# Patient Record
Sex: Male | Born: 1970 | Race: White | Hispanic: No | Marital: Married | Smoking: Never smoker
Health system: Southern US, Community
[De-identification: ages and names within clinical notes are randomized; demographics above are authoritative.]

## PROBLEM LIST (undated history)

## (undated) DIAGNOSIS — T7840XA Allergy, unspecified, initial encounter: Secondary | ICD-10-CM

## (undated) DIAGNOSIS — R600 Localized edema: Secondary | ICD-10-CM

## (undated) DIAGNOSIS — C4492 Squamous cell carcinoma of skin, unspecified: Secondary | ICD-10-CM

## (undated) DIAGNOSIS — N200 Calculus of kidney: Secondary | ICD-10-CM

## (undated) DIAGNOSIS — Z803 Family history of malignant neoplasm of breast: Secondary | ICD-10-CM

## (undated) DIAGNOSIS — Z8041 Family history of malignant neoplasm of ovary: Secondary | ICD-10-CM

## (undated) DIAGNOSIS — Z87442 Personal history of urinary calculi: Secondary | ICD-10-CM

## (undated) DIAGNOSIS — R5383 Other fatigue: Secondary | ICD-10-CM

## (undated) DIAGNOSIS — J309 Allergic rhinitis, unspecified: Secondary | ICD-10-CM

## (undated) DIAGNOSIS — L719 Rosacea, unspecified: Secondary | ICD-10-CM

## (undated) DIAGNOSIS — G589 Mononeuropathy, unspecified: Secondary | ICD-10-CM

## (undated) DIAGNOSIS — M25561 Pain in right knee: Secondary | ICD-10-CM

## (undated) DIAGNOSIS — S149XXA Injury of unspecified nerves of neck, initial encounter: Secondary | ICD-10-CM

## (undated) DIAGNOSIS — Z8489 Family history of other specified conditions: Secondary | ICD-10-CM

## (undated) DIAGNOSIS — G4730 Sleep apnea, unspecified: Secondary | ICD-10-CM

## (undated) HISTORY — DX: Rosacea, unspecified: L71.9

## (undated) HISTORY — DX: Mononeuropathy, unspecified: G58.9

## (undated) HISTORY — DX: Calculus of kidney: N20.0

## (undated) HISTORY — DX: Other fatigue: R53.83

## (undated) HISTORY — PX: LITHOTRIPSY: SUR834

## (undated) HISTORY — DX: Allergic rhinitis, unspecified: J30.9

## (undated) HISTORY — DX: Allergy, unspecified, initial encounter: T78.40XA

## (undated) HISTORY — PX: OTHER SURGICAL HISTORY: SHX169

## (undated) HISTORY — DX: Pain in right knee: M25.561

## (undated) HISTORY — DX: Squamous cell carcinoma of skin, unspecified: C44.92

## (undated) HISTORY — DX: Localized edema: R60.0

## (undated) HISTORY — DX: Family history of malignant neoplasm of breast: Z80.3

## (undated) HISTORY — DX: Sleep apnea, unspecified: G47.30

## (undated) HISTORY — DX: Family history of malignant neoplasm of ovary: Z80.41

## (undated) HISTORY — DX: Injury of unspecified nerves of neck, initial encounter: S14.9XXA

---

## 2012-11-20 ENCOUNTER — Ambulatory Visit (INDEPENDENT_AMBULATORY_CARE_PROVIDER_SITE_OTHER): Payer: Self-pay | Admitting: Internal Medicine

## 2012-11-20 ENCOUNTER — Encounter: Payer: Self-pay | Admitting: Internal Medicine

## 2012-11-20 VITALS — BP 122/72 | HR 62 | Temp 98.2°F | Ht 71.0 in | Wt 218.0 lb

## 2012-11-20 DIAGNOSIS — Z0000 Encounter for general adult medical examination without abnormal findings: Secondary | ICD-10-CM | POA: Insufficient documentation

## 2012-11-20 LAB — COMPREHENSIVE METABOLIC PANEL
ALT: 31 U/L (ref 0–53)
AST: 33 U/L (ref 0–37)
Albumin: 4.2 g/dL (ref 3.5–5.2)
Alkaline Phosphatase: 61 U/L (ref 39–117)
BUN: 15 mg/dL (ref 6–23)
CO2: 25 mEq/L (ref 19–32)
Calcium: 8.8 mg/dL (ref 8.4–10.5)
Chloride: 105 mEq/L (ref 96–112)
Creatinine, Ser: 1 mg/dL (ref 0.4–1.5)
GFR: 83.74 mL/min (ref >60.00)
Glucose, Bld: 90 mg/dL (ref 70–99)
Potassium: 3.8 mEq/L (ref 3.5–5.1)
Sodium: 139 mEq/L (ref 135–145)
Total Bilirubin: 0.5 mg/dL (ref 0.3–1.2)
Total Protein: 6.8 g/dL (ref 6.0–8.3)

## 2012-11-20 LAB — CBC WITH DIFFERENTIAL/PLATELET
Basophils Absolute: 0 10*3/uL (ref 0.0–0.1)
Basophils Relative: 0.4 % (ref 0.0–3.0)
Eosinophils Absolute: 0.1 10*3/uL (ref 0.0–0.7)
Eosinophils Relative: 1.9 % (ref 0.0–5.0)
HCT: 41.8 % (ref 39.0–52.0)
Hemoglobin: 14.6 g/dL (ref 13.0–17.0)
Lymphocytes Relative: 34.6 % (ref 12.0–46.0)
Lymphs Abs: 1.8 10*3/uL (ref 0.7–4.0)
MCHC: 34.8 g/dL (ref 30.0–36.0)
MCV: 89.5 fl (ref 78.0–100.0)
Monocytes Absolute: 0.3 10*3/uL (ref 0.1–1.0)
Monocytes Relative: 5.4 % (ref 3.0–12.0)
Neutro Abs: 3 10*3/uL (ref 1.4–7.7)
Neutrophils Relative %: 57.7 % (ref 43.0–77.0)
Platelets: 153 10*3/uL (ref 150.0–400.0)
RBC: 4.67 Mil/uL (ref 4.22–5.81)
RDW: 13.8 % (ref 11.5–14.6)
WBC: 5.1 10*3/uL (ref 4.5–10.5)

## 2012-11-20 LAB — LIPID PANEL
Cholesterol: 195 mg/dL (ref 0–200)
HDL: 27.2 mg/dL — ABNORMAL LOW (ref >39.00)
Total CHOL/HDL Ratio: 7
Triglycerides: 234 mg/dL — ABNORMAL HIGH (ref 0.0–149.0)
VLDL: 46.8 mg/dL — ABNORMAL HIGH (ref 0.0–40.0)

## 2012-11-20 LAB — TSH: TSH: 1.72 u[IU]/mL (ref 0.35–5.50)

## 2012-11-20 LAB — LDL CHOLESTEROL, DIRECT: Direct LDL: 122.5 mg/dL

## 2012-11-20 NOTE — Assessment & Plan Note (Addendum)
Td ~ 2009 per pt Never had a cscope Diet-exercise: discussed Labs occ drops of blood per rectum, will get a IFOB, if + or if he has anemia will need further eval, will call if sx increase  STE

## 2012-11-20 NOTE — Progress Notes (Signed)
  Subjective:    Patient ID: Cameron Valenzuela, male    DOB: April 07, 1971, 42 y.o.   MRN: [id number]  HPI CPX, concerned about FH of DM  Past Medical History  Diagnosis Date  . Rosacea   . Allergic rhinitis    Past Surgical History  Procedure Laterality Date  . Neck bipsy      beningn, in his 81s   History   Social History  . Marital Status: Married    Spouse Name: N/A    Number of Children: 1  . Years of Education: N/A   Occupational History  . hotel manager     Social History Main Topics  . Smoking status: Never Smoker   . Smokeless tobacco: Never Used  . Alcohol Use: Yes     Comment: rarely  . Drug Use: No  . Sexually Active: Not on file   Other Topics Concern  . Not on file   Social History Narrative   Diet: needs improvement   Exercise: no much lately   Family History  Problem Relation Age of Onset  . Diabetes Other     many fam members   . Hyperlipidemia Mother     M and others  . CAD Other     GF, GM (late onset)  . Colon cancer Neg Hx   . Prostate cancer Neg Hx      Review of Systems  Respiratory: Negative for cough and shortness of breath.   Cardiovascular: Negative for chest pain and leg swelling.  Gastrointestinal: Negative for abdominal pain and diarrhea.       Occ anal pain-itching, rarely sees drops of blood in the toilette paper  Genitourinary: Negative for dysuria, hematuria and difficulty urinating.  Psychiatric/Behavioral:       No depression-anxiety       Objective:   Physical Exam BP 122/72  Pulse 62  Temp(Src) 98.2 F (36.8 C) (Oral)  Ht 5\' 11"  (1.803 m)  Wt 218 lb (98.884 kg)  BMI 30.42 kg/m2  SpO2 98%  General -- alert, well-developed  Neck --no thyromegaly Lungs -- normal respiratory effort, no intercostal retractions, no accessory muscle use, and normal breath sounds.   Heart-- normal rate, regular rhythm, no murmur, and no gallop.   Abdomen--soft, non-tender, no distention, no masses, no HSM, no guarding, and no  rigidity.   Extremities-- no pretibial edema bilaterally  Neurologic-- alert & oriented X3 and strength normal in all extremities. Psych-- Cognition and judgment appear intact. Alert and cooperative with normal attention span and concentration.  not anxious appearing and not depressed appearing.       Assessment & Plan:

## 2012-11-25 ENCOUNTER — Encounter: Payer: Self-pay | Admitting: *Deleted

## 2015-01-25 ENCOUNTER — Telehealth: Payer: Self-pay | Admitting: Internal Medicine

## 2015-01-25 NOTE — Telephone Encounter (Signed)
pre visit letter mailed 01/24/15

## 2015-02-10 ENCOUNTER — Encounter: Payer: Self-pay | Admitting: *Deleted

## 2015-02-10 ENCOUNTER — Telehealth: Payer: Self-pay | Admitting: *Deleted

## 2015-02-10 NOTE — Telephone Encounter (Signed)
Pre-Visit Call completed with patient and chart updated.   Pre-Visit Info documented in Specialty Comments under [employer].    

## 2015-02-14 ENCOUNTER — Ambulatory Visit (INDEPENDENT_AMBULATORY_CARE_PROVIDER_SITE_OTHER): Payer: Managed Care, Other (non HMO) | Admitting: Internal Medicine

## 2015-02-14 ENCOUNTER — Encounter: Payer: Self-pay | Admitting: Internal Medicine

## 2015-02-14 VITALS — BP 122/74 | HR 72 | Temp 98.2°F | Ht 71.0 in | Wt 235.1 lb

## 2015-02-14 DIAGNOSIS — M545 Low back pain: Secondary | ICD-10-CM

## 2015-02-14 DIAGNOSIS — E785 Hyperlipidemia, unspecified: Secondary | ICD-10-CM

## 2015-02-14 DIAGNOSIS — Z23 Encounter for immunization: Secondary | ICD-10-CM

## 2015-02-14 DIAGNOSIS — Z0000 Encounter for general adult medical examination without abnormal findings: Secondary | ICD-10-CM

## 2015-02-14 NOTE — Progress Notes (Signed)
Subjective:    Patient ID: Cameron Valenzuela, male    DOB: Dec 31, 1970, 44 y.o.   MRN: [id number]  DOS:  02/14/2015 Type of visit - description : CPX Interval history:  In general doing well.  Review of Systems  Constitutional: No fever. No chills. No unexplained wt changes. No unusual sweats  HEENT: No dental problems, no ear discharge, no facial swelling, no voice changes. No eye discharge, no eye  redness , no  intolerance to light   Respiratory: No wheezing , no  difficulty breathing. No cough , no mucus production  Cardiovascular: No CP, no leg swelling , no  Palpitations  GI: no nausea, no vomiting, no diarrhea , no  abdominal pain.  No blood in the stools. No dysphagia, no odynophagia    Endocrine: No polyphagia, no polyuria , no polydipsia  GU:  For 2 weeks in July he had left low back pain with some radiation to the testicular area, symptoms resolved after he started to drink more water. Denies any rash, groin swelling @ the groin. no dysuria, gross hematuria. Pain did not change with movements.  No urinary urgency, no frequency.  Musculoskeletal: No joint swellings or unusual aches or pains  Skin: No change in the color of the skin, palor , no  Rash  Allergic, immunologic: No environmental allergies , no  food allergies  Neurological: No dizziness no  syncope. No headaches. No diplopia, no slurred, no slurred speech, no motor deficits, no facial  Numbness  Hematological: No enlarged lymph nodes, no easy bruising , no unusual bleedings  Psychiatry: No suicidal ideas, no hallucinations, no beavior problems, no confusion.  No unusual/severe anxiety, no depression  Past Medical History  Diagnosis Date  . Rosacea   . Allergic rhinitis   . Allergy     seasonal    Past Surgical History  Procedure Laterality Date  . Neck bipsy      beningn, in his 70s    History   Social History  . Marital Status: Married    Spouse Name: N/A  . Number of Children: 1  .  Years of Education: N/A   Occupational History  . hotel manager     Social History Main Topics  . Smoking status: Never Smoker   . Smokeless tobacco: Never Used  . Alcohol Use: Yes     Comment: rarely  . Drug Use: No  . Sexual Activity: Not on file   Other Topics Concern  . Not on file   Social History Narrative   Household-- pt, wife, child and mother in law     Family History  Problem Relation Age of Onset  . Diabetes Other     many fam members   . Hyperlipidemia Mother     M and others  . Colon cancer Neg Hx   . Prostate cancer Neg Hx   . Diabetes Father   . COPD Father   . Heart disease Father     ?  [name] Heart disease Maternal Grandfather   . Heart disease Paternal Grandmother        Medication List    Notice  As of 02/14/2015  4:48 PM   You have not been prescribed any medications.         Objective:   Physical Exam BP 122/74 mmHg  Pulse 72  Temp(Src) 98.2 F (36.8 C) (Oral)  Ht 5\' 11"  (1.803 m)  Wt 235 lb 2 oz (106.652 kg)  BMI 32.81 kg/m2  SpO2 96% General:   Well developed, well nourished . NAD.  HEENT:  Normocephalic . Face symmetric, atraumatic Neck: No thyromegaly Lungs:  CTA B Normal respiratory effort, no intercostal retractions, no accessory muscle use. Heart: RRR,  no murmur.  no pretibial edema bilaterally  MSK: No TTP at the lumbar spine Abdomen:  Not distended, soft, non-tender. No rebound or rigidity. No mass,organomegaly Skin: Not pale. Not jaundice Neurologic:  alert & oriented X3.  Speech normal, gait appropriate for age and unassisted Psych--  Cognition and judgment appear intact.  Cooperative with normal attention span and concentration.  Behavior appropriate. No anxious or depressed appearing.    Assessment & Plan:

## 2015-02-14 NOTE — Patient Instructions (Signed)
Get your blood work before you leave . We also need a urine sample  Please consider counting calories using [employer]

## 2015-02-14 NOTE — Progress Notes (Signed)
Pre visit review using our clinic review tool, if applicable. No additional management support is needed unless otherwise documented below in the visit note. 

## 2015-02-14 NOTE — Assessment & Plan Note (Signed)
Td today Colon cancer screening: Not indicated, no history of previous colonoscopy, no family history Prostate cancer screening: Not indicated   Diet-exercise: Extensive discussion, recommend at least 3 hours of exercise weekly, calorie counting? Labs  Other issues: Back pain, resolved, check a UA

## 2015-02-15 LAB — URINALYSIS, ROUTINE W REFLEX MICROSCOPIC
Bilirubin Urine: NEGATIVE
Ketones, ur: NEGATIVE
Leukocytes, UA: NEGATIVE
Nitrite: NEGATIVE
Specific Gravity, Urine: 1.01 (ref 1.000–1.030)
Total Protein, Urine: NEGATIVE
Urine Glucose: NEGATIVE
Urobilinogen, UA: 0.2 (ref 0.0–1.0)
pH: 5.5 (ref 5.0–8.0)

## 2015-02-15 LAB — LDL CHOLESTEROL, DIRECT: Direct LDL: 87 mg/dL

## 2015-02-15 LAB — COMPREHENSIVE METABOLIC PANEL
ALT: 33 U/L (ref 0–53)
AST: 24 U/L (ref 0–37)
Albumin: 4.5 g/dL (ref 3.5–5.2)
Alkaline Phosphatase: 66 U/L (ref 39–117)
BUN: 12 mg/dL (ref 6–23)
CO2: 29 mEq/L (ref 19–32)
Calcium: 9.2 mg/dL (ref 8.4–10.5)
Chloride: 103 mEq/L (ref 96–112)
Creatinine, Ser: 0.99 mg/dL (ref 0.40–1.50)
GFR: 86.76 mL/min (ref >60.00)
Glucose, Bld: 78 mg/dL (ref 70–99)
Potassium: 3.8 mEq/L (ref 3.5–5.1)
Sodium: 138 mEq/L (ref 135–145)
Total Bilirubin: 0.6 mg/dL (ref 0.2–1.2)
Total Protein: 7.1 g/dL (ref 6.0–8.3)

## 2015-02-15 LAB — CBC WITH DIFFERENTIAL/PLATELET
Basophils Absolute: 0 10*3/uL (ref 0.0–0.1)
Basophils Relative: 0.5 % (ref 0.0–3.0)
Eosinophils Absolute: 0.1 10*3/uL (ref 0.0–0.7)
Eosinophils Relative: 1.7 % (ref 0.0–5.0)
HCT: 43.3 % (ref 39.0–52.0)
Hemoglobin: 14.8 g/dL (ref 13.0–17.0)
Lymphocytes Relative: 37.3 % (ref 12.0–46.0)
Lymphs Abs: 2.3 10*3/uL (ref 0.7–4.0)
MCHC: 34.3 g/dL (ref 30.0–36.0)
MCV: 91.2 fl (ref 78.0–100.0)
Monocytes Absolute: 0.3 10*3/uL (ref 0.1–1.0)
Monocytes Relative: 5.2 % (ref 3.0–12.0)
Neutro Abs: 3.4 10*3/uL (ref 1.4–7.7)
Neutrophils Relative %: 55.3 % (ref 43.0–77.0)
Platelets: 166 10*3/uL (ref 150.0–400.0)
RBC: 4.74 Mil/uL (ref 4.22–5.81)
RDW: 14.4 % (ref 11.5–15.5)
WBC: 6.1 10*3/uL (ref 4.0–10.5)

## 2015-02-15 LAB — LIPID PANEL
Cholesterol: 204 mg/dL — ABNORMAL HIGH (ref 0–200)
HDL: 28.8 mg/dL — ABNORMAL LOW (ref >39.00)
Total CHOL/HDL Ratio: 7
Triglycerides: 463 mg/dL — ABNORMAL HIGH (ref 0.0–149.0)

## 2015-02-15 LAB — HIV ANTIBODY (ROUTINE TESTING W REFLEX): HIV 1&2 Ab, 4th Generation: NONREACTIVE

## 2015-02-15 LAB — TSH: TSH: 3.7 u[IU]/mL (ref 0.35–4.50)

## 2015-02-21 NOTE — Addendum Note (Signed)
Addended by: Wilfrid Lund on: 02/21/2015 03:16 PM   Modules accepted: Orders

## 2015-07-17 DIAGNOSIS — C4492 Squamous cell carcinoma of skin, unspecified: Secondary | ICD-10-CM

## 2015-07-17 HISTORY — DX: Squamous cell carcinoma of skin, unspecified: C44.92

## 2015-09-26 ENCOUNTER — Telehealth: Payer: Self-pay | Admitting: Internal Medicine

## 2015-09-26 NOTE — Telephone Encounter (Signed)
LVM inquiring if patient received flu shot  °

## 2016-02-10 ENCOUNTER — Telehealth: Payer: Self-pay

## 2016-02-10 NOTE — Telephone Encounter (Signed)
Okay to use 2-15 minute slots for CPE at [employer]. Thank you.

## 2016-02-10 NOTE — Telephone Encounter (Signed)
-----   Message from Oneta Rack sent at 02/10/2016  8:11 AM EDT ----- Regarding: CPE  Contact: [phone] Patient would like to have her physical before October but after August 10th please advise if same day can be utilize?

## 2016-02-14 NOTE — Telephone Encounter (Signed)
Patient scheduled physical for 03/12/2016 at 1pm.

## 2016-02-17 ENCOUNTER — Encounter: Payer: Managed Care, Other (non HMO) | Admitting: Internal Medicine

## 2016-03-12 ENCOUNTER — Encounter: Payer: Self-pay | Admitting: Internal Medicine

## 2016-03-12 ENCOUNTER — Ambulatory Visit (INDEPENDENT_AMBULATORY_CARE_PROVIDER_SITE_OTHER): Payer: Managed Care, Other (non HMO) | Admitting: Internal Medicine

## 2016-03-12 VITALS — BP 102/68 | HR 65 | Temp 98.1°F | Resp 12 | Ht 71.0 in | Wt 220.5 lb

## 2016-03-12 DIAGNOSIS — Z0000 Encounter for general adult medical examination without abnormal findings: Secondary | ICD-10-CM

## 2016-03-12 DIAGNOSIS — Z09 Encounter for follow-up examination after completed treatment for conditions other than malignant neoplasm: Secondary | ICD-10-CM | POA: Insufficient documentation

## 2016-03-12 DIAGNOSIS — R109 Unspecified abdominal pain: Secondary | ICD-10-CM | POA: Diagnosis not present

## 2016-03-12 LAB — URINALYSIS, ROUTINE W REFLEX MICROSCOPIC
Bilirubin Urine: NEGATIVE
Ketones, ur: NEGATIVE
Leukocytes, UA: NEGATIVE
Nitrite: NEGATIVE
Specific Gravity, Urine: 1.02 (ref 1.000–1.030)
Urine Glucose: NEGATIVE
Urobilinogen, UA: 1 (ref 0.0–1.0)
pH: 6 (ref 5.0–8.0)

## 2016-03-12 LAB — LIPID PANEL
Cholesterol: 212 mg/dL — ABNORMAL HIGH (ref 0–200)
HDL: 27.4 mg/dL — ABNORMAL LOW (ref >39.00)
LDL Cholesterol: 147 mg/dL — ABNORMAL HIGH (ref 0–99)
NonHDL: 184.45
Total CHOL/HDL Ratio: 8
Triglycerides: 187 mg/dL — ABNORMAL HIGH (ref 0.0–149.0)
VLDL: 37.4 mg/dL (ref 0.0–40.0)

## 2016-03-12 LAB — TSH: TSH: 2.25 u[IU]/mL (ref 0.35–4.50)

## 2016-03-12 NOTE — Assessment & Plan Note (Signed)
Left back- abdominal pain: Had  similar c/o last year, at that time the discomfort was getting closer to the GU area; now pain is mostly by the left side of the back and left lower quadrant. ROS is actually negative otherwise. MSK issue? Others?. Since this is an ongoing sx will start with an abdominal ultrasound particularly to look into the kidney. Also get a UA, urine culture. If test negative and symptoms continue will consider CT. RTC 1 year

## 2016-03-12 NOTE — Progress Notes (Signed)
Pre visit review using our clinic review tool, if applicable. No additional management support is needed unless otherwise documented below in the visit note. 

## 2016-03-12 NOTE — Assessment & Plan Note (Addendum)
Td 2016 Colon cancer screening: Not indicated, no history of previous colonoscopy, no family history Prostate cancer screening: Not indicated   Diet-exercise: Doing better. Labs reviewed, will check FLP, TSH, UA, urine culture

## 2016-03-12 NOTE — Progress Notes (Signed)
Subjective:    Patient ID: Cameron Valenzuela, male    DOB: 1971-04-13, 45 y.o.   MRN: [id number]  DOS:  03/12/2016 Type of visit - description : Complete physical exam Interval history: In general feeling well, trying to be more active and eat healthier. Has lost some weight.  Review of Systems Constitutional: No fever. No chills. No unexplained wt changes. No unusual sweats  HEENT: No dental problems, no ear discharge, no facial swelling, no voice changes. No eye discharge, no eye  redness , no  intolerance to light   Respiratory: No wheezing , no  difficulty breathing. No cough , no mucus production  Cardiovascular: No CP, no leg swelling , no  Palpitations  GI: no nausea, no vomiting, no diarrhea . Continue with the ill-defined pain at the left side: From the spine radiated anteriorly to the left lower quadrant. Otherwise review systems is negative. Pain is somewhat better when he pushed his abdomen up No blood in the stools. No dysphagia, no odynophagia    Endocrine: No polyphagia, no polyuria , no polydipsia  GU: No dysuria, gross hematuria, difficulty urinating. No urinary urgency, no frequency.  Musculoskeletal: No joint swellings or unusual aches or pains  Skin: No change in the color of the skin, palor , no  Rash  Allergic, immunologic: No environmental allergies , no  food allergies  Neurological: No dizziness no  syncope. No headaches. No diplopia, no slurred, no slurred speech, no motor deficits, no facial  Numbness  Hematological: No enlarged lymph nodes, no easy bruising , no unusual bleedings  Psychiatry: No suicidal ideas, no hallucinations, no beavior problems, no confusion.  No unusual/severe anxiety, no depression   Past Medical History:  Diagnosis Date  . Allergic rhinitis   . Allergy    seasonal  . Rosacea     Past Surgical History:  Procedure Laterality Date  . neck bipsy     beningn, in his 55s    Social History   Social History  .  Marital status: Married    Spouse name: Vaughan Basta  . Number of children: 1  . Years of education: N/A   Occupational History  . hotel manager     Social History Main Topics  . Smoking status: Never Smoker  . Smokeless tobacco: Never Used  . Alcohol use Yes     Comment: rarely  . Drug use: No  . Sexual activity: Not on file   Other Topics Concern  . Not on file   Social History Narrative   Household-- pt, wife, child and mother in law     Family History  Problem Relation Age of Onset  . Diabetes Father   . COPD Father   . Heart disease Father     ?  [name] Heart disease Maternal Grandfather   . Heart disease Paternal Grandmother   . Diabetes Other     many fam members   . Hyperlipidemia Mother     M and others  . Colon cancer Neg Hx   . Prostate cancer Neg Hx        Medication List    as of 03/12/2016  5:56 PM   You have not been prescribed any medications.        Objective:   Physical Exam  Abdominal:    Musculoskeletal:       Arms:  BP 102/68 (BP Location: Left Arm, Patient Position: Sitting, Cuff Size: Normal)   Pulse 65   Temp 98.1 F (  36.7 C) (Oral)   Resp 12   Ht 5\' 11"  (1.803 m)   Wt 220 lb 8 oz (100 kg)   SpO2 96%   BMI 30.75 kg/m   General:   Well developed, well nourished . NAD.  Neck: No  thyromegaly  HEENT:  Normocephalic . Face symmetric, atraumatic Lungs:  CTA B Normal respiratory effort, no intercostal retractions, no accessory muscle use. Heart: RRR,  no murmur.  No pretibial edema bilaterally  Abdomen:  Not distended, soft, slightly TTP without mass or rebound at the left side. See graphic.   Skin: Exposed areas without rash. Not pale. Not jaundice GU: Penis and scrotal contents normal. No inguinal hernias. Neurologic:  alert & oriented X3.  Speech normal, gait appropriate for age and unassisted Strength symmetric and appropriate for age.  Psych: Cognition and judgment appear intact.  Cooperative with normal attention span  and concentration.  Behavior appropriate. No anxious or depressed appearing.    Assessment & Plan:   Assessment Seasonal allergies Rosacea  PLAN: Left back- abdominal pain: Had  similar c/o last year, at that time the discomfort was getting closer to the GU area; now pain is mostly by the left side of the back and left lower quadrant. ROS is actually negative otherwise. MSK issue? Others?. Since this is an ongoing sx will start with an abdominal ultrasound particularly to look into the kidney. Also get a UA, urine culture. If test negative and symptoms continue will consider CT. RTC 1 year

## 2016-03-12 NOTE — Patient Instructions (Signed)
GO TO THE LAB : Get the blood work and a urine sample    GO TO THE FRONT DESK Schedule your next appointment for a physical exam in 1 year   If the pain is not gradually improving, please let me know

## 2016-03-13 LAB — URINE CULTURE: Organism ID, Bacteria: NO GROWTH

## 2016-03-17 ENCOUNTER — Ambulatory Visit (HOSPITAL_BASED_OUTPATIENT_CLINIC_OR_DEPARTMENT_OTHER)
Admission: RE | Admit: 2016-03-17 | Discharge: 2016-03-17 | Disposition: A | Discharge status: Home / Self Care | Payer: Managed Care, Other (non HMO) | Source: Ambulatory Visit | Attending: Internal Medicine | Admitting: Internal Medicine

## 2016-03-17 DIAGNOSIS — N281 Cyst of kidney, acquired: Secondary | ICD-10-CM | POA: Insufficient documentation

## 2016-03-17 DIAGNOSIS — R109 Unspecified abdominal pain: Secondary | ICD-10-CM | POA: Insufficient documentation

## 2016-03-17 DIAGNOSIS — K760 Fatty (change of) liver, not elsewhere classified: Secondary | ICD-10-CM | POA: Diagnosis not present

## 2016-04-24 ENCOUNTER — Encounter: Payer: Managed Care, Other (non HMO) | Admitting: Internal Medicine

## 2016-07-11 ENCOUNTER — Emergency Department (HOSPITAL_BASED_OUTPATIENT_CLINIC_OR_DEPARTMENT_OTHER): Payer: Managed Care, Other (non HMO)

## 2016-07-11 ENCOUNTER — Emergency Department (HOSPITAL_BASED_OUTPATIENT_CLINIC_OR_DEPARTMENT_OTHER)
Admission: EM | Admit: 2016-07-11 | Discharge: 2016-07-11 | Disposition: A | Discharge status: Home / Self Care | Payer: Managed Care, Other (non HMO) | Attending: Emergency Medicine | Admitting: Emergency Medicine

## 2016-07-11 ENCOUNTER — Encounter (HOSPITAL_BASED_OUTPATIENT_CLINIC_OR_DEPARTMENT_OTHER): Payer: Self-pay | Admitting: *Deleted

## 2016-07-11 DIAGNOSIS — R1032 Left lower quadrant pain: Secondary | ICD-10-CM | POA: Diagnosis present

## 2016-07-11 DIAGNOSIS — N23 Unspecified renal colic: Secondary | ICD-10-CM

## 2016-07-11 DIAGNOSIS — N200 Calculus of kidney: Secondary | ICD-10-CM

## 2016-07-11 LAB — CBC WITH DIFFERENTIAL/PLATELET
Basophils Absolute: 0 10*3/uL (ref 0.0–0.1)
Basophils Relative: 0 %
Eosinophils Absolute: 0 10*3/uL (ref 0.0–0.7)
Eosinophils Relative: 0 %
HCT: 42.5 % (ref 39.0–52.0)
Hemoglobin: 14.3 g/dL (ref 13.0–17.0)
Lymphocytes Relative: 9 %
Lymphs Abs: 1.1 10*3/uL (ref 0.7–4.0)
MCH: 31.1 pg (ref 26.0–34.0)
MCHC: 33.6 g/dL (ref 30.0–36.0)
MCV: 92.4 fL (ref 78.0–100.0)
Monocytes Absolute: 0.3 10*3/uL (ref 0.1–1.0)
Monocytes Relative: 2 %
Neutro Abs: 11.1 10*3/uL — ABNORMAL HIGH (ref 1.7–7.7)
Neutrophils Relative %: 89 %
Platelets: 207 10*3/uL (ref 150–400)
RBC: 4.6 MIL/uL (ref 4.22–5.81)
RDW: 14.5 % (ref 11.5–15.5)
WBC: 12.5 10*3/uL — ABNORMAL HIGH (ref 4.0–10.5)

## 2016-07-11 LAB — URINALYSIS, MICROSCOPIC (REFLEX): WBC, UA: NONE SEEN WBC/hpf (ref 0–5)

## 2016-07-11 LAB — URINALYSIS, ROUTINE W REFLEX MICROSCOPIC
Bilirubin Urine: NEGATIVE
Glucose, UA: NEGATIVE mg/dL
Ketones, ur: NEGATIVE mg/dL
Leukocytes, UA: NEGATIVE
Nitrite: NEGATIVE
Protein, ur: 30 mg/dL — AB
Specific Gravity, Urine: 1.025 (ref 1.005–1.030)
pH: 5 (ref 5.0–8.0)

## 2016-07-11 LAB — COMPREHENSIVE METABOLIC PANEL
ALT: 33 U/L (ref 17–63)
AST: 30 U/L (ref 15–41)
Albumin: 4.6 g/dL (ref 3.5–5.0)
Alkaline Phosphatase: 59 U/L (ref 38–126)
Anion gap: 8 (ref 5–15)
BUN: 20 mg/dL (ref 6–20)
CO2: 24 mmol/L (ref 22–32)
Calcium: 9 mg/dL (ref 8.9–10.3)
Chloride: 106 mmol/L (ref 101–111)
Creatinine, Ser: 1.04 mg/dL (ref 0.61–1.24)
GFR calc Af Amer: >60 mL/min (ref >60)
GFR calc non Af Amer: >60 mL/min (ref >60)
Glucose, Bld: 158 mg/dL — ABNORMAL HIGH (ref 65–99)
Potassium: 4 mmol/L (ref 3.5–5.1)
Sodium: 138 mmol/L (ref 135–145)
Total Bilirubin: 0.6 mg/dL (ref 0.3–1.2)
Total Protein: 7.3 g/dL (ref 6.5–8.1)

## 2016-07-11 MED ORDER — ONDANSETRON HCL 4 MG/2ML IJ SOLN
4.0000 mg | INTRAMUSCULAR | Status: DC | PRN
  Administered 2016-07-11: 4 mg via INTRAVENOUS
  Filled 2016-07-11: qty 2

## 2016-07-11 MED ORDER — OXYCODONE-ACETAMINOPHEN 5-325 MG PO TABS: 1.0000 | ORAL_TABLET | Freq: Four times a day (QID) | ORAL | 0 refills | Status: DC | PRN

## 2016-07-11 MED ORDER — ONDANSETRON 4 MG PO TBDP: 4.0000 mg | ORAL_TABLET | Freq: Three times a day (TID) | ORAL | 0 refills | Status: DC | PRN

## 2016-07-11 MED ORDER — KETOROLAC TROMETHAMINE 30 MG/ML IJ SOLN
30.0000 mg | Freq: Once | INTRAMUSCULAR | Status: AC
  Administered 2016-07-11: 30 mg via INTRAVENOUS
  Filled 2016-07-11: qty 1

## 2016-07-11 MED ORDER — IBUPROFEN 800 MG PO TABS: 800.0000 mg | ORAL_TABLET | Freq: Four times a day (QID) | ORAL | 0 refills | Status: AC

## 2016-07-11 MED ORDER — FENTANYL CITRATE (PF) 100 MCG/2ML IJ SOLN
50.0000 ug | INTRAMUSCULAR | Status: DC | PRN
  Administered 2016-07-11: 50 ug via INTRAVENOUS
  Filled 2016-07-11: qty 2

## 2016-07-11 NOTE — ED Notes (Signed)
Pt given d/c instructions as per chart. Verbalizes understanding. No questions. Rx  X 3

## 2016-07-11 NOTE — ED Notes (Signed)
ED Provider at bedside. 

## 2016-07-11 NOTE — ED Notes (Signed)
Pt sitting up on stretcher texting. No pain at present.

## 2016-07-11 NOTE — ED Notes (Signed)
Pt states he was at work this a.m. and developed left flank pain and nausea. Abd feels bloated. +hematuria

## 2016-07-11 NOTE — ED Triage Notes (Signed)
Pt c/o adb pain x 6 hrs and vomiting x 1 hr

## 2016-07-11 NOTE — ED Notes (Signed)
Patient transported to CT 

## 2016-07-11 NOTE — ED Provider Notes (Signed)
[place] [facility] Provider Note   CSN: [id number] Arrival date & time: 07/11/16  1445     History   Chief Complaint Chief Complaint  Patient presents with  . Abdominal Pain    HPI Cameron Valenzuela is a 45 y.o. male.  The history is provided by the patient.  Flank Pain  This is a recurrent problem. The current episode started 6 to 12 hours ago. The problem occurs constantly. The problem has been rapidly worsening. Associated symptoms include abdominal pain (left lower). Nothing aggravates the symptoms. Nothing relieves the symptoms. He has tried nothing for the symptoms.    Past Medical History:  Diagnosis Date  . Allergic rhinitis   . Allergy    seasonal  . Rosacea     Patient Active Problem List   Diagnosis Date Noted  . PCP NOTES >>>>>>>>>>>>>>>> 03/12/2016  . Annual physical exam 11/20/2012    Past Surgical History:  Procedure Laterality Date  . neck bipsy     beningn, in his 31s       Home Medications    Prior to Admission medications   Medication Sig Start Date End Date Taking? Authorizing Provider  ibuprofen (ADVIL,MOTRIN) 800 MG tablet Take 1 tablet (800 mg total) by mouth every 6 (six) hours. 07/11/16 07/16/16  Leo Grosser, MD  ondansetron (ZOFRAN ODT) 4 MG disintegrating tablet Take 1 tablet (4 mg total) by mouth every 8 (eight) hours as needed for nausea or vomiting. 07/11/16   Leo Grosser, MD  oxyCODONE-acetaminophen (PERCOCET/ROXICET) 5-325 MG tablet Take 1 tablet by mouth every 6 (six) hours as needed for severe pain. 07/11/16   Leo Grosser, MD    Family History Family History  Problem Relation Age of Onset  . Diabetes Father   . COPD Father   . Heart disease Father     ?  [name] Heart disease Maternal Grandfather   . Heart disease Paternal Grandmother   . Diabetes Other     many fam members   . Hyperlipidemia Mother     M and others  . Colon cancer Neg Hx   . Prostate cancer Neg Hx     Social History Social History    Substance Use Topics  . Smoking status: Never Smoker  . Smokeless tobacco: Never Used  . Alcohol use Yes     Comment: rarely     Allergies   Patient has no known allergies.   Review of Systems Review of Systems  Gastrointestinal: Positive for abdominal pain (left lower).  Genitourinary: Positive for flank pain.  All other systems reviewed and are negative.    Physical Exam Updated Vital Signs BP 142/86 (BP Location: Left Arm)   Pulse 62   Temp 98.3 F (36.8 C) (Oral)   Resp 20   Ht 5\' 10"  (1.778 m)   Wt 210 lb (95.3 kg)   SpO2 100%   BMI 30.13 kg/m   Physical Exam  Constitutional: He is oriented to person, place, and time. He appears well-developed and well-nourished. No distress.  HENT:  Head: Normocephalic and atraumatic.  Nose: Nose normal.  Eyes: Conjunctivae are normal.  Neck: Neck supple. No tracheal deviation present.  Cardiovascular: Normal rate and regular rhythm.   Pulmonary/Chest: Effort normal. No respiratory distress.  Abdominal: Soft. He exhibits no distension. There is tenderness (LLQ and left flank).  CVA tenderness on left  Neurological: He is alert and oriented to person, place, and time.  Skin: Skin is warm and dry.  Psychiatric: He  has a normal mood and affect.     ED Treatments / Results  Labs (all labs ordered are listed, but only abnormal results are displayed) Labs Reviewed  URINALYSIS, ROUTINE W REFLEX MICROSCOPIC - Abnormal; Notable for the following:       Result Value   APPearance CLOUDY (*)    Hgb urine dipstick LARGE (*)    Protein, ur 30 (*)    All other components within normal limits  URINALYSIS, MICROSCOPIC (REFLEX) - Abnormal; Notable for the following:    Bacteria, UA RARE (*)    Squamous Epithelial / LPF 0-5 (*)    All other components within normal limits  CBC WITH DIFFERENTIAL/PLATELET - Abnormal; Notable for the following:    WBC 12.5 (*)    Neutro Abs 11.1 (*)    All other components within normal limits   COMPREHENSIVE METABOLIC PANEL - Abnormal; Notable for the following:    Glucose, Bld 158 (*)    All other components within normal limits  URINE CULTURE    EKG  EKG Interpretation None       Radiology Ct Renal Stone Study  Result Date: 07/11/2016 CLINICAL DATA:  Left flank pain and hematuria EXAM: CT ABDOMEN AND PELVIS WITHOUT CONTRAST TECHNIQUE: Multidetector CT imaging of the abdomen and pelvis was performed following the standard protocol without IV contrast. COMPARISON:  None. FINDINGS: Lower chest: Lung bases clear.  Heart size within normal limits. Hepatobiliary: Fatty infiltration of liver without focal lesion. Gallbladder and bile ducts normal. Pancreas: Negative Spleen: Negative Adrenals/Urinary Tract: 12 x 8 mm stone left renal pelvis. There is mild dilatation of the renal pelvis with mild surrounding edema suggesting intermittent obstruction. Left ureter nondilated. Nonobstructing 2 x 3 mm stone left lower pole. 15 mm and 20 mm left renal cyst. Negative for hydronephrosis on the right. There is however a 2 x 4 mm stone in the distal right ureter. No other right renal calculi. Stomach/Bowel: Stomach and duodenum normal. Negative for bowel obstruction. Normal appendix. Negative for bowel edema or mass. Vascular/Lymphatic: Negative Reproductive: 23 mm low-density cystic structure just above the prostate likely associated with seminal vesicle. This is near the midline. Prostate mildly enlarged. Other: No free fluid.  Negative for hernia. Musculoskeletal: Negative IMPRESSION: 12 x 8 mm stone in the left renal pelvis. There is mild dilatation of pelvis with surrounding edema suggesting intermittent obstruction. Nonobstructing 2 x 3 mm stone left lower pole. 2 x 4 mm stone distal right ureter without significant obstruction. 23 mm low-density cystic structure just above the prostate likely associated with seminal vesicle. Electronically Signed   By: Franchot Gallo M.D.   On: 07/11/2016 16:56     Procedures Procedures (including critical care time)  Medications Ordered in ED Medications  ondansetron (ZOFRAN) injection 4 mg (4 mg Intravenous Given 07/11/16 1629)  fentaNYL (SUBLIMAZE) injection 50 mcg (50 mcg Intravenous Given 07/11/16 1717)  ketorolac (TORADOL) 30 MG/ML injection 30 mg (30 mg Intravenous Given 07/11/16 1632)     Initial Impression / Assessment and Plan / ED Course  I have reviewed the triage vital signs and the nursing notes.  Pertinent labs & imaging results that were available during my care of the patient were reviewed by me and considered in my medical decision making (see chart for details).  Clinical Course     45 y.o. male presents with left flank pain that became acutely worse today. He started vomiting d/t pain. Was undergoing outpatient workup with unremarkable renal US. CT was  planned if symptoms persisted and was performed revealing large proximal intermittently obstructing stone likely causing symptoms. Lower small right sided stone appears to be asymptomatic and not obstructive. Renal function is preserved. Pain and nausea well controlled here. No evidence of infection. Provided referral to urology as this size stone will likely require some type of intervention. Pain meds and nausea meds given for home control of symptoms until able to establish follow up.   Final Clinical Impressions(s) / ED Diagnoses   Final diagnoses:  Kidney stone on left side  Renal colic    New Prescriptions New Prescriptions   IBUPROFEN (ADVIL,MOTRIN) 800 MG TABLET    Take 1 tablet (800 mg total) by mouth every 6 (six) hours.   ONDANSETRON (ZOFRAN ODT) 4 MG DISINTEGRATING TABLET    Take 1 tablet (4 mg total) by mouth every 8 (eight) hours as needed for nausea or vomiting.   OXYCODONE-ACETAMINOPHEN (PERCOCET/ROXICET) 5-325 MG TABLET    Take 1 tablet by mouth every 6 (six) hours as needed for severe pain.     Leo Grosser, MD 07/12/16 [id number]

## 2016-07-12 ENCOUNTER — Other Ambulatory Visit: Payer: Self-pay | Admitting: Urology

## 2016-07-12 LAB — URINE CULTURE: Culture: NO GROWTH

## 2016-07-17 ENCOUNTER — Other Ambulatory Visit: Payer: Self-pay | Admitting: Urology

## 2016-07-24 NOTE — Patient Instructions (Signed)
Cameron Valenzuela  07/24/2016   Your procedure is scheduled on: 07/30/2016    Report to [facility] Main  Entrance take [place]  elevators to 3rd floor to  [place] at   [place] AM.  Call this number if you have problems the morning of surgery [phone]   Remember: ONLY 1 PERSON MAY GO WITH YOU TO SHORT STAY TO GET  READY MORNING OF [place].  Do not eat food or drink liquids :After Midnight.     Take these medicines the morning of surgery with A SIP OF WATER: none                                 You may not have any metal on your body including hair pins and              piercings  Do not wear jewelry,  lotions, powders or perfumes, deodorant                           Men may shave face and neck.   Do not bring valuables to the hospital. [place].  Contacts, dentures or bridgework may not be worn into surgery.       Patients discharged the day of surgery will not be allowed to drive home.  Name and phone number of your driver:  Special Instructions: N/A              Please read over the following fact sheets you were given: _____________________________________________________________________             [facility] - Preparing for Surgery Before surgery, you can play an important role.  Because skin is not sterile, your skin needs to be as free of germs as possible.  You can reduce the number of germs on your skin by washing with CHG (chlorahexidine gluconate) soap before surgery.  CHG is an antiseptic cleaner which kills germs and bonds with the skin to continue killing germs even after washing. Please DO NOT use if you have an allergy to CHG or antibacterial soaps.  If your skin becomes reddened/irritated stop using the CHG and inform your nurse when you arrive at Short Stay. Do not shave (including legs and underarms) for at least 48 hours prior to the first CHG shower.  You may shave your  face/neck. Please follow these instructions carefully:  1.  Shower with CHG Soap the night before surgery and the  morning of Surgery.  2.  If you choose to wash your hair, wash your hair first as usual with your  normal  shampoo.  3.  After you shampoo, rinse your hair and body thoroughly to remove the  shampoo.                           4.  Use CHG as you would any other liquid soap.  You can apply chg directly  to the skin and wash                       Gently with a scrungie or clean washcloth.  5.  Apply  the CHG Soap to your body ONLY FROM THE NECK DOWN.   Do not use on face/ open                           Wound or open sores. Avoid contact with eyes, ears mouth and genitals (private parts).                       Wash face,  Genitals (private parts) with your normal soap.             6.  Wash thoroughly, paying special attention to the area where your surgery  will be performed.  7.  Thoroughly rinse your body with warm water from the neck down.  8.  DO NOT shower/wash with your normal soap after using and rinsing off  the CHG Soap.                9.  Pat yourself dry with a clean towel.            10.  Wear clean pajamas.            11.  Place clean sheets on your bed the night of your first shower and do not  sleep with pets. Day of Surgery : Do not apply any lotions/deodorants the morning of surgery.  Please wear clean clothes to the hospital/surgery center.  FAILURE TO FOLLOW THESE INSTRUCTIONS MAY RESULT IN THE CANCELLATION OF YOUR SURGERY PATIENT SIGNATURE_________________________________  NURSE SIGNATURE__________________________________  ________________________________________________________________________

## 2016-07-25 ENCOUNTER — Encounter (HOSPITAL_COMMUNITY): Payer: Self-pay | Admitting: *Deleted

## 2016-07-25 ENCOUNTER — Encounter (HOSPITAL_COMMUNITY): Payer: Self-pay

## 2016-07-25 ENCOUNTER — Encounter (HOSPITAL_COMMUNITY)
Admission: RE | Admit: 2016-07-25 | Discharge: 2016-07-25 | Disposition: A | Discharge status: Home / Self Care | Payer: Managed Care, Other (non HMO) | Source: Ambulatory Visit | Attending: Urology | Admitting: Urology

## 2016-07-25 DIAGNOSIS — Z01812 Encounter for preprocedural laboratory examination: Secondary | ICD-10-CM | POA: Insufficient documentation

## 2016-07-25 HISTORY — DX: Family history of other specified conditions: Z84.89

## 2016-07-25 HISTORY — DX: Personal history of urinary calculi: Z87.442

## 2016-07-25 LAB — BASIC METABOLIC PANEL
Anion gap: 5 (ref 5–15)
BUN: 20 mg/dL (ref 6–20)
CO2: 31 mmol/L (ref 22–32)
Calcium: 9.3 mg/dL (ref 8.9–10.3)
Chloride: 105 mmol/L (ref 101–111)
Creatinine, Ser: 0.98 mg/dL (ref 0.61–1.24)
GFR calc Af Amer: >60 mL/min (ref >60)
GFR calc non Af Amer: >60 mL/min (ref >60)
Glucose, Bld: 89 mg/dL (ref 65–99)
Potassium: 4.3 mmol/L (ref 3.5–5.1)
Sodium: 141 mmol/L (ref 135–145)

## 2016-07-25 LAB — CBC
HCT: 42.5 % (ref 39.0–52.0)
Hemoglobin: 14.3 g/dL (ref 13.0–17.0)
MCH: 30.6 pg (ref 26.0–34.0)
MCHC: 33.6 g/dL (ref 30.0–36.0)
MCV: 91 fL (ref 78.0–100.0)
Platelets: 184 10*3/uL (ref 150–400)
RBC: 4.67 MIL/uL (ref 4.22–5.81)
RDW: 13.9 % (ref 11.5–15.5)
WBC: 5.6 10*3/uL (ref 4.0–10.5)

## 2016-07-30 ENCOUNTER — Ambulatory Visit (HOSPITAL_COMMUNITY): Admission: RE | Admit: 2016-07-30 | Payer: Managed Care, Other (non HMO) | Source: Ambulatory Visit | Admitting: Urology

## 2016-07-30 SURGERY — LITHOTRIPSY, ESWL
Anesthesia: LOCAL | Laterality: Left

## 2016-07-30 SURGERY — CYSTOSCOPY, FLEXIBLE, WITH STENT REPLACEMENT
Anesthesia: General | Laterality: Left

## 2016-07-30 SURGERY — LITHOTRIPSY, ESWL
Anesthesia: General | Laterality: Left

## 2016-07-30 NOTE — H&P (Signed)
HPI: Cameron Valenzuela is a 46 year-old male with a left renal pelvis stone.  The problem is on the left side. His symptoms include flank pain. Patient denies having back pain, groin pain, nausea, vomiting, fever, chills, and blood in urine. He first began experiencing symptoms 07/11/2016. This is his first kidney stone.   He has never had surgical treatment for calculi in the past.   He was seen in the emergency room on 07/11/16 with intermittent pain in the left lower quadrant. The pain was severe when it occurred and constant. A CT scan was obtained which revealed a 12 x 8 mm stone in the renal pelvis with Hounsfield units of 1000 as well as a smaller stone in the lower pole of the left kidney that was nonobstructing. In addition he had a 2.5 mm stone seen in the distal right ureter on the right hand side with no right renal calculi.   He said that he had intermittently had some pain in the back for about 6 months although it had been mild. Recently though he developed increased pressure-type sensation with nausea that has also been intermittent. It did become severe and he went to the ER where he was diagnosed with his left renal pelvic stone as well as his right ureteral stone. He has no prior history of stones. He has seen some gross hematuria intermittently. He has no change in his voiding pattern. There is no family history of kidney stones. He is not having any discomfort at this time. He did undergo a renal ultrasound several months ago but apparently no stone was identified area he was found to have a renal cyst.  He passed his right distal ureteral stone last night and he caught this in his strainer.     ALLERGIES: None   MEDICATIONS: Gas-X  Ibuprofen 800 mg tablet     GU PSH: None   NON-GU PSH: None   GU PMH: None   NON-GU PMH: None   FAMILY HISTORY: 1 Daughter - Other cardiac disorder - Runs in Family Diabetes - Runs in Family   SOCIAL HISTORY: Marital Status:  Married Current Smoking Status: Patient has never smoked.   Tobacco Use Assessment Completed: Used Tobacco in last 30 days? Social Drinker.  Drinks 3 caffeinated drinks per day. Patient's occupation Scientist, research (life sciences).    REVIEW OF SYSTEMS:    GU Review Male:   Patient denies frequent urination, hard to postpone urination, burning/ pain with urination, get up at night to urinate, leakage of urine, stream starts and stops, trouble starting your stream, have to strain to urinate , erection problems, and penile pain.  Gastrointestinal (Upper):   Patient reports nausea and vomiting. Patient denies indigestion/ heartburn.  Gastrointestinal (Lower):   Patient denies diarrhea and constipation.  Constitutional:   Patient denies fever, night sweats, weight loss, and fatigue.  Skin:   Patient denies skin rash/ lesion and itching.  Eyes:   Patient denies double vision and blurred vision.  Ears/ Nose/ Throat:   Patient denies sore throat and sinus problems.  Hematologic/Lymphatic:   Patient denies swollen glands and easy bruising.  Cardiovascular:   Patient denies leg swelling and chest pains.  Respiratory:   Patient denies cough and shortness of breath.  Endocrine:   Patient denies excessive thirst.  Musculoskeletal:   Patient reports back pain. Patient denies joint pain.  Neurological:   Patient denies headaches and dizziness.  Psychologic:   Patient denies depression and anxiety.   VITAL SIGNS:  Weight 210 lb / 95.25 kg  Height 70 in / 177.8 cm  BP 123/77 mmHg  Pulse 70 /min  Temperature 97.8 F / 37 C  BMI 30.1 kg/m   MULTI-SYSTEM PHYSICAL EXAMINATION:    Constitutional: Well-nourished. No physical deformities. Normally developed. Good grooming.  Neck: Neck symmetrical, not swollen. Normal tracheal position.  Respiratory: No labored breathing, no use of accessory muscles.   Cardiovascular: Normal temperature, normal extremity pulses, no swelling, no varicosities.  Lymphatic: No  enlargement of neck, axillae, groin.  Skin: No paleness, no jaundice, no cyanosis. No lesion, no ulcer, no rash.  Neurologic / Psychiatric: Oriented to time, oriented to place, oriented to person. No depression, no anxiety, no agitation.  Gastrointestinal: No mass, no tenderness, no rigidity, non obese abdomen.  Eyes: Normal conjunctivae. Normal eyelids.  Ears, Nose, Mouth, and Throat: Left ear no scars, no lesions, no masses. Right ear no scars, no lesions, no masses. Nose no scars, no lesions, no masses. Normal hearing. Normal lips.  Musculoskeletal: Normal gait and station of head and neck.     PAST DATA REVIEWED:  Source Of History:  Patient  Lab Test Review:   BUN/Creatinine, Calcium  Records Review:   Previous Hospital Records, POC Tool  X-Ray Review: C.T. Stone Protocol: Reviewed Films. Reviewed Report. Discussed With Patient. In addition to his left renal pelvic stone he was noted to have a 3 mm left renal calculus in the lower pole.   Notes:                     On 07/11/16 he was found to have a normal creatinine of 1.04 with a normal serum calcium of 9.0   PROCEDURES:          Urinalysis w/Scope Dipstick Dipstick Cont'd Micro  Color: Yellow Bilirubin: Neg WBC/hpf: 0 - 5/hpf  Appearance: Clear Ketones: Neg RBC/hpf: 3 - 10/hpf  Specific Gravity: 1.020 Blood: 2+ Bacteria: NS (Not Seen)  pH: 5.5 Protein: Neg Cystals: NS (Not Seen)  Glucose: Neg Urobilinogen: 0.2 Casts: NS (Not Seen)    Nitrites: Neg Trichomonas: Not Present    Leukocyte Esterase: Neg Mucous: Not Present      Epithelial Cells: 0 - 5/hpf      Yeast: NS (Not Seen)      Sperm: Not Present    ASSESSMENT:      ICD-10 Details  1 GU:   Kidney Stone - N20.0 Left, He has a 12 x 8 mm stone in the left renal pelvis without evidence of obstruction on his CT scan. After discussing each of the treatment options he has elected to proceed with lithotripsy and also has elected to undergo a stent placement due to the fairly  large size of his stone. He therefore will undergo a left double-J stent placement and then lithotripsy.  2   Renal Cysts, Simple - N28.1 Left, He was noted to have left renal cysts on his CT scan done on 07/11/16. These were confirmed to be simple cyst by his previous renal ultrasound.  3   Calculus Ureter - N20.1 Right, Acute - A 2.5 mm stone was identified in his distal right ureter on his CT scan as well. It was not causing obstruction. He has passed that stone.          Notes:   We discussed the management of urinary stones. These options include observation, ureteroscopy, shockwave lithotripsy, and PCNL. We discussed which options are relevant to these particular stones. We  discussed the natural history of stones as well as the complications of untreated stones and the impact on quality of life without treatment as well as with each of the above listed treatments. We also discussed the efficacy of each treatment in its ability to clear the stone burden. With any of these management options I discussed the signs and symptoms of infection and the need for emergent treatment should these be experienced. For each option we discussed the ability of each procedure to clear the patient of their stone burden.   For observation I described the risks which include but are not limited to silent renal damage, life-threatening infection, need for emergent surgery, failure to pass stone, and pain.   For ureteroscopy I described the risks which include heart attack, stroke, pulmonary embolus, death, bleeding, infection, damage to contiguous structures, positioning injury, ureteral stricture, ureteral avulsion, ureteral injury, need for ureteral stent, inability to perform ureteroscopy, need for an interval procedure, inability to clear stone burden, stent discomfort and pain.   For shockwave lithotripsy I described the risks which include arrhythmia, kidney contusion, kidney hemorrhage, need for transfusion,  long-term risk of diabetes or hypertension, back discomfort, flank ecchymosis, flank abrasion, inability to break up stone, inability to pass stone fragments, Steinstrasse, infection associated with obstructing stones, need for different surgical procedure and possible need for repeat shockwave lithotripsy.      PLAN: 1.  Cystoscopy with left double-J stent placement. 2.  Left ESWL

## 2017-02-01 ENCOUNTER — Other Ambulatory Visit: Payer: Self-pay | Admitting: Urology

## 2017-02-05 NOTE — Patient Instructions (Addendum)
Cameron Valenzuela  02/05/2017   Your procedure is scheduled on: 02/18/17  Report to [facility] Main  Entrance Take [place]  elevators to 3rd floor to  [place] at      [place]    AM.    Call this number if you have problems the morning of surgery [phone]    Remember: ONLY 1 PERSON MAY GO WITH YOU TO SHORT STAY TO GET  READY MORNING OF [place].  Do not eat food or drink liquids :After Midnight.     Take these medicines the morning of surgery with A SIP OF WATER: NONE                                You may not have any metal on your body including hair pins and              piercings  Do not wear jewelry, make-up, lotions, powders or perfumes, deodorant                       Men may shave face and neck.   Do not bring valuables to the hospital. [place].  Contacts, dentures or bridgework may not be worn into surgery.      Patients discharged the day of surgery will not be allowed to drive home.  Name and phone number of your driver:  Special Instructions: N/A              Please read over the following fact sheets you were given: _____________________________________________________________________             [facility] - Preparing for Surgery Before surgery, you can play an important role.  Because skin is not sterile, your skin needs to be as free of germs as possible.  You can reduce the number of germs on your skin by washing with CHG (chlorahexidine gluconate) soap before surgery.  CHG is an antiseptic cleaner which kills germs and bonds with the skin to continue killing germs even after washing. Please DO NOT use if you have an allergy to CHG or antibacterial soaps.  If your skin becomes reddened/irritated stop using the CHG and inform your nurse when you arrive at Short Stay. Do not shave (including legs and underarms) for at least 48 hours prior to the first CHG shower.  You may shave  your face/neck. Please follow these instructions carefully:  1.  Shower with CHG Soap the night before surgery and the  morning of Surgery.  2.  If you choose to wash your hair, wash your hair first as usual with your  normal  shampoo.  3.  After you shampoo, rinse your hair and body thoroughly to remove the  shampoo.                           4.  Use CHG as you would any other liquid soap.  You can apply chg directly  to the skin and wash                       Gently with a scrungie or clean washcloth.  5.  Apply the CHG Soap to your body ONLY FROM THE NECK DOWN.   Do not use on face/ open                           Wound or open sores. Avoid contact with eyes, ears mouth and genitals (private parts).                       Wash face,  Genitals (private parts) with your normal soap.             6.  Wash thoroughly, paying special attention to the area where your surgery  will be performed.  7.  Thoroughly rinse your body with warm water from the neck down.  8.  DO NOT shower/wash with your normal soap after using and rinsing off  the CHG Soap.                9.  Pat yourself dry with a clean towel.            10.  Wear clean pajamas.            11.  Place clean sheets on your bed the night of your first shower and do not  sleep with pets. Day of Surgery : Do not apply any lotions/deodorants the morning of surgery.  Please wear clean clothes to the hospital/surgery center.  FAILURE TO FOLLOW THESE INSTRUCTIONS MAY RESULT IN THE CANCELLATION OF YOUR SURGERY PATIENT SIGNATURE_________________________________  NURSE SIGNATURE__________________________________  ________________________________________________________________________

## 2017-02-06 ENCOUNTER — Encounter (HOSPITAL_COMMUNITY)
Admission: RE | Admit: 2017-02-06 | Discharge: 2017-02-06 | Disposition: A | Discharge status: Home / Self Care | Payer: BLUE CROSS/BLUE SHIELD | Source: Ambulatory Visit | Attending: Urology | Admitting: Urology

## 2017-02-06 ENCOUNTER — Encounter (HOSPITAL_COMMUNITY): Payer: Self-pay

## 2017-02-06 DIAGNOSIS — Z01812 Encounter for preprocedural laboratory examination: Secondary | ICD-10-CM | POA: Insufficient documentation

## 2017-02-06 LAB — BASIC METABOLIC PANEL
Anion gap: 6 (ref 5–15)
BUN: 20 mg/dL (ref 6–20)
CO2: 26 mmol/L (ref 22–32)
Calcium: 9 mg/dL (ref 8.9–10.3)
Chloride: 107 mmol/L (ref 101–111)
Creatinine, Ser: 1.01 mg/dL (ref 0.61–1.24)
GFR calc Af Amer: >60 mL/min (ref >60)
GFR calc non Af Amer: >60 mL/min (ref >60)
Glucose, Bld: 118 mg/dL — ABNORMAL HIGH (ref 65–99)
Potassium: 4.5 mmol/L (ref 3.5–5.1)
Sodium: 139 mmol/L (ref 135–145)

## 2017-02-06 LAB — CBC
HCT: 43.4 % (ref 39.0–52.0)
Hemoglobin: 14.6 g/dL (ref 13.0–17.0)
MCH: 31 pg (ref 26.0–34.0)
MCHC: 33.6 g/dL (ref 30.0–36.0)
MCV: 92.1 fL (ref 78.0–100.0)
Platelets: 162 10*3/uL (ref 150–400)
RBC: 4.71 MIL/uL (ref 4.22–5.81)
RDW: 14.2 % (ref 11.5–15.5)
WBC: 4.7 10*3/uL (ref 4.0–10.5)

## 2017-02-17 ENCOUNTER — Encounter (HOSPITAL_COMMUNITY): Payer: Self-pay | Admitting: Anesthesiology

## 2017-02-17 NOTE — Anesthesia Preprocedure Evaluation (Addendum)
Anesthesia Evaluation  Patient identified by MRN, date of birth, ID band Patient awake    Reviewed: Allergy & Precautions, NPO status , Patient's Chart, lab work & pertinent test results  History of Anesthesia Complications (+) Family history of anesthesia reaction  Airway Mallampati: III  TM Distance: >3 FB Neck ROM: Full    Dental no notable dental hx. (+) Teeth Intact   Pulmonary neg pulmonary ROS,    Pulmonary exam normal breath sounds clear to auscultation       Cardiovascular negative cardio ROS Normal cardiovascular exam Rhythm:Regular Rate:Normal     Neuro/Psych negative neurological ROS  negative psych ROS   GI/Hepatic negative GI ROS, Neg liver ROS,   Endo/Other  negative endocrine ROS  Renal/GU Left ureteral calculus  negative genitourinary   Musculoskeletal Hx/o Rosacea   Abdominal (+) + obese,   Peds  Hematology negative hematology ROS (+)   Anesthesia Other Findings   Reproductive/Obstetrics                                                             Anesthesia Evaluation    History of Anesthesia Complications (+) Family history of anesthesia reaction  Airway        Dental   Pulmonary neg pulmonary ROS,           Cardiovascular negative cardio ROS       Neuro/Psych negative neurological ROS  negative psych ROS   GI/Hepatic negative GI ROS, Neg liver ROS,   Endo/Other  negative endocrine ROS  Renal/GU Left ureteral calculus  negative genitourinary   Musculoskeletal Hx/o Rosacea   Abdominal   Peds  Hematology negative hematology ROS (+)   Anesthesia Other Findings   Reproductive/Obstetrics                             Anesthesia Physical Anesthesia Plan  ASA: II  Anesthesia Plan: General   Post-op Pain Management:    Induction: Intravenous  PONV Risk Score and Plan: 4 or greater and Ondansetron,  Dexamethasone, Midazolam, Scopolamine patch - Pre-op and Propofol infusion  Airway Management Planned: LMA  Additional Equipment:   Intra-op Plan:   Post-operative Plan: Extubation in OR  Informed Consent:   Plan Discussed with: CRNA, Anesthesiologist and Surgeon  Anesthesia Plan Comments:         Anesthesia Quick Evaluation  Anesthesia Physical Anesthesia Plan  ASA: II  Anesthesia Plan: General   Post-op Pain Management:    Induction: Intravenous  PONV Risk Score and Plan: 4 or greater and Ondansetron, Dexamethasone, Midazolam, Scopolamine patch - Pre-op and Propofol infusion  Airway Management Planned: LMA  Additional Equipment:   Intra-op Plan:   Post-operative Plan: Extubation in OR  Informed Consent: I have reviewed the patients History and Physical, chart, labs and discussed the procedure including the risks, benefits and alternatives for the proposed anesthesia with the patient or authorized representative who has indicated his/her understanding and acceptance.   Dental advisory given  Plan Discussed with: CRNA, Anesthesiologist and Surgeon  Anesthesia Plan Comments:        Anesthesia Quick Evaluation

## 2017-02-17 NOTE — H&P (Signed)
HPI: Cameron Valenzuela is a 46 year-old male with a left renal calculus who presents today for stent placement followed by lithotripsy.  The problem is on the left side. His symptoms include flank pain. Patient denies having back pain, groin pain, nausea, vomiting, fever, chills, and blood in urine. He first began experiencing symptoms 07/11/2016. This is not his first kidney stone.   He has never had surgical treatment for calculi in the past.   He was seen in the emergency room on 07/11/16 with intermittent pain in the left lower quadrant. The pain was severe when it occurred and constant. A CT scan was obtained which revealed a 12 x 8 mm stone in the renal pelvis with Hounsfield units of 1000 as well as a smaller stone in the lower pole of the left kidney that was nonobstructing. In addition he had a 2.5 mm stone seen in the distal right ureter on the right hand side with no right renal calculi.   He said that he had intermittently had some pain in the back for about 6 months although it had been mild. Recently though he developed increased pressure-type sensation with nausea that has also been intermittent. It did become severe and he went to the ER where he was diagnosed with his left renal pelvic stone as well as his right ureteral stone. He has no prior history of stones. He has seen some gross hematuria intermittently. He has no change in his voiding pattern. There is no family history of kidney stones. He is not having any discomfort at this time. He did undergo a renal ultrasound several months ago but apparently no stone was identified area he was found to have a renal cyst.  He passed his right distal ureteral stone last night and he caught this in his strainer.   Interval history 01/31/17: When I saw him in 12/17 he elected to undergo lithotripsy of left renal pelvic stone but failed to undergo the procedure. There were some issues with his work in [employer] under[employer] and he now has returned and would  like to proceed. He said since I seen him last he has passed 2 small stones and a broad one and that was about 1 mm or less in size. He is asymptomatic currently.     ALLERGIES: None   MEDICATIONS: Gas-X  Ibuprofen 800 mg tablet     GU PSH: None   NON-GU PSH: None   GU PMH: Renal calculus, Left, He has a 12 x 8 mm stone in the left renal pelvis without evidence of obstruction on his CT scan. After discussing each of the treatment options he has elected to proceed with lithotripsy and also has elected to undergo a stent placement due to the fairly large size of his stone. He therefore will undergo a left double-J stent placement and then lithotripsy. - 08-11-16 Renal cyst, Left, He was noted to have left renal cysts on his CT scan done on 07/11/16. These were confirmed to be simple cyst by his previous renal ultrasound. - Aug 11, 2016 Ureteral calculus (Acute), Right, A 2.5 mm stone was identified in his distal right ureter on his CT scan as well. It was not causing obstruction. He has passed that stone. - August 11, 2016    NON-GU PMH: None   FAMILY HISTORY: 1 Daughter - Other cardiac disorder - Runs in Family Diabetes - Runs in Family   SOCIAL HISTORY: Marital Status: Married Current Smoking Status: Patient has never smoked.   Tobacco Use Assessment Completed:  Used Tobacco in last 30 days? Social Drinker.  Drinks 3 caffeinated drinks per day. Patient's occupation Scientist, research (life sciences).    REVIEW OF SYSTEMS:    GU Review Male:   Patient denies frequent urination, hard to postpone urination, burning/ pain with urination, get up at night to urinate, leakage of urine, stream starts and stops, trouble starting your stream, have to strain to urinate , erection problems, and penile pain.  Gastrointestinal (Upper):   Patient denies nausea, vomiting, and indigestion/ heartburn.  Gastrointestinal (Lower):   Patient denies diarrhea and constipation.  Constitutional:   Patient denies fever,  night sweats, weight loss, and fatigue.  Skin:   Patient denies skin rash/ lesion and itching.  Eyes:   Patient denies blurred vision and double vision.  Ears/ Nose/ Throat:   Patient denies sore throat and sinus problems.  Hematologic/Lymphatic:   Patient denies swollen glands and easy bruising.  Cardiovascular:   Patient denies leg swelling and chest pains.  Respiratory:   Patient denies cough and shortness of breath.  Endocrine:   Patient denies excessive thirst.  Musculoskeletal:   Patient denies back pain and joint pain.  Neurological:   Patient denies headaches and dizziness.  Psychologic:   Patient denies depression and anxiety.   VITAL SIGNS:    Weight 230 lb / 104.33 kg  Height 70 in / 177.8 cm  BP 128/78 mmHg  Pulse 85 /min  BMI 33.0 kg/m   MULTI-SYSTEM PHYSICAL EXAMINATION:    Constitutional: Well-nourished. No physical deformities. Normally developed. Good grooming.  Neck: Neck symmetrical, not swollen. Normal tracheal position.  Respiratory: No labored breathing, no use of accessory muscles.   Cardiovascular: Normal temperature, normal extremity pulses, no swelling, no varicosities.  Lymphatic: No enlargement of neck, axillae, groin.  Skin: No paleness, no jaundice, no cyanosis. No lesion, no ulcer, no rash.  Neurologic / Psychiatric: Oriented to time, oriented to place, oriented to person. No depression, no anxiety, no agitation.  Gastrointestinal: No mass, no tenderness, no rigidity, non obese abdomen.  Eyes: Normal conjunctivae. Normal eyelids.  Ears, Nose, Mouth, and Throat: Left ear no scars, no lesions, no masses. Right ear no scars, no lesions, no masses. Nose no scars, no lesions, no masses. Normal hearing. Normal lips.  Musculoskeletal: Normal gait and station of head and neck.    PAST DATA REVIEWED:  Source Of History:  Patient  Records Review:   Previous Patient Records, POC Tool  X-Ray Review: [facility] Protocol: Reviewed Films. Previous CT scan images  were reviewed and compared with today's study.    PROCEDURES:         KUB - [id number]  A single view of the abdomen is obtained.      He continues to have a single stone located in the lower pole of his left kidney. It measures 11 x 7 which is essentially unchanged from 12/17.         Urinalysis Dipstick Dipstick Cont'd  Color: Yellow Bilirubin: Neg  Appearance: Clear Ketones: Neg  Specific Gravity: 1.025 Blood: Neg  pH: <=5.0 Protein: Neg  Glucose: Neg Urobilinogen: 0.2    Nitrites: Neg    Leukocyte Esterase: Neg    ASSESSMENT/PLAN:        ICD-10 Details  1 GU:   Renal calculus - N20.0 Left, Stable - He has a left renal calculus which has remained stable but he would like to proceed with treatment. He recalled the discussion we had previously (We discussed the management of urinary stones.  These options include observation, ureteroscopy, shockwave lithotripsy, and PCNL. We discussed which options are relevant to these particular stones. We discussed the natural history of stones as well as the complications of untreated stones and the impact on quality of life without treatment as well as with each of the above listed treatments. We also discussed the efficacy of each treatment in its ability to clear the stone burden. With any of these management options I discussed the signs and symptoms of infection and the need for emergent treatment should these be experienced. For each option we discussed the ability of each procedure to clear the patient of their stone burden.   For observation I described the risks which include but are not limited to silent renal damage, life-threatening infection, need for emergent surgery, failure to pass stone, and pain.   For ureteroscopy I described the risks which include heart attack, stroke, pulmonary embolus, death, bleeding, infection, damage to contiguous structures, positioning injury, ureteral stricture, ureteral avulsion, ureteral injury, need for  ureteral stent, inability to perform ureteroscopy, need for an interval procedure, inability to clear stone burden, stent discomfort and pain.   For shockwave lithotripsy I described the risks which include arrhythmia, kidney contusion, kidney hemorrhage, need for transfusion, long-term risk of diabetes or hypertension, back discomfort, flank ecchymosis, flank abrasion, inability to break up stone, inability to pass stone fragments, Steinstrasse, infection associated with obstructing stones, need for different surgical procedure and possible need for repeat shockwave lithotripsy.) but I again reiterated the options for treatment. He would like to proceed with lithotripsy and understands the need for stent and possible second course of shockwave therapy.

## 2017-02-18 ENCOUNTER — Ambulatory Visit (HOSPITAL_COMMUNITY): Payer: BLUE CROSS/BLUE SHIELD | Admitting: Certified Registered Nurse Anesthetist

## 2017-02-18 ENCOUNTER — Ambulatory Visit (HOSPITAL_COMMUNITY): Payer: BLUE CROSS/BLUE SHIELD

## 2017-02-18 ENCOUNTER — Encounter (HOSPITAL_COMMUNITY)
Admission: RE | Disposition: A | Discharge status: Home / Self Care | Payer: Self-pay | Source: Ambulatory Visit | Attending: Urology

## 2017-02-18 ENCOUNTER — Ambulatory Visit (HOSPITAL_COMMUNITY)
Admission: RE | Admit: 2017-02-18 | Discharge: 2017-02-18 | Disposition: A | Discharge status: Home / Self Care | Payer: BLUE CROSS/BLUE SHIELD | Source: Ambulatory Visit | Attending: Urology | Admitting: Urology

## 2017-02-18 ENCOUNTER — Encounter (HOSPITAL_COMMUNITY): Payer: Self-pay | Admitting: *Deleted

## 2017-02-18 DIAGNOSIS — N200 Calculus of kidney: Secondary | ICD-10-CM | POA: Diagnosis not present

## 2017-02-18 HISTORY — PX: EXTRACORPOREAL SHOCK WAVE LITHOTRIPSY: SHX1557

## 2017-02-18 HISTORY — PX: CYSTOSCOPY WITH STENT PLACEMENT: SHX5790

## 2017-02-18 SURGERY — LITHOTRIPSY, ESWL
Anesthesia: LOCAL | Laterality: Left

## 2017-02-18 SURGERY — CYSTOSCOPY, WITH STENT INSERTION
Anesthesia: General | Laterality: Left

## 2017-02-18 MED ORDER — PROPOFOL 10 MG/ML IV BOLUS
INTRAVENOUS | Status: DC | PRN
  Administered 2017-02-18: 200 mg via INTRAVENOUS

## 2017-02-18 MED ORDER — MEPERIDINE HCL 50 MG/ML IJ SOLN: 6.2500 mg | INTRAMUSCULAR | Status: DC | PRN

## 2017-02-18 MED ORDER — LIDOCAINE HCL (CARDIAC) 20 MG/ML IV SOLN
INTRAVENOUS | Status: DC | PRN
  Administered 2017-02-18: 50 mg via INTRAVENOUS

## 2017-02-18 MED ORDER — FENTANYL CITRATE (PF) 100 MCG/2ML IJ SOLN
INTRAMUSCULAR | Status: DC | PRN
  Administered 2017-02-18: 25 ug via INTRAVENOUS
  Administered 2017-02-18: 50 ug via INTRAVENOUS
  Administered 2017-02-18: 25 ug via INTRAVENOUS

## 2017-02-18 MED ORDER — DEXAMETHASONE SODIUM PHOSPHATE 4 MG/ML IJ SOLN
INTRAMUSCULAR | Status: DC | PRN
  Administered 2017-02-18: 10 mg via INTRAVENOUS

## 2017-02-18 MED ORDER — SCOPOLAMINE 1 MG/3DAYS TD PT72
MEDICATED_PATCH | TRANSDERMAL | Status: AC
  Filled 2017-02-18: qty 1

## 2017-02-18 MED ORDER — PROPOFOL 10 MG/ML IV BOLUS
INTRAVENOUS | Status: AC
  Filled 2017-02-18: qty 20

## 2017-02-18 MED ORDER — CIPROFLOXACIN IN D5W 400 MG/200ML IV SOLN
INTRAVENOUS | Status: AC
  Filled 2017-02-18: qty 200

## 2017-02-18 MED ORDER — FENTANYL CITRATE (PF) 100 MCG/2ML IJ SOLN
INTRAMUSCULAR | Status: AC
  Filled 2017-02-18: qty 2

## 2017-02-18 MED ORDER — FLUMAZENIL 0.5 MG/5ML IV SOLN
INTRAVENOUS | Status: AC
  Filled 2017-02-18: qty 5

## 2017-02-18 MED ORDER — PROMETHAZINE HCL 25 MG/ML IJ SOLN: 6.2500 mg | INTRAMUSCULAR | Status: DC | PRN

## 2017-02-18 MED ORDER — SODIUM CHLORIDE 0.9 % IR SOLN
Status: DC | PRN
  Administered 2017-02-18: 3000 mL via INTRAVESICAL

## 2017-02-18 MED ORDER — SODIUM CHLORIDE 0.9 % IV SOLN
INTRAVENOUS | Status: DC
  Administered 2017-02-18: 09:00:00 via INTRAVENOUS

## 2017-02-18 MED ORDER — MIDAZOLAM HCL 2 MG/2ML IJ SOLN
INTRAMUSCULAR | Status: AC
  Filled 2017-02-18: qty 2

## 2017-02-18 MED ORDER — LIDOCAINE 2% (20 MG/ML) 5 ML SYRINGE
INTRAMUSCULAR | Status: AC
  Filled 2017-02-18: qty 5

## 2017-02-18 MED ORDER — SUCCINYLCHOLINE CHLORIDE 200 MG/10ML IV SOSY
PREFILLED_SYRINGE | INTRAVENOUS | Status: AC
  Filled 2017-02-18: qty 10

## 2017-02-18 MED ORDER — LACTATED RINGERS IV SOLN
INTRAVENOUS | Status: DC | PRN
  Administered 2017-02-18: 07:00:00 via INTRAVENOUS

## 2017-02-18 MED ORDER — PROPOFOL 500 MG/50ML IV EMUL
INTRAVENOUS | Status: DC | PRN
  Administered 2017-02-18: 25 ug/kg/min via INTRAVENOUS

## 2017-02-18 MED ORDER — ONDANSETRON HCL 4 MG/2ML IJ SOLN
INTRAMUSCULAR | Status: AC
  Filled 2017-02-18: qty 2

## 2017-02-18 MED ORDER — MIDAZOLAM HCL 2 MG/2ML IJ SOLN
INTRAMUSCULAR | Status: DC | PRN
  Administered 2017-02-18: 2 mg via INTRAVENOUS

## 2017-02-18 MED ORDER — CIPROFLOXACIN IN D5W 400 MG/200ML IV SOLN
INTRAVENOUS | Status: DC | PRN
  Administered 2017-02-18: 200 mg via INTRAVENOUS

## 2017-02-18 MED ORDER — SCOPOLAMINE 1 MG/3DAYS TD PT72
MEDICATED_PATCH | TRANSDERMAL | Status: DC | PRN
  Administered 2017-02-18: 1 via TRANSDERMAL

## 2017-02-18 MED ORDER — IOHEXOL 300 MG/ML  SOLN
INTRAMUSCULAR | Status: DC | PRN
  Administered 2017-02-18: 10 mL via URETHRAL

## 2017-02-18 MED ORDER — PROPOFOL 10 MG/ML IV BOLUS
INTRAVENOUS | Status: AC
  Filled 2017-02-18: qty 40

## 2017-02-18 MED ORDER — DEXAMETHASONE SODIUM PHOSPHATE 10 MG/ML IJ SOLN
INTRAMUSCULAR | Status: AC
  Filled 2017-02-18: qty 1

## 2017-02-18 MED ORDER — ONDANSETRON HCL 4 MG/2ML IJ SOLN
INTRAMUSCULAR | Status: DC | PRN
  Administered 2017-02-18: 4 mg via INTRAVENOUS

## 2017-02-18 SURGICAL SUPPLY — 12 items
BAG URO CATCHER STRL LF (MISCELLANEOUS) ×2 IMPLANT
CATH INTERMIT  6FR 70CM (CATHETERS) ×2 IMPLANT
CLOTH BEACON ORANGE TIMEOUT ST (SAFETY) ×2 IMPLANT
COVER SURGICAL LIGHT HANDLE (MISCELLANEOUS) IMPLANT
GLOVE BIOGEL M 8.0 STRL (GLOVE) ×2 IMPLANT
GOWN STRL REUS W/ TWL XL LVL3 (GOWN DISPOSABLE) ×1 IMPLANT
GOWN STRL REUS W/TWL XL LVL3 (GOWN DISPOSABLE) ×1
GUIDEWIRE STR DUAL SENSOR (WIRE) ×2 IMPLANT
MANIFOLD NEPTUNE II (INSTRUMENTS) ×2 IMPLANT
PACK CYSTO (CUSTOM PROCEDURE TRAY) ×2 IMPLANT
STENT POLARIS 5FRX26 (STENTS) ×2 IMPLANT
TUBING CONNECTING 10 (TUBING) ×2 IMPLANT

## 2017-02-18 SURGICAL SUPPLY — 2 items
COVER SURGICAL LIGHT HANDLE (MISCELLANEOUS) ×2 IMPLANT
TOWEL OR 17X26 10 PK STRL BLUE (TOWEL DISPOSABLE) ×2 IMPLANT

## 2017-02-18 NOTE — Anesthesia Procedure Notes (Signed)
Procedure Name: LMA Insertion Date/Time: 02/18/2017 7:36 AM Performed by: Claudia Desanctis Pre-anesthesia Checklist: Emergency Drugs available, Patient identified, Suction available and Patient being monitored Patient Re-evaluated:Patient Re-evaluated prior to induction Oxygen Delivery Method: Circle system utilized Preoxygenation: Pre-oxygenation with 100% oxygen Induction Type: IV induction Ventilation: Mask ventilation without difficulty LMA: LMA inserted LMA Size: 5.0 Number of attempts: 1 Placement Confirmation: positive ETCO2 and breath sounds checked- equal and bilateral Tube secured with: Tape Dental Injury: Teeth and Oropharynx as per pre-operative assessment

## 2017-02-18 NOTE — Transfer of Care (Signed)
Immediate Anesthesia Transfer of Care Note  Patient: Cameron Valenzuela  Procedure(s) Performed: Procedure(s): CYSTOSCOPY/ LEFT RETROGRADE/ WITH LEFT STENT PLACEMENT (Left)  Patient Location: PACU  Anesthesia Type:General  Level of Consciousness:  sedated, patient cooperative and responds to stimulation  Airway & Oxygen Therapy:Patient Spontanous Breathing and Patient connected to face mask oxgen  Post-op Assessment:  Report given to PACU RN and Post -op Vital signs reviewed and stable  Post vital signs:  Reviewed and stable  Last Vitals:  Vitals:   02/18/17 0539  BP: 129/74  Pulse: 72  Resp: 18  Temp: 97.9 C    Complications: No apparent anesthesia complications

## 2017-02-18 NOTE — Op Note (Signed)
See [facility] OP note scanned into chart. 

## 2017-02-18 NOTE — Anesthesia Postprocedure Evaluation (Signed)
Anesthesia Post Note  Patient: Teon Hudnall Weinmann  Procedure(s) Performed: Procedure(s) (LRB): CYSTOSCOPY/ LEFT RETROGRADE/ WITH LEFT STENT PLACEMENT (Left)     Patient location during evaluation: PACU Anesthesia Type: General Level of consciousness: awake and alert and oriented Pain management: pain level controlled Vital Signs Assessment: post-procedure vital signs reviewed and stable Respiratory status: spontaneous breathing, nonlabored ventilation and respiratory function stable Cardiovascular status: blood pressure returned to baseline and stable Postop Assessment: no signs of nausea or vomiting Anesthetic complications: no    Last Vitals:  Vitals:   02/18/17 0830 02/18/17 0840  BP: 115/74 122/69  Pulse: 66 66  Resp: 13 14  Temp: 36.7 C 36.5 C    Last Pain:  Vitals:   02/18/17 0830  TempSrc:   PainSc: 0-No pain                 Fradel Baldonado A.

## 2017-02-18 NOTE — Op Note (Signed)
PATIENT:  Cameron Valenzuela  Preoperative diagnosis:  1. Left renal pelvic stone   Postoperative diagnosis:  1. Same  Procedure:  1. Cystoscopy       2. left retrograde pyelogram with interpretation       3.  Left ureteral stent placement  Surgeon: Elta Guadeloupe C. Keyondre Hepburn  Drains: 5 French, 26 cm Polaris stent in the left ureter (no string)  Specimens: None  Indication: Cameron Valenzuela is a 47 year old male who has a left renal pelvic stone. He has elected to proceed with lithotripsy but due to the size of the stone I have recommended placement of a stent in order to prevent obstruction from a Steinstrasse.  We have discussed the potential benefits and risks of the procedure, side effects of the proposed treatment, the likelihood of the patient achieving the goals of the procedure, and any potential problems that might occur during the procedure or recuperation. Informed consent has been obtained.  Description of procedure:  The patient was taken to the operating room and general anesthesia was induced.  The patient was placed in the dorsal lithotomy position, prepped and draped in the usual sterile fashion, and preoperative antibiotics were administered. A preoperative time-out was performed.   Cystourethroscopy was performed.  The patient's urethra was examined and the prostate was noted to be mildly enlarged but not obstructing.  The bladder was then systematically examined in its entirety. There was no evidence for any bladder tumors, stones, or other mucosal pathology.    Attention then turned to the left ureteral orifice and a ureteral catheter was used to intubate the ureteral orifice.  Omnipaque contrast was injected through the ureteral catheter and a retrograde pyelogram was performed which revealed a normal ureter throughout its length. There was some J hooking at the level of the bladder.   the stone was located in the renal pelvis without obstruction.   A 0.038 sensor guidewire was  then advanced up the left ureter into the upper pole.  The wire was then backloaded through the cystoscope and a ureteral stent was advance over the wire using Seldinger technique.  The stent was positioned appropriately under fluoroscopic and cystoscopic guidance.  The wire was then removed with an adequate stent curl noted upper pole.  The bladder was then emptied and the procedure ended.  The patient appeared to tolerate the procedure well and without complications.  The patient was able to be awakened and transferred to the recovery unit in satisfactory condition.

## 2017-02-18 NOTE — Discharge Instructions (Signed)
General Anesthesia, Adult, Care After These instructions provide you with information about caring for yourself after your procedure. Your health care provider may also give you more specific instructions. Your treatment has been planned according to current medical practices, but problems sometimes occur. Call your health care provider if you have any problems or questions after your procedure. What can I expect after the procedure? After the procedure, it is common to have:  Vomiting.  A sore throat.  Mental slowness.  It is common to feel:  Nauseous.  Cold or shivery.  Sleepy.  Tired.  Sore or achy, even in parts of your body where you did not have surgery.  Follow these instructions at home: For at least 24 hours after the procedure:  Do not: ? Participate in activities where you could fall or become injured. ? Drive. ? Use heavy machinery. ? Drink alcohol. ? Take sleeping pills or medicines that cause drowsiness. ? Make important decisions or sign legal documents. ? Take care of children on your own.  Rest. Eating and drinking  If you vomit, drink water, juice, or soup when you can drink without vomiting.  Drink enough fluid to keep your urine clear or pale yellow.  Make sure you have little or no nausea before eating solid foods.  Follow the diet recommended by your health care provider. General instructions  Have a responsible adult stay with you until you are awake and alert.  Return to your normal activities as told by your health care provider. Ask your health care provider what activities are safe for you.  Take over-the-counter and prescription medicines only as told by your health care provider.  If you smoke, do not smoke without supervision.  Keep all follow-up visits as told by your health care provider. This is important. Contact a health care provider if:  You continue to have nausea or vomiting at home, and medicines are not helpful.  You  cannot drink fluids or start eating again.  You cannot urinate after 8-12 hours.  You develop a skin rash.  You have fever.  You have increasing redness at the site of your procedure. Get help right away if:  You have difficulty breathing.  You have chest pain.  You have unexpected bleeding.  You feel that you are having a life-threatening or urgent problem. This information is not intended to replace advice given to you by your health care provider. Make sure you discuss any questions you have with your health care provider. Document Released: 10/08/2000 Document Revised: 12/05/2015 Document Reviewed: 06/16/2015 Elsevier Interactive Patient Education  2018 [place] stent placement instructions   Definitions:  Ureter: The duct that transports urine from the kidney to the bladder. Stent: A plastic hollow tube that is placed into the ureter, from the kidney to the bladder to prevent the ureter from swelling shut.  General instructions:  Despite the fact that no skin incisions were used, the area around the ureter and bladder is raw and irritated. The stent is a foreign body which can further irritate the bladder wall. This irritation is manifested by increased frequency of urination, both day and night, and by an increase in the urge to urinate. In some, the urge to urinate is present almost always. Sometimes the urge is strong enough that you may not be able to stop your self from urinating. This can often be controlled with medication but does not occur in everyone. A stent can safely be left in place for 3  months or greater.  You may see some blood in your urine while the stent is in place and a few days afterward. Do not be alarmed, even if the urine is clear for a while. Get off your feet and drink lots of fluids until clearing occurs. If you start to pass clots or don't improve, call us.  Diet:  You may return to your normal diet immediately. Because of the raw  surface of your bladder, alcohol, spicy foods, foods high in acid and drinks with caffeine may cause irritation or frequency and should be used in moderation. To keep your urine flowing freely and avoid constipation, drink plenty of fluids during the day (8-10 glasses). Tip: Avoid cranberry juice because it is very acidic.  Activity:  Your physical activity doesn't need to be restricted. However, if you are very active, you may see some blood in the urine. We suggest that you reduce your activity under the circumstances until the bleeding has stopped.  Bowels:  It is important to keep your bowels regular during the postoperative period. Straining with bowel movements can cause bleeding. A bowel movement every other day is reasonable. Use a mild laxative if needed, such as milk of magnesia 2-3 tablespoons, or 2 Dulcolax tablets. Call if you continue to have problems. If you had been taking narcotics for pain, before, during or after your surgery, you may be constipated. Take a laxative if necessary.  Medication:  You should resume your pre-surgery medications unless told not to. In addition you may be given an antibiotic to prevent or treat infection. Antibiotics are not always necessary. All medication should be taken as prescribed until the bottles are finished unless you are having an unusual reaction to one of the drugs.  Problems you should report to [place]:  a. Fever greater than 101F. b. Heavy bleeding, or clots (see notes above about blood in urine). c. Inability to urinate. d. Drug reactions (hives, rash, nausea, vomiting, diarrhea). e. Severe burning or pain with urination that is not improving.   Lithotripsy, Care After This sheet gives you information about how to care for yourself after your procedure. Your health care provider may also give you more specific instructions. If you have problems or questions, contact your health care provider. What can I expect after the  procedure? After the procedure, it is common to have:  Some blood in your urine. This should only last for a few days.  Soreness in your back, sides, or upper abdomen for a few days.  Blotches or bruises on your back where the pressure wave entered the skin.  Pain, discomfort, or nausea when pieces (fragments) of the kidney stone move through the tube that carries urine from the kidney to the bladder (ureter). Stone fragments may pass soon after the procedure, but they may continue to pass for up to 4-8 weeks. ? If you have severe pain or nausea, contact your health care provider. This may be caused by a large stone that was not broken up, and this may mean that you need more treatment.  Some pain or discomfort during urination.  Some pain or discomfort in the lower abdomen or (in men) at the base of the penis.  Follow these instructions at home: Medicines  Take over-the-counter and prescription medicines only as told by your health care provider.  If you were prescribed an antibiotic medicine, take it as told by your health care provider. Do not stop taking the antibiotic even if you start to  feel better.  Do not drive for 24 hours if you were given a medicine to help you relax (sedative).  Do not drive or use heavy machinery while taking prescription pain medicine. Eating and drinking  Drink enough water and fluids to keep your urine clear or pale yellow. This helps any remaining pieces of the stone to pass. It can also help prevent new stones from forming.  Eat plenty of fresh fruits and vegetables.  Follow instructions from your health care provider about eating and drinking restrictions. You may be instructed: ? To reduce how much salt (sodium) you eat or drink. Check ingredients and nutrition facts on packaged foods and beverages. ? To reduce how much meat you eat.  Eat the recommended amount of calcium for your age and gender. Ask your health care provider how much calcium  you should have. General instructions  Get plenty of rest.  Most people can resume normal activities 1-2 days after the procedure. Ask your health care provider what activities are safe for you.  If directed, strain all urine through the strainer that was provided by your health care provider. ? Keep all fragments for your health care provider to see. Any stones that are found may be sent to a medical lab for examination. The stone may be as small as a grain of salt.  Keep all follow-up visits as told by your health care provider. This is important. Contact a health care provider if:  You have pain that is severe or does not get better with medicine.  You have nausea that is severe or does not go away.  You have blood in your urine longer than your health care provider told you to expect.  You have more blood in your urine.  You have pain during urination that does not go away.  You urinate more frequently than usual and this does not go away.  You develop a rash or any other possible signs of an allergic reaction. Get help right away if:  You have severe pain in your back, sides, or upper abdomen.  You have severe pain while urinating.  Your urine is very dark red.  You have blood in your stool (feces).  You cannot pass any urine at all.  You feel a strong urge to urinate after emptying your bladder.  You have a fever or chills.  You develop shortness of breath, difficulty breathing, or chest pain.  You have severe nausea that leads to persistent vomiting.  You faint. Summary  After this procedure, it is common to have some pain, discomfort, or nausea when pieces (fragments) of the kidney stone move through the tube that carries urine from the kidney to the bladder (ureter). If this pain or nausea is severe, however, you should contact your health care provider.  Most people can resume normal activities 1-2 days after the procedure. Ask your health care provider  what activities are safe for you.  Drink enough water and fluids to keep your urine clear or pale yellow. This helps any remaining pieces of the stone to pass, and it can help prevent new stones from forming.  If directed, strain your urine and keep all fragments for your health care provider to see. Fragments or stones may be as small as a grain of salt.  Get help right away if you have severe pain in your back, sides, or upper abdomen or have severe pain while urinating.

## 2017-02-26 ENCOUNTER — Telehealth: Payer: Self-pay | Admitting: Internal Medicine

## 2017-03-06 NOTE — Telephone Encounter (Signed)
Completed.

## 2017-03-12 ENCOUNTER — Other Ambulatory Visit: Payer: Self-pay | Admitting: Urology

## 2017-03-13 ENCOUNTER — Encounter: Payer: Managed Care, Other (non HMO) | Admitting: Internal Medicine

## 2017-03-26 ENCOUNTER — Other Ambulatory Visit: Payer: Self-pay | Admitting: Urology

## 2017-04-01 NOTE — H&P (Signed)
Surgery cancelled

## 2017-04-03 ENCOUNTER — Other Ambulatory Visit: Payer: Self-pay | Admitting: Urology

## 2017-04-04 ENCOUNTER — Ambulatory Visit (HOSPITAL_COMMUNITY): Admission: RE | Admit: 2017-04-04 | Payer: BLUE CROSS/BLUE SHIELD | Source: Ambulatory Visit | Admitting: Urology

## 2017-04-04 ENCOUNTER — Encounter (HOSPITAL_COMMUNITY): Admission: RE | Payer: Self-pay | Source: Ambulatory Visit

## 2017-04-04 SURGERY — LITHOTRIPSY, ESWL
Anesthesia: LOCAL | Laterality: Left

## 2017-04-05 ENCOUNTER — Encounter (HOSPITAL_COMMUNITY): Payer: Self-pay | Admitting: General Practice

## 2017-04-08 ENCOUNTER — Other Ambulatory Visit: Payer: Self-pay | Admitting: Urology

## 2017-04-10 NOTE — H&P (Signed)
CC: I have kidney stones.(Surgery)  HPI: Cameron Valenzuela is a 46 year-old male established patient who is here for renal calculi after a surgical intervention.  Underwent ESWL for a large left renal stone. Because of the stone size, it was recommended he undergo pre-procedure stent to prevent obstruction from [place]. He presents today for imaging and consideration for stent removal.  03/08/17: He states passing some small sand like fragments but has not seen any significant stone fragments pass. Left-sided pain has been intermittent and well controlled with ibuprofen use. Not having significant frequency or urgency. He does endorse some pain at the end of his urine stream. Denies significant gross hematuria. Patient is afebrile and tolerating oral intake without issue.   The problem is on the left side. He had stent and eswl for treatment of his renal calculi. Patient denies ureteroscopy and percutaneous lithotomy. This procedure was done 02/18/2017. He did pass stone fragments. This is not his first kidney stone. He does have a stent in place.   He is not currently having flank pain, back pain, groin pain, nausea, vomiting, fever or chills.   He does have dysuria. He does not have urgency. He does not have frequency.     ALLERGIES: None   MEDICATIONS: Oxybutynin Chloride 5 mg tablet 1 tablet PO Q 8 H prn urinary urgency/ frequency bladder spasms  Gas-X  Ibu 800 mg tablet     GU PSH: Cystoscopy Insert Stent, Left - 02/18/2017 ESWL - 02/18/2017    NON-GU PSH: None   GU PMH: Renal calculus (Stable), Left, He has a left renal calculus which has remained stable but he would like to proceed with treatment. He recalled the discussion we had previously but I again reiterated the options for treatment. He would like to proceed with lithotripsy and understands the need for stent and possible second course of shockwave therapy. - 01/31/2017, Left, He has a 12 x 8 mm stone in the left renal pelvis  without evidence of obstruction on his CT scan. After discussing each of the treatment options he has elected to proceed with lithotripsy and also has elected to undergo a stent placement due to the fairly large size of his stone. He therefore will undergo a left double-J stent placement and then lithotripsy., - 2016-07-16 Renal cyst, Left, He was noted to have left renal cysts on his CT scan done on 07/11/16. These were confirmed to be simple cyst by his previous renal ultrasound. - 07-16-16 Ureteral calculus (Acute), Right, A 2.5 mm stone was identified in his distal right ureter on his CT scan as well. It was not causing obstruction. He has passed that stone. - Jul 16, 2016    NON-GU PMH: None   FAMILY HISTORY: 1 Daughter - Other cardiac disorder - Runs in Family Diabetes - Runs in Family   SOCIAL HISTORY: Marital Status: Married Preferred Language: English; Ethnicity: Not Hispanic Or Latino; Race: White Current Smoking Status: Patient has never smoked.   Tobacco Use Assessment Completed: Used Tobacco in last 30 days? Social Drinker.  Drinks 3 caffeinated drinks per day. Patient's occupation Scientist, research (life sciences).    REVIEW OF SYSTEMS:    GU Review Male:   Patient reports burning/ pain with urination. Patient denies frequent urination, hard to postpone urination, get up at night to urinate, leakage of urine, stream starts and stops, trouble starting your stream, have to strain to urinate , erection problems, and penile pain.  Gastrointestinal (Upper):   Patient denies nausea, vomiting, and indigestion/  heartburn.  Gastrointestinal (Lower):   Patient denies diarrhea and constipation.  Constitutional:   Patient denies fatigue, weight loss, fever, and night sweats.  Skin:   Patient denies skin rash/ lesion and itching.  Eyes:   Patient denies blurred vision and double vision.  Ears/ Nose/ Throat:   Patient denies sore throat and sinus problems.  Hematologic/Lymphatic:   Patient  denies swollen glands and easy bruising.  Cardiovascular:   Patient denies leg swelling and chest pains.  Respiratory:   Patient denies cough and shortness of breath.  Endocrine:   Patient denies excessive thirst.  Musculoskeletal:   Patient reports back pain. Patient denies joint pain.  Neurological:   Patient denies headaches and dizziness.  Psychologic:   Patient denies depression and anxiety.   Notes: Updated from previous visit 07/12/2016 with review from patient as noted above.   VITAL SIGNS:      03/08/2017 01:45 PM  BP 145/82 mmHg  Heart Rate 102 /min  Temperature 99.4 F / 37.4 C   MULTI-SYSTEM PHYSICAL EXAMINATION:    Constitutional: Well-nourished. No physical deformities. Normally developed. Good grooming.  Respiratory: No labored breathing, no use of accessory muscles. CTA.  Cardiovascular: Normal temperature, normal extremity pulses, no swelling, no varicosities. RRR.  Skin: No paleness, no jaundice, no cyanosis. No lesion, no ulcer, no rash.  Neurologic / Psychiatric: Oriented to time, oriented to place, oriented to person. No depression, no anxiety, no agitation.  Gastrointestinal: Obese abdomen. No mass, no tenderness, no rigidity. No flank or suprapubic tenderness. No CVAT.     PAST DATA REVIEWED:  Source Of History:  Patient  Records Review:   Previous Hospital Records, Previous Patient Records  Urine Test Review:   Urinalysis  X-Ray Review: [facility]: Reviewed Films.     03/08/17  Urinalysis  Urine Appearance Cloudy   Urine Color Yellow   Urine Glucose Neg   Urine Bilirubin Neg   Urine Ketones Neg   Urine Specific Gravity 1.020   Urine Blood 3+   Urine pH <=5.0   Urine Protein Trace   Urine Urobilinogen 0.2   Urine Nitrites Neg   Urine Leukocyte Esterase Neg   Urine WBC/hpf 0 - 5/hpf   Urine RBC/hpf 40 - 60/hpf   Urine Epithelial Cells 0 - 5/hpf   Urine Bacteria NS (Not Seen)   Urine Mucous Not Present   Urine Yeast NS (Not Seen)   Urine Trichomonas Not  Present   Urine Cystals NS (Not Seen)   Urine Casts NS (Not Seen)   Urine Sperm Not Present    PROCEDURES:         [facility] - 16109  A single view of the abdomen is obtained.  Calculi:  The renal pelvis stone previously noted on [facility] imaging has fragmented into several moderate sized pieces settling into the left lower pole. The largest fragment measuring 6-33mm across. No ureteral or bladder calculi visualized.  Ureteral Stent:  In position ureteral stent. Left ureteral stent.               Urinalysis w/Scope Dipstick Dipstick Cont'd Micro  Color: Yellow Bilirubin: Neg WBC/hpf: 0 - 5/hpf  Appearance: Cloudy Ketones: Neg RBC/hpf: 40 - 60/hpf  Specific Gravity: 1.020 Blood: 3+ Bacteria: NS (Not Seen)  pH: <=5.0 Protein: Trace Cystals: NS (Not Seen)  Glucose: Neg Urobilinogen: 0.2 Casts: NS (Not Seen)    Nitrites: Neg Trichomonas: Not Present    Leukocyte Esterase: Neg Mucous: Not Present      Epithelial Cells:  0 - 5/hpf      Yeast: NS (Not Seen)      Sperm: Not Present    ASSESSMENT:      ICD-10 Details  1 GU:   Renal calculus - N20.0 Left   PLAN:           Orders Labs Urine Culture          Schedule Return Visit/Planned Activity: Next Available Appointment - Schedule Surgery          Document Letter(s):  Created for Patient: Clinical Summary         Notes:   The stone has broken up into several large fragments settling down into the lower pole of the left kidney. Stent remains in place and patient tolerating well. He has oxybutynin on hand for irritative symptoms as well as ibuprofen and oxycodone for pain. Discussed plan of care moving forward with him. Recommendation to proceed with a second shockwave lithotripsy. I consult with his urologist who also recommended the same. Patient expresses understanding. Instructed to telephone with new or worsening symptoms such as painful inability to void, uncontrollable pain, fevers.

## 2017-04-11 ENCOUNTER — Encounter (HOSPITAL_COMMUNITY): Payer: Self-pay | Admitting: *Deleted

## 2017-04-11 ENCOUNTER — Ambulatory Visit (HOSPITAL_COMMUNITY)
Admission: RE | Admit: 2017-04-11 | Discharge: 2017-04-11 | Disposition: A | Discharge status: Home / Self Care | Payer: BLUE CROSS/BLUE SHIELD | Source: Ambulatory Visit | Attending: Urology | Admitting: Urology

## 2017-04-11 ENCOUNTER — Encounter (HOSPITAL_COMMUNITY)
Admission: RE | Disposition: A | Discharge status: Home / Self Care | Payer: Self-pay | Source: Ambulatory Visit | Attending: Urology

## 2017-04-11 ENCOUNTER — Ambulatory Visit (HOSPITAL_COMMUNITY): Payer: BLUE CROSS/BLUE SHIELD

## 2017-04-11 DIAGNOSIS — N281 Cyst of kidney, acquired: Secondary | ICD-10-CM | POA: Diagnosis not present

## 2017-04-11 DIAGNOSIS — Z8249 Family history of ischemic heart disease and other diseases of the circulatory system: Secondary | ICD-10-CM | POA: Insufficient documentation

## 2017-04-11 DIAGNOSIS — E669 Obesity, unspecified: Secondary | ICD-10-CM | POA: Insufficient documentation

## 2017-04-11 DIAGNOSIS — Z6833 Body mass index (BMI) 33.0-33.9, adult: Secondary | ICD-10-CM | POA: Diagnosis not present

## 2017-04-11 DIAGNOSIS — N200 Calculus of kidney: Secondary | ICD-10-CM | POA: Diagnosis not present

## 2017-04-11 HISTORY — PX: EXTRACORPOREAL SHOCK WAVE LITHOTRIPSY: SHX1557

## 2017-04-11 SURGERY — LITHOTRIPSY, ESWL
Anesthesia: LOCAL | Laterality: Left

## 2017-04-11 MED ORDER — OXYCODONE-ACETAMINOPHEN 5-325 MG PO TABS: 1.0000 | ORAL_TABLET | ORAL | 0 refills | Status: DC | PRN

## 2017-04-11 MED ORDER — DIPHENHYDRAMINE HCL 25 MG PO CAPS
25.0000 mg | ORAL_CAPSULE | Freq: Once | ORAL | Status: AC
  Administered 2017-04-11: 25 mg via ORAL
  Filled 2017-04-11 (×2): qty 1

## 2017-04-11 MED ORDER — CIPROFLOXACIN HCL 500 MG PO TABS
500.0000 mg | ORAL_TABLET | ORAL | Status: AC
  Administered 2017-04-11: 500 mg via ORAL
  Filled 2017-04-11: qty 1

## 2017-04-11 MED ORDER — DIAZEPAM 5 MG PO TABS
10.0000 mg | ORAL_TABLET | Freq: Once | ORAL | Status: AC
  Administered 2017-04-11: 10 mg via ORAL
  Filled 2017-04-11: qty 2
  Filled 2017-04-11: qty 5

## 2017-04-11 MED ORDER — SODIUM CHLORIDE 0.9 % IV SOLN
INTRAVENOUS | Status: DC
  Administered 2017-04-11: 07:00:00 via INTRAVENOUS

## 2017-04-11 NOTE — Discharge Instructions (Addendum)
Moderate Conscious Sedation, Adult, Care After These instructions provide you with information about caring for yourself after your procedure. Your health care provider may also give you more specific instructions. Your treatment has been planned according to current medical practices, but problems sometimes occur. Call your health care provider if you have any problems or questions after your procedure. What can I expect after the procedure? After your procedure, it is common:  To feel sleepy for several hours.  To feel clumsy and have poor balance for several hours.  To have poor judgment for several hours.  To vomit if you eat too soon.  Follow these instructions at home: For at least 24 hours after the procedure:   Do not: ? Participate in activities where you could fall or become injured. ? Drive. ? Use heavy machinery. ? Drink alcohol. ? Take sleeping pills or medicines that cause drowsiness. ? Make important decisions or sign legal documents. ? Take care of children on your own.  Rest. Eating and drinking  Follow the diet recommended by your health care provider.  If you vomit: ? Drink water, juice, or soup when you can drink without vomiting. ? Make sure you have little or no nausea before eating solid foods. General instructions  Have a responsible adult stay with you until you are awake and alert.  Take over-the-counter and prescription medicines only as told by your health care provider.  If you smoke, do not smoke without supervision.  Keep all follow-up visits as told by your health care provider. This is important. Contact a health care provider if:  You keep feeling nauseous or you keep vomiting.  You feel light-headed.  You develop a rash.  You have a fever. Get help right away if:  You have trouble breathing. This information is not intended to replace advice given to you by your health care provider. Make sure you discuss any questions you have  with your health care provider. Document Released: 04/22/2013 Document Revised: 12/05/2015 Document Reviewed: 10/22/2015 Elsevier Interactive Patient Education  2018 [place] After This sheet gives you information about how to care for yourself after your procedure. Your health care provider may also give you more specific instructions. If you have problems or questions, contact your health care provider. What can I expect after the procedure? After the procedure, it is common to have:  Some blood in your urine. This should only last for a few days.  Soreness in your back, sides, or upper abdomen for a few days.  Blotches or bruises on your back where the pressure wave entered the skin.  Pain, discomfort, or nausea when pieces (fragments) of the kidney stone move through the tube that carries urine from the kidney to the bladder (ureter). Stone fragments may pass soon after the procedure, but they may continue to pass for up to 4-8 weeks. ? If you have severe pain or nausea, contact your health care provider. This may be caused by a large stone that was not broken up, and this may mean that you need more treatment.  Some pain or discomfort during urination.  Some pain or discomfort in the lower abdomen or (in men) at the base of the penis.  Follow these instructions at home: Medicines  Take over-the-counter and prescription medicines only as told by your health care provider.  If you were prescribed an antibiotic medicine, take it as told by your health care provider. Do not stop taking the antibiotic even  if you start to feel better.  Do not drive for 24 hours if you were given a medicine to help you relax (sedative).  Do not drive or use heavy machinery while taking prescription pain medicine. Eating and drinking  Drink enough water and fluids to keep your urine clear or pale yellow. This helps any remaining pieces of the stone to pass. It can also help  prevent new stones from forming.  Eat plenty of fresh fruits and vegetables.  Follow instructions from your health care provider about eating and drinking restrictions. You may be instructed: ? To reduce how much salt (sodium) you eat or drink. Check ingredients and nutrition facts on packaged foods and beverages. ? To reduce how much meat you eat.  Eat the recommended amount of calcium for your age and gender. Ask your health care provider how much calcium you should have. General instructions  Get plenty of rest.  Most people can resume normal activities 1-2 days after the procedure. Ask your health care provider what activities are safe for you.  If directed, strain all urine through the strainer that was provided by your health care provider. ? Keep all fragments for your health care provider to see. Any stones that are found may be sent to a medical lab for examination. The stone may be as small as a grain of salt.  Keep all follow-up visits as told by your health care provider. This is important. Contact a health care provider if:  You have pain that is severe or does not get better with medicine.  You have nausea that is severe or does not go away.  You have blood in your urine longer than your health care provider told you to expect.  You have more blood in your urine.  You have pain during urination that does not go away.  You urinate more frequently than usual and this does not go away.  You develop a rash or any other possible signs of an allergic reaction. Get help right away if:  You have severe pain in your back, sides, or upper abdomen.  You have severe pain while urinating.  Your urine is very dark red.  You have blood in your stool (feces).  You cannot pass any urine at all.  You feel a strong urge to urinate after emptying your bladder.  You have a fever or chills.  You develop shortness of breath, difficulty breathing, or chest pain.  You have  severe nausea that leads to persistent vomiting.  You faint. Summary  After this procedure, it is common to have some pain, discomfort, or nausea when pieces (fragments) of the kidney stone move through the tube that carries urine from the kidney to the bladder (ureter). If this pain or nausea is severe, however, you should contact your health care provider.  Most people can resume normal activities 1-2 days after the procedure. Ask your health care provider what activities are safe for you.  Drink enough water and fluids to keep your urine clear or pale yellow. This helps any remaining pieces of the stone to pass, and it can help prevent new stones from forming.  If directed, strain your urine and keep all fragments for your health care provider to see. Fragments or stones may be as small as a grain of salt.  Get help right away if you have severe pain in your back, sides, or upper abdomen or have severe pain while urinating. This information is not intended to  replace advice given to you by your health care provider. Make sure you discuss any questions you have with your health care provider. Document Released: 07/22/2007 Document Revised: 05/23/2016 Document Reviewed: 05/23/2016 Elsevier Interactive Patient Education  2017 [employer].

## 2017-04-11 NOTE — Interval H&P Note (Signed)
History and Physical Interval Note:  Patient had 32oz of water at 530 so his procedure was delayed until 12 noon.   He has residual LLP stones that are to be treated today.   04/11/2017 12:11 PM  [name]  has presented today for surgery, with the diagnosis of LEFT RENAL STONE  The various methods of treatment have been discussed with the patient and family. After consideration of risks, benefits and other options for treatment, the patient has consented to  Procedure(s): LEFT EXTRACORPOREAL SHOCK WAVE LITHOTRIPSY (ESWL) (Left) as a surgical intervention .  The patient's history has been reviewed, patient examined, no change in status, stable for surgery.  I have reviewed the patient's chart and labs.  Questions were answered to the patient's satisfaction.     Cameron Valenzuela

## 2017-04-12 ENCOUNTER — Encounter: Payer: Self-pay | Admitting: Internal Medicine

## 2017-04-12 ENCOUNTER — Ambulatory Visit (INDEPENDENT_AMBULATORY_CARE_PROVIDER_SITE_OTHER): Payer: BLUE CROSS/BLUE SHIELD | Admitting: Internal Medicine

## 2017-04-12 VITALS — BP 130/76 | HR 75 | Temp 98.2°F | Resp 14 | Ht 71.0 in | Wt 239.5 lb

## 2017-04-12 DIAGNOSIS — R0683 Snoring: Secondary | ICD-10-CM

## 2017-04-12 DIAGNOSIS — Z23 Encounter for immunization: Secondary | ICD-10-CM

## 2017-04-12 DIAGNOSIS — Z0189 Encounter for other specified special examinations: Secondary | ICD-10-CM | POA: Diagnosis not present

## 2017-04-12 MED ORDER — OXYBUTYNIN CHLORIDE 5 MG PO TABS: 5.0000 mg | ORAL_TABLET | Freq: Three times a day (TID) | ORAL | 0 refills | Status: DC

## 2017-04-12 NOTE — Assessment & Plan Note (Signed)
Snoring: epworth scale :11, which is positive, patient likely has sleep apnea, implications and risks discussed. Refer to neurology. He has gained some weight lately and plans to do better. Will return to the office at his earliest convenience for a physical exam.

## 2017-04-12 NOTE — Progress Notes (Signed)
Subjective:    Patient ID: Cameron Valenzuela, male    DOB: 06-09-1971, 46 y.o.   MRN: [id number]  DOS:  04/12/2017 Type of visit - description : acute Interval history: In the last few months, had 2 lithotripsies, the last was yesterday. Yesterday he had a hard time coming back from the anesthesia, was a snoring and his urology suspected sleep apnea. Today he reports several year history of snoring however his energy level has been usually okay.  Review of Systems Denies depression. No headache, chest pain, difficulty breathing, no lower extremity edema  Past Medical History:  Diagnosis Date  . Allergic rhinitis   . Allergy    seasonal  . Family history of adverse reaction to anesthesia    mother slow to wake up   . History of kidney stones   . Rosacea     Past Surgical History:  Procedure Laterality Date  . Cystocopy     Dr. Karsten Ro  . CYSTOSCOPY WITH STENT PLACEMENT Left 02/18/2017   Procedure: CYSTOSCOPY/ LEFT RETROGRADE/ WITH LEFT STENT PLACEMENT;  Surgeon: Kathie Rhodes, MD;  Location: [facility];  Service: Urology;  Laterality: Left;  . EXTRACORPOREAL SHOCK WAVE LITHOTRIPSY Left 02/18/2017   Procedure: LEFT EXTRACORPOREAL SHOCK WAVE LITHOTRIPSY (ESWL);  Surgeon: Franchot Gallo, MD;  Location: [facility];  Service: Urology;  Laterality: Left;  . LITHOTRIPSY     02/18/17  . neck bipsy     beningn, in his 82s    Social History   Social History  . Marital status: Married    Spouse name: Vaughan Basta  . Number of children: 1  . Years of education: N/A   Occupational History  . hotel manager     Social History Main Topics  . Smoking status: Never Smoker  . Smokeless tobacco: Never Used  . Alcohol use Yes     Comment: rarely  . Drug use: No  . Sexual activity: Yes   Other Topics Concern  . Not on file   Social History Narrative   Household-- pt, wife, child and mother in law      Allergies as of 04/12/2017   No Known Allergies     Medication List       Accurate  as of 04/12/17 12:58 PM. Always use your most recent med list.          ibuprofen 200 MG tablet Commonly known as:  ADVIL,MOTRIN Take 800 mg by mouth every 8 (eight) hours as needed (pain).   oxybutynin 5 MG tablet Commonly known as:  DITROPAN Take 1 tablet (5 mg total) by mouth 3 (three) times daily.   oxyCODONE-acetaminophen 5-325 MG tablet Commonly known as:  ROXICET Take 1 tablet by mouth every 4 (four) hours as needed for severe pain.            Discharge Care Instructions        Start     Ordered   04/12/17 0000  Flu Vaccine QUAD 6+ mos PF IM (Fluarix Quad PF)     04/12/17 0906   04/12/17 0000  Ambulatory referral to Neurology    Comments:  An appointment is requested in approximately: ASAP   04/12/17 0911   04/12/17 0000  oxybutynin (DITROPAN) 5 MG tablet  3 times daily     04/12/17 0911         Objective:   Physical Exam BP 130/76 (BP Location: Left Arm, Patient Position: Sitting, Cuff Size: Small)   Pulse 75   Temp  98.2 F (36.8 C) (Oral)   Resp 14   Ht 5\' 11"  (1.803 m)   Wt 239 lb 8 oz (108.6 kg)   SpO2 98%   BMI 33.40 kg/m  General:   Well developed, obese appearing, in no distress  HEENT:  Normocephalic . Face symmetric, atraumatic Skin: Not pale. Not jaundice Neurologic:  alert & oriented X3.  Speech normal, gait appropriate for age and unassisted Psych--  Cognition and judgment appear intact.  Cooperative with normal attention span and concentration.  Behavior appropriate. No anxious or depressed appearing.      Assessment & Plan:   Assessment Seasonal allergies Rosacea  PLAN: Snoring: epworth scale :11, which is positive, patient likely has sleep apnea, implications and risks discussed. Refer to neurology. He has gained some weight lately and plans to do better. Will return to the office at his earliest convenience for a physical exam.

## 2017-04-12 NOTE — Progress Notes (Signed)
Pre visit review using our clinic review tool, if applicable. No additional management support is needed unless otherwise documented below in the visit note. 

## 2017-04-12 NOTE — Patient Instructions (Addendum)
Referring you to neurology for evaluation of his sleep apnea  Schedule a physical exam at your convenience

## 2017-04-15 ENCOUNTER — Encounter (HOSPITAL_COMMUNITY): Payer: Self-pay | Admitting: Urology

## 2017-05-11 IMAGING — US US ABDOMEN COMPLETE
1 series · 14 of 25 positions shown · non-contrast
Comparison: None.

CLINICAL DATA: Left low back pain radiating to the left pelvis
intermittently for 1 year.

EXAM:
ABDOMEN ULTRASOUND COMPLETE

[Series 1: us abdomen complete · 0.18mm/px · 14 of 104 slices shown]
[im 1/104]
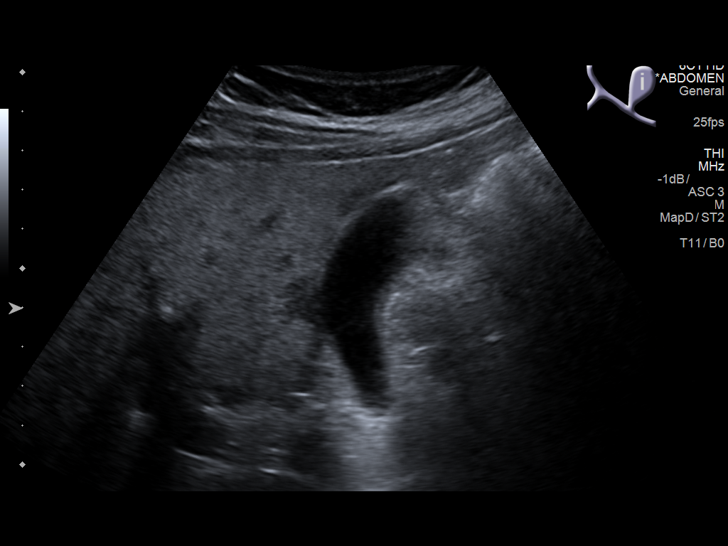
[im 9/104]
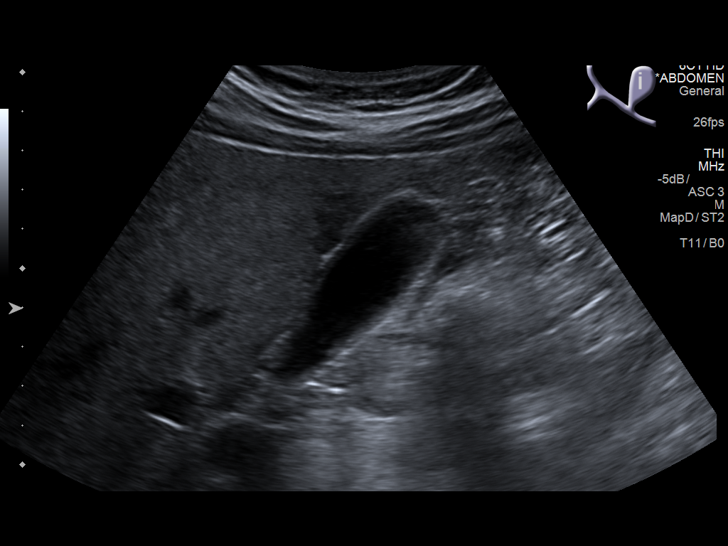
[im 18/104]
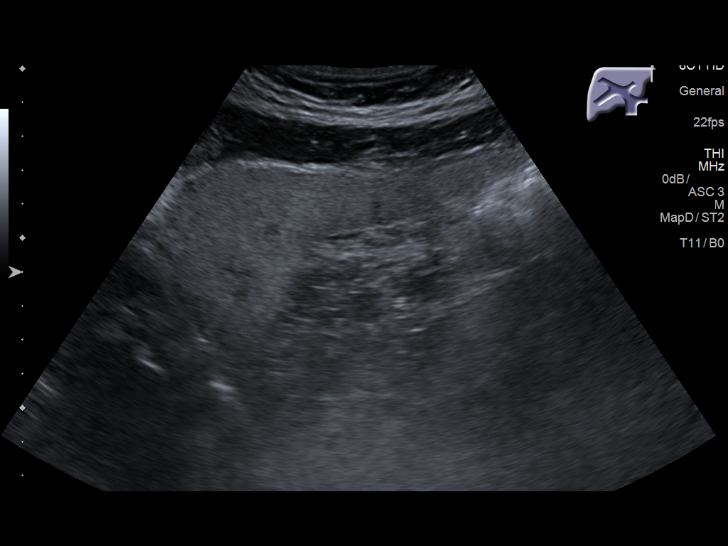
[im 26/104]
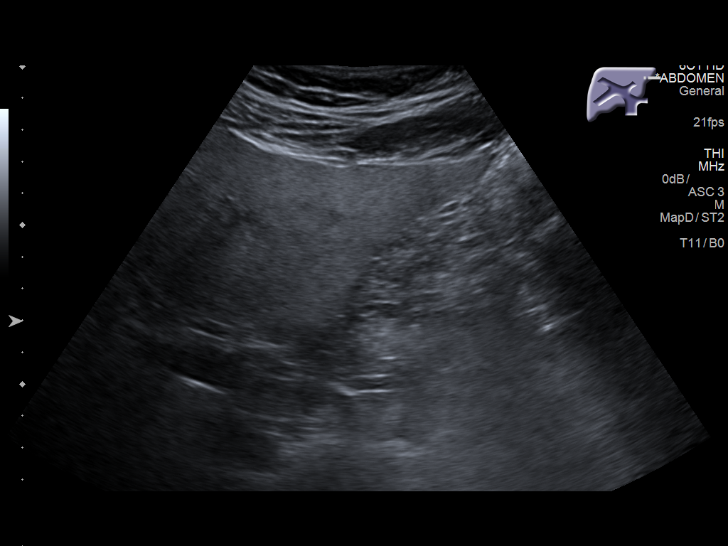
[im 35/104]
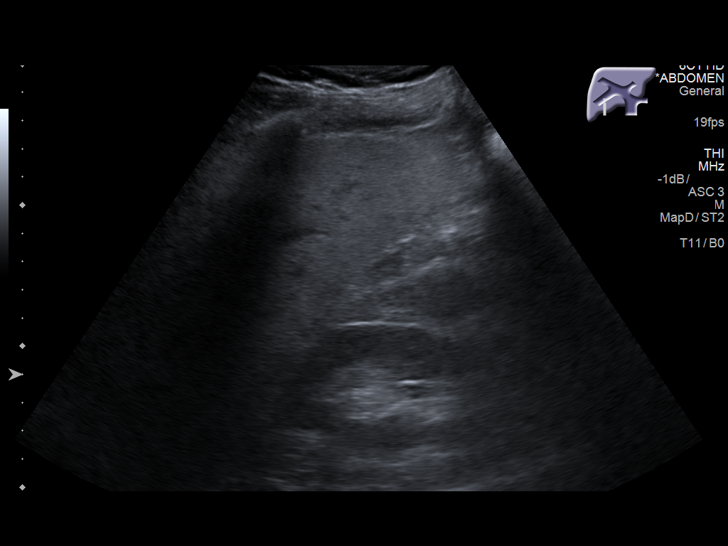
[im 39/104]
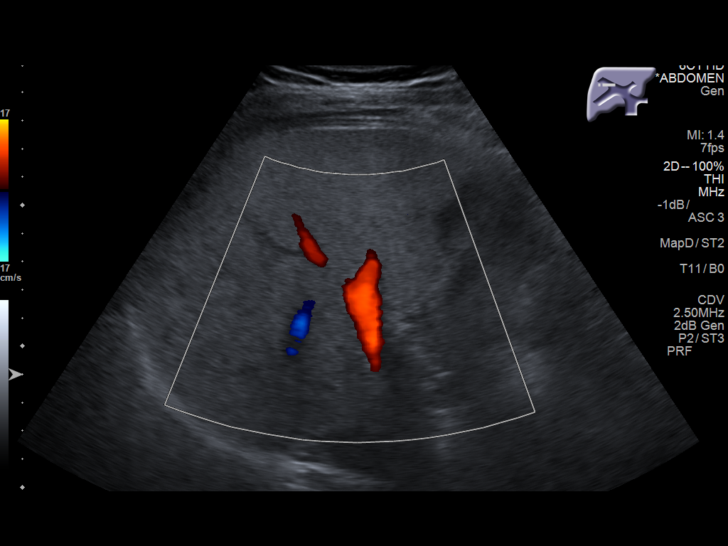
[im 48/104]
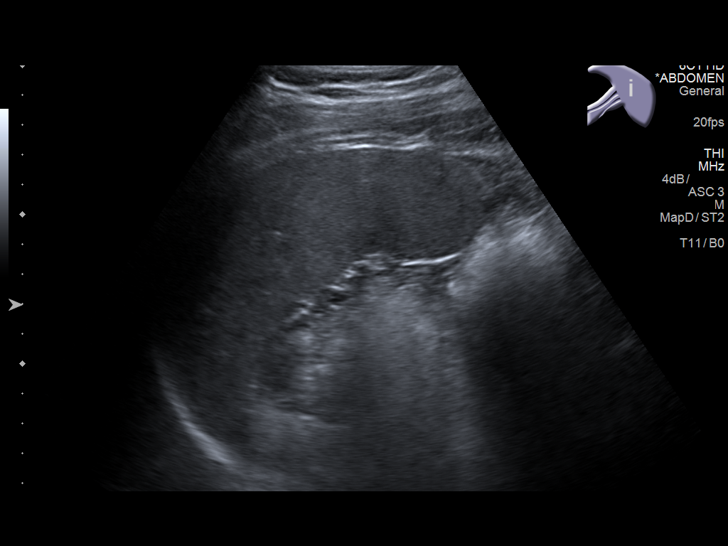
[im 56/104]
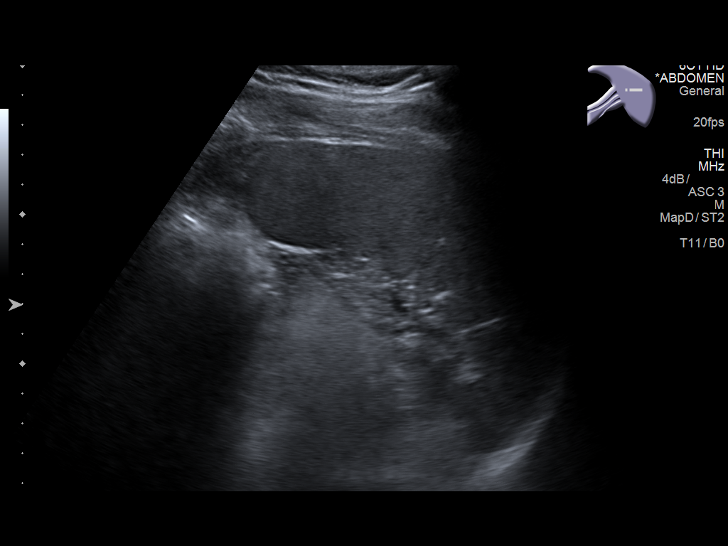
[im 65/104]
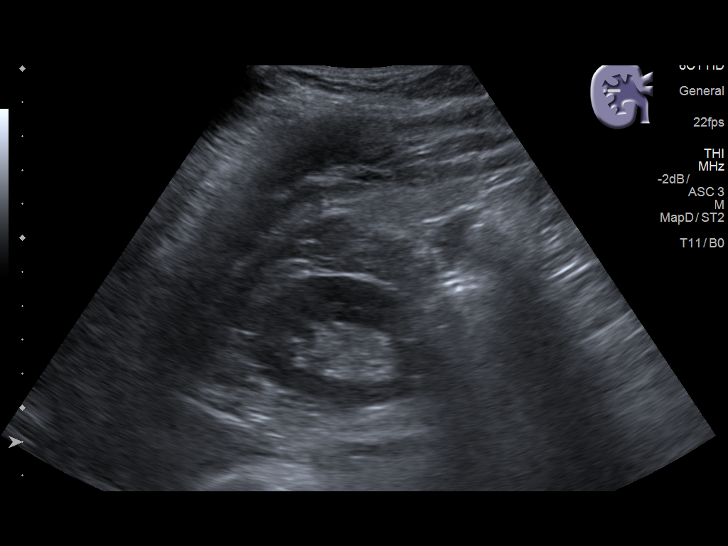
[im 69/104]
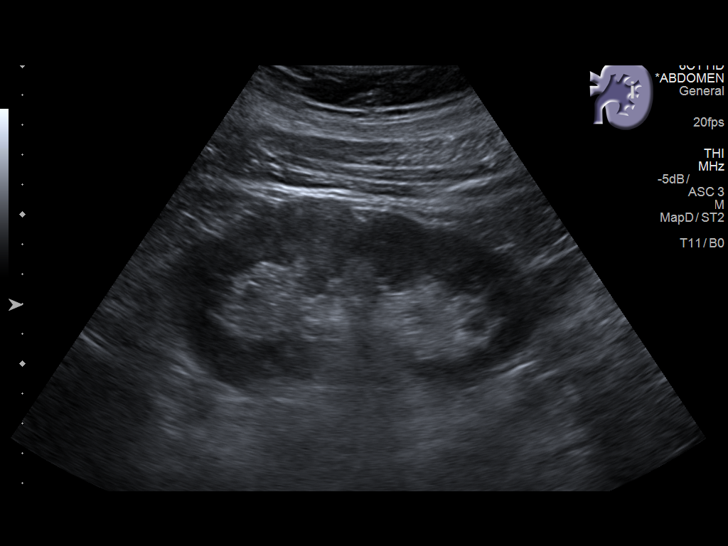
[im 78/104]
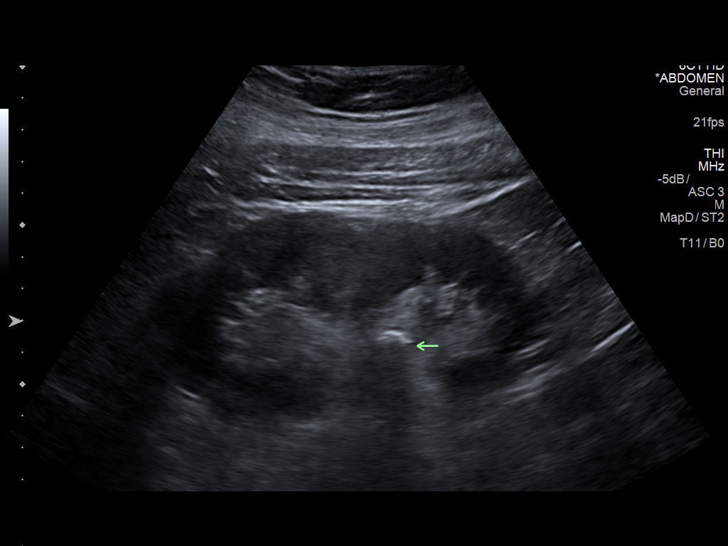
[im 86/104]
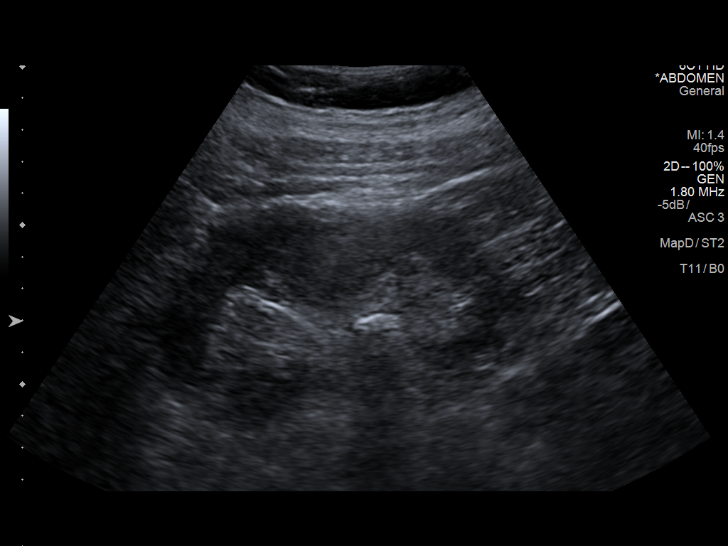
[im 95/104]
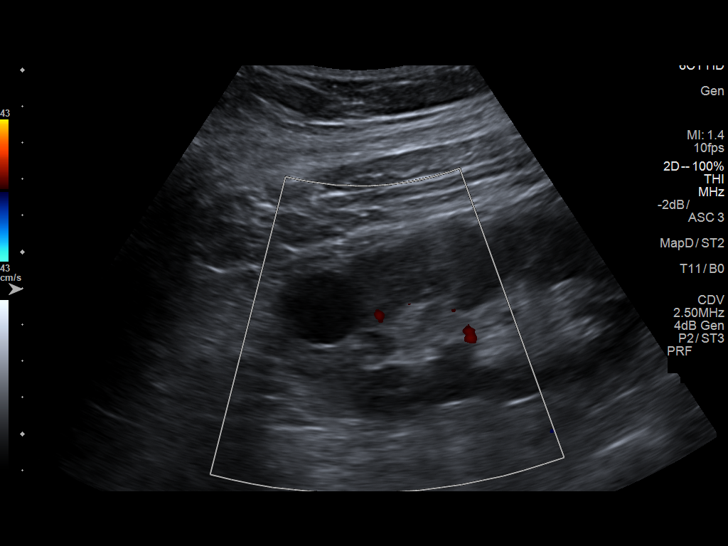
[im 104/104]
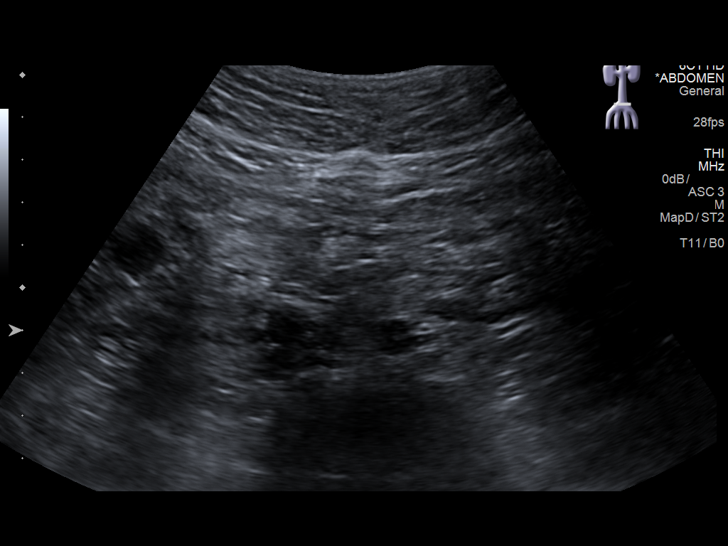

[14 of 25 positions shown; findings below may reference images not displayed]

FINDINGS: Gallbladder: No gallstones or wall thickening visualized. No
sonographic Murphy sign noted by sonographer.

Common bile duct: Diameter: 3.5 mm

Liver: No focal lesion identified. There is diffuse increased
echotexture of the liver.

IVC: Not visualized.

Pancreas: Not well seen due to overlying bowel gas.

Spleen: Size and appearance within normal limits.

Right Kidney: Length: 12 cm. Echogenicity within normal limits. No
mass or hydronephrosis visualized.

Left Kidney: Length: 12.7 cm. Echogenicity within normal limits.
There is a 2.6 cm simple cyst in the upper pole left kidney. No
hydronephrosis visualized.

Abdominal aorta: No aneurysm visualized.

Other findings: None.
IMPRESSION: No acute abnormality identified.

Fatty infiltration of liver.

Simple cyst in the left kidney.

## 2017-05-20 ENCOUNTER — Encounter: Payer: Self-pay | Admitting: Neurology

## 2017-05-20 ENCOUNTER — Ambulatory Visit (INDEPENDENT_AMBULATORY_CARE_PROVIDER_SITE_OTHER): Payer: BLUE CROSS/BLUE SHIELD | Admitting: Neurology

## 2017-05-20 VITALS — BP 123/77 | HR 68 | Ht 70.0 in | Wt 240.0 lb

## 2017-05-20 DIAGNOSIS — G4733 Obstructive sleep apnea (adult) (pediatric): Secondary | ICD-10-CM

## 2017-05-20 DIAGNOSIS — R0683 Snoring: Secondary | ICD-10-CM | POA: Diagnosis not present

## 2017-05-20 DIAGNOSIS — E669 Obesity, unspecified: Secondary | ICD-10-CM | POA: Diagnosis not present

## 2017-05-20 DIAGNOSIS — E781 Pure hyperglyceridemia: Secondary | ICD-10-CM | POA: Diagnosis not present

## 2017-05-20 NOTE — Progress Notes (Signed)
SLEEP MEDICINE CLINIC   Provider:  [name], M D  Primary Care Physician:  Colon Branch, MD   Referring Provider: Colon Branch, MD     HPI:  Cameron Valenzuela is a 46 y.o. male , seen here as in a referral from Dr. Larose Kells for snoring and witnessed apnea following a medical procedure under anesthesia.    Chief complaint according to patient : "I am not excessively tired or fatigued during the day, but the reported apnea is worrisome"  [name] is a Caucasian 46 year old married gentleman who presents following 2 lithotripsies under anesthesia- after each apnea had been noticed by medical staff. The first procedure took place in May,  the second in August 2018. The patient also reports that his body mass index and weight has fluctuated vastly, currently being at a higher end.  Last summer he lost about 35 pounds over a period of only 3 months while working out of state.  This came back with an additional weight gain, now at 240 pounds. Other diagnosis were listed below.   Sleep habits are as follows: The patient usually watches television during the last hour before he retreats to bed.  Bedtime is around 11 PM and he has no trouble to fall asleep.  He does not have a preferred sleep position, sleeps on 2 pillows, in the bedroom that is described as cool, quiet and dark.  He shares a bedroom with his wife. He does recall his dreams occasionally, dreams are not described as threatening or nightmarish in character.  He may have one nocturia break at night but not more. He wakes up spontaneously at about 5:30 AM, and he rises feeling usually refreshed and restored. He wakes with a dry mouth, rarely with headaches in season for his allergic rhinitis.   Sleep medical history and family sleep history: brother and father possibly have apnea, both a loud snorers.  His father suffered from diabetes type 2, mother and maternal grandmother were affected by breast cancer, a maternal uncle had heart  disease, strong family history of Alzheimer's in the paternal family as well as a maternal aunt.   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  Married, a daughter is 35 years old. He is a lifelong non-smoker, has 1-2 drinks a week( ETOH ), and caffeine use at 2 cups of coffee, in AM. Sodas rarely.    Review of Systems: Out of a complete 14 system review, the patient complains of only the following symptoms, and all other reviewed systems are negative.  Rosacea, sometimes headaches after not drinking his regular coffee in the morning, kidney stone related flank pain, he does not report chest pain, shortness of breath, wheezing, or ankle edema.  He reports snoring, but not fatigue with daytime sleepiness, Epworth score 10 , Fatigue severity score 20  , depression score 2/1 5    Social History   Socioeconomic History  . Marital status: Married    Spouse name: Cameron Valenzuela  . Number of children: 1  . Years of education: Not on file  . Highest education level: Not on file  Social Needs  . Financial resource strain: Not on file  . Food insecurity - worry: Not on file  . Food insecurity - inability: Not on file  . Transportation needs - medical: Not on file  . Transportation needs - non-medical: Not on file  Occupational History  . Occupation: Event organiser   Tobacco Use  . Smoking status: Never Smoker  . Smokeless tobacco: Never Used  Substance and Sexual Activity  . Alcohol use: Yes    Comment: rarely  . Drug use: No  . Sexual activity: Yes  Other Topics Concern  . Not on file  Social History Narrative   Household-- pt, wife, child and mother in law    Family History  Problem Relation Age of Onset  . Diabetes Father   . COPD Father   . Heart disease Father        ?  [name] Heart disease Maternal Grandfather   . Heart disease Paternal Grandmother   . Diabetes Other        many  fam members   . Hyperlipidemia Mother        M and others  . Colon cancer Neg Hx   . Prostate cancer Neg Hx     Past Medical History:  Diagnosis Date  . Allergic rhinitis   . Allergy    seasonal  . Family history of adverse reaction to anesthesia    mother slow to wake up   . History of kidney stones   . Rosacea     Past Surgical History:  Procedure Laterality Date  . Cystocopy     Dr. Karsten Ro  . LITHOTRIPSY     02/18/17  . neck bipsy     beningn, in his 42s    Current Outpatient Medications  Medication Sig Dispense Refill  . oxybutynin (DITROPAN) 5 MG tablet Take 1 tablet (5 mg total) by mouth 3 (three) times daily. 270 tablet 0   No current facility-administered medications for this visit.     Allergies as of 05/20/2017  . (No Known Allergies)    Vitals: BP 123/77   Pulse 68   Ht 5\' 10"  (1.778 m)   Wt 240 lb (108.9 kg)   BMI 34.44 kg/m  Last Weight:  Wt Readings from Last 1 Encounters:  05/20/17 240 lb (108.9 kg)   TKW:IOXB mass index is 34.44 kg/m.     Last Height:   Ht Readings from Last 1 Encounters:  05/20/17 5\' 10"  (1.778 m)    Physical exam:  General: The patient is awake, alert and appears not in acute distress. The patient is well groomed. Head: Normocephalic, atraumatic. Neck is supple. Mallampati 5, crowded lower jaw dentition. neck circumference:19. Nasal airflow  Patent , Retrognathia is not seen.  Cardiovascular:  Regular rate and rhythm , without  murmurs or carotid bruit, and without distended neck veins. Respiratory: Lungs are clear to auscultation. Skin:  Without evidence of edema, or rash- rosacea noted Trunk: BMI is 34.44. The patient's posture is erect.  Neurologic exam : The patient is awake and alert, oriented to place and time.   Memory subjective described as intact.  Attention span & concentration ability appears normal.  Speech is fluent,  without dysarthria, dysphonia or aphasia.  Mood and affect are  appropriate.  Cranial nerves: Pupils are equal and briskly reactive to light. Funduscopic exam without evidence of pallor or edema. Extraocular movements  in vertical and horizontal planes intact and without nystagmus. Visual fields by finger perimetry are intact. Hearing to finger rub intact.  Facial sensation intact to fine touch. Facial motor strength is symmetric and tongue and uvula move midline. Shoulder shrug was symmetrical.   Motor exam:  Normal tone, muscle bulk and symmetric strength in all extremities. Sensory:  Fine  touch, pinprick and vibration were tested in all extremities. Proprioception tested in the upper extremities was normal. Coordination: Rapid alternating movements in the fingers/hands was normal. Finger-to-nose maneuver  normal without evidence of ataxia, dysmetria or tremor. Gait and station: Patient walks without assistive device and is able unassisted to climb up to the exam table. Strength within normal limits.Stance is stable and normal. Tandem gait is unfragmented. Turns with 3 Steps.  Deep tendon reflexes: in the  upper and lower extremities are symmetric and intact. Babinski maneuver response is downgoing.   Assessment:  After physical and neurologic examination, review of laboratory studies,  Personal review of imaging studies, reports of other /same  Imaging studies, results of polysomnography and / or neurophysiology testing and pre-existing records as far as provided in visit., my assessment is   1) Mr. Evitts has been witnessed to have apnea after medical procedures that involved sedation or general anesthesia.  His wife also corroborated that he snores.  He has multiple risk factors including a high body mass index, physically not very active job description, 12-hour shifts, large neck very narrow upper airway.  Based on this constellation I would like to order a sleep study for him.  My goal is to establish if the patient also has hypoxemia at night, aside  from the expected obstructive sleep apnea manifestation.  The patient knows about his risk factors especially his weight.  Thus far he has not made a dietary change, and lacks the energy after work to establish an exercise regimen.   The patient was advised of the nature of the diagnosed disorder , the treatment options and the  risks for general health and wellness arising from not treating the condition.   I spent more than 45 minutes of face to face time with the patient.  Greater than 50% of time was spent in counseling and coordination of care. We have discussed the diagnosis and differential and I answered the patient's questions.    Plan:  Treatment plan and additional workup : PSG- SPLIT or HST.    No orders of the defined types were placed in this encounter.    No orders of the defined types were placed in this encounter.    No Follow-up on file.   [name], MD 17/01/1164, 7:90 AM  Certified in Neurology by ABPN Certified in [place] by [facility] [address], [place], [place] [address]

## 2017-05-20 NOTE — Patient Instructions (Signed)

## 2017-05-22 ENCOUNTER — Encounter: Payer: Self-pay | Admitting: Internal Medicine

## 2017-05-22 ENCOUNTER — Ambulatory Visit (INDEPENDENT_AMBULATORY_CARE_PROVIDER_SITE_OTHER): Payer: BLUE CROSS/BLUE SHIELD | Admitting: Internal Medicine

## 2017-05-22 VITALS — BP 126/76 | HR 88 | Temp 98.1°F | Resp 14 | Ht 71.0 in | Wt 239.2 lb

## 2017-05-22 DIAGNOSIS — Z0000 Encounter for general adult medical examination without abnormal findings: Secondary | ICD-10-CM | POA: Diagnosis not present

## 2017-05-22 DIAGNOSIS — L719 Rosacea, unspecified: Secondary | ICD-10-CM

## 2017-05-22 DIAGNOSIS — L989 Disorder of the skin and subcutaneous tissue, unspecified: Secondary | ICD-10-CM | POA: Diagnosis not present

## 2017-05-22 NOTE — Assessment & Plan Note (Signed)
-  Td 2016; had a flu shot  -CCS:  Not indicated, no previous colonoscopy, no FH -Prostate cancer screening: Not indicated -Diet-exercise: Discussed.  He seems to be motivated to change.  -Labs CMP, FLP, A1c

## 2017-05-22 NOTE — Patient Instructions (Signed)
  GO TO THE FRONT DESK Schedule fasting labs within a week  Schedule your next appointment for a  Physical exam in 1 year

## 2017-05-22 NOTE — Progress Notes (Signed)
Pre visit review using our clinic review tool, if applicable. No additional management support is needed unless otherwise documented below in the visit note. 

## 2017-05-22 NOTE — Assessment & Plan Note (Signed)
Snoring: Saw neurology, sleep study pending. Rosacea: We will see dermatology, he is concerned about that and also some skin lesions.  Referral sent. RTC 1 year

## 2017-05-22 NOTE — Progress Notes (Signed)
Subjective:    Patient ID: Cameron Valenzuela, male    DOB: 1971/01/18, 46 y.o.   MRN: [id number]  DOS:  05/22/2017 Type of visit - description : cpx Interval history: In general feels well. Concerned about his family history of diabetes Has a number of skin lesions and rosacea, would like to see a dermatologist. Still has a stent for kidney stones.  To see urology soon.  Wt Readings from Last 3 Encounters:  05/22/17 239 lb 4 oz (108.5 kg)  05/20/17 240 lb (108.9 kg)  04/12/17 239 lb 8 oz (108.6 kg)     Review of Systems  Other than above, a 14 point review of systems is negative     Past Medical History:  Diagnosis Date  . Allergic rhinitis   . Allergy    seasonal  . Family history of adverse reaction to anesthesia    mother slow to wake up   . History of kidney stones   . Rosacea     Past Surgical History:  Procedure Laterality Date  . Cystocopy     Dr. Karsten Ro  . LITHOTRIPSY     02/18/17  . neck bipsy     beningn, in his 66s    Social History   Socioeconomic History  . Marital status: Married    Spouse name: Vaughan Basta  . Number of children: 1  . Years of education: Not on file  . Highest education level: Not on file  Social Needs  . Financial resource strain: Not on file  . Food insecurity - worry: Not on file  . Food insecurity - inability: Not on file  . Transportation needs - medical: Not on file  . Transportation needs - non-medical: Not on file  Occupational History  . 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 Smoking status: Never Smoker  . Smokeless tobacco: Never Used  Substance and Sexual Activity  . Alcohol use: Yes    Comment: rarely  . Drug use: No  . Sexual activity: Yes  Other Topics Concern  . Not on file  Social History Narrative   Household-- pt, wife, child     (mother in law [name], passed away 11/03/15)     Family History  Problem Relation Age of Onset  . Diabetes Father   . COPD Father   . Heart disease  Father        ?  [name] Heart disease Maternal Grandfather   . Heart disease Paternal Grandmother   . Diabetes Other        many fam members   . Hyperlipidemia Mother        M and others  . Colon cancer Neg Hx   . Prostate cancer Neg Hx      Allergies as of 05/22/2017   No Known Allergies     Medication List        Accurate as of 05/22/17  8:20 PM. Always use your most recent med list.          oxybutynin 5 MG tablet Commonly known as:  DITROPAN Take 1 tablet (5 mg total) by mouth 3 (three) times daily.          Objective:   Physical Exam BP 126/76 (BP Location: Left Arm, Patient Position: Sitting, Cuff Size: Small)   Pulse 88   Temp 98.1 F (36.7 C) (Oral)   Resp 14   Ht 5\' 11"  (1.803 m)   Wt 239 lb 4 oz (108.5  kg)   SpO2 97%   BMI 33.37 kg/m  General:   Well developed,   NAD.  Neck: No  thyromegaly  HEENT:  Normocephalic . Face symmetric, atraumatic Lungs:  CTA B Normal respiratory effort, no intercostal retractions, no accessory muscle use. Heart: RRR,  no murmur.  No pretibial edema bilaterally  Abdomen:  Not distended, soft, non-tender. No rebound or rigidity.   Skin: Changes consistent with rosacea on the face Neurologic:  alert & oriented X3.  Speech normal, gait appropriate for age and unassisted Strength symmetric and appropriate for age.  Psych: Cognition and judgment appear intact.  Cooperative with normal attention span and concentration.  Behavior appropriate. No anxious or depressed appearing.     Assessment & Plan:   Assessment Seasonal allergies Rosacea Kidney stones ([facility]) Snoring   PLAN: Snoring: Saw neurology, sleep study pending. Rosacea: We will see dermatology, he is concerned about that and also some skin lesions.  Referral sent. RTC 1 year

## 2017-05-24 ENCOUNTER — Other Ambulatory Visit: Payer: BLUE CROSS/BLUE SHIELD

## 2017-06-18 ENCOUNTER — Emergency Department (HOSPITAL_BASED_OUTPATIENT_CLINIC_OR_DEPARTMENT_OTHER)
Admission: EM | Admit: 2017-06-18 | Discharge: 2017-06-18 | Disposition: A | Discharge status: Home / Self Care | Payer: BLUE CROSS/BLUE SHIELD | Attending: Emergency Medicine | Admitting: Emergency Medicine

## 2017-06-18 ENCOUNTER — Emergency Department (HOSPITAL_BASED_OUTPATIENT_CLINIC_OR_DEPARTMENT_OTHER): Payer: BLUE CROSS/BLUE SHIELD

## 2017-06-18 ENCOUNTER — Encounter (HOSPITAL_BASED_OUTPATIENT_CLINIC_OR_DEPARTMENT_OTHER): Payer: Self-pay | Admitting: Emergency Medicine

## 2017-06-18 ENCOUNTER — Other Ambulatory Visit: Payer: Self-pay

## 2017-06-18 DIAGNOSIS — Z79899 Other long term (current) drug therapy: Secondary | ICD-10-CM | POA: Diagnosis not present

## 2017-06-18 DIAGNOSIS — S0990XA Unspecified injury of head, initial encounter: Secondary | ICD-10-CM | POA: Diagnosis present

## 2017-06-18 DIAGNOSIS — Y9389 Activity, other specified: Secondary | ICD-10-CM | POA: Diagnosis not present

## 2017-06-18 DIAGNOSIS — Y92410 Unspecified street and highway as the place of occurrence of the external cause: Secondary | ICD-10-CM | POA: Diagnosis not present

## 2017-06-18 DIAGNOSIS — S0101XA Laceration without foreign body of scalp, initial encounter: Secondary | ICD-10-CM | POA: Insufficient documentation

## 2017-06-18 DIAGNOSIS — Y999 Unspecified external cause status: Secondary | ICD-10-CM | POA: Insufficient documentation

## 2017-06-18 MED ORDER — ACETAMINOPHEN 325 MG PO TABS
650.0000 mg | ORAL_TABLET | Freq: Once | ORAL | Status: AC
  Administered 2017-06-18: 650 mg via ORAL
  Filled 2017-06-18: qty 2

## 2017-06-18 NOTE — Discharge Instructions (Signed)
Please have staple removed in 5-7 days Keep area clean and dry Return if worsening

## 2017-06-18 NOTE — ED Notes (Signed)
Patient transported to CT 
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
## 2017-06-27 ENCOUNTER — Ambulatory Visit (INDEPENDENT_AMBULATORY_CARE_PROVIDER_SITE_OTHER): Payer: BLUE CROSS/BLUE SHIELD | Admitting: Neurology

## 2017-06-27 DIAGNOSIS — E781 Pure hyperglyceridemia: Secondary | ICD-10-CM

## 2017-06-27 DIAGNOSIS — G4733 Obstructive sleep apnea (adult) (pediatric): Secondary | ICD-10-CM

## 2017-06-27 DIAGNOSIS — E669 Obesity, unspecified: Secondary | ICD-10-CM

## 2017-06-27 DIAGNOSIS — R0683 Snoring: Secondary | ICD-10-CM

## 2017-06-28 ENCOUNTER — Encounter: Payer: Self-pay | Admitting: Internal Medicine

## 2017-06-28 ENCOUNTER — Ambulatory Visit: Payer: BLUE CROSS/BLUE SHIELD | Admitting: Internal Medicine

## 2017-06-28 DIAGNOSIS — S0101XA Laceration without foreign body of scalp, initial encounter: Secondary | ICD-10-CM | POA: Diagnosis not present

## 2017-06-28 DIAGNOSIS — Z0000 Encounter for general adult medical examination without abnormal findings: Secondary | ICD-10-CM | POA: Diagnosis not present

## 2017-06-28 LAB — COMPREHENSIVE METABOLIC PANEL
ALT: 33 U/L (ref 0–53)
AST: 23 U/L (ref 0–37)
Albumin: 4.2 g/dL (ref 3.5–5.2)
Alkaline Phosphatase: 60 U/L (ref 39–117)
BUN: 15 mg/dL (ref 6–23)
CO2: 33 mEq/L — ABNORMAL HIGH (ref 19–32)
Calcium: 9.1 mg/dL (ref 8.4–10.5)
Chloride: 106 mEq/L (ref 96–112)
Creatinine, Ser: 1.11 mg/dL (ref 0.40–1.50)
GFR: 75.25 mL/min (ref >60.00)
Glucose, Bld: 92 mg/dL (ref 70–99)
Potassium: 4.6 mEq/L (ref 3.5–5.1)
Sodium: 142 mEq/L (ref 135–145)
Total Bilirubin: 0.5 mg/dL (ref 0.2–1.2)
Total Protein: 6.7 g/dL (ref 6.0–8.3)

## 2017-06-28 LAB — LDL CHOLESTEROL, DIRECT: Direct LDL: 120 mg/dL

## 2017-06-28 LAB — HEMOGLOBIN A1C: Hgb A1c MFr Bld: 5.6 % (ref 4.6–6.5)

## 2017-06-28 LAB — LIPID PANEL
Cholesterol: 201 mg/dL — ABNORMAL HIGH (ref 0–200)
HDL: 27.5 mg/dL — ABNORMAL LOW (ref >39.00)
Total CHOL/HDL Ratio: 7
Triglycerides: 470 mg/dL — ABNORMAL HIGH (ref 0.0–149.0)

## 2017-06-28 NOTE — Progress Notes (Signed)
Pre visit review using our clinic review tool, if applicable. No additional management support is needed unless otherwise documented below in the visit note. 

## 2017-06-28 NOTE — Patient Instructions (Signed)
  GO TO THE LAB : Get the blood work     
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
## 2017-07-04 ENCOUNTER — Other Ambulatory Visit: Payer: Self-pay | Admitting: Neurology

## 2017-07-04 DIAGNOSIS — Z09 Encounter for follow-up examination after completed treatment for conditions other than malignant neoplasm: Secondary | ICD-10-CM

## 2017-07-04 DIAGNOSIS — G4733 Obstructive sleep apnea (adult) (pediatric): Secondary | ICD-10-CM

## 2017-07-04 DIAGNOSIS — G4734 Idiopathic sleep related nonobstructive alveolar hypoventilation: Principal | ICD-10-CM

## 2017-07-04 NOTE — Procedures (Signed)
PATIENT'S NAME:  Cameron Valenzuela, Cameron Valenzuela DOB:      Sep 28, 1970      MR#:    [id number]     DATE OF RECORDING: 06/27/2017 REFERRING M.D.:  Kathlene November, M.D. Study Performed:  Split-Night Titration Study HISTORY:  46 year old male patient was noted to have apnea after a medical procedure with anesthesia. The patient endorsed the Epworth Sleepiness Scale at 10 points. The patient's weight 240 pounds with a height of 70 (inches), resulting in a BMI of 34.4 kg/m2.The patient's neck circumference measured 19 inches.  CURRENT MEDICATIONS: Ditropan   PROCEDURE:  This is a multichannel digital polysomnogram utilizing the Somnostar 11.2 system.  Electrodes and sensors were applied and monitored per AASM Specifications.   EEG, EOG, Chin and Limb EMG, were sampled at 200 Hz.  ECG, Snore and Nasal Pressure, Thermal Airflow, Respiratory Effort, CPAP Flow and Pressure, Oximetry was sampled at 50 Hz. Digital video and audio were recorded.      BASELINE STUDY WITHOUT CPAP RESULTS:  Lights Out was at 22:37 and Lights On at 05:00.  Total recording time (TRT) was 111.5, with a total sleep time (TST) of 94 minutes. The patient's sleep latency was 12 minutes.  REM latency was 0 minutes.  The sleep efficiency was 84.3 %.    SLEEP ARCHITECTURE: WASO (Wake after sleep onset) was 3.5 minutes, Stage N1 was 6 minutes, Stage N2 was 49.5 minutes, Stage N3 was 38.5 minutes and Stage R (REM sleep) was 0 minutes.  The percentages were Stage N1 6.4%, Stage N2 52.7%, Stage N3 41.% and Stage R (REM sleep) 0%.  RESPIRATORY ANALYSIS:  There were a total of 105 respiratory events:  105 obstructive apneas, 0 central apneas and 0 mixed apneas with a total of 105 apneas and an apnea index (AI) of 67.. There were 0 hypopneas with a hypopnea index of 0. The patient also had 0 respiratory event related arousals (RERAs).  Snoring was noted.    The total APNEA/HYPOPNEA INDEX (AHI) was 67.0 /hour and the total RESPIRATORY DISTURBANCE INDEX was 67.0 /hour.   0 events occurred in REM sleep and 0 events in NREM. The REM AHI was 0.0 /hour versus a non-REM AHI of 67.0 /hour. The patient spent 241 minutes sleep time in the supine position, and 98 minutes in non-supine. The supine AHI was 120.0 /hour versus a non-supine AHI of 4.2 /hour.  OXYGEN SATURATION & C02:  The wake baseline 02 saturation was 92%, with the lowest being 75%. Time spent below 89% saturation equaled 45 minutes.  PERIODIC LIMB MOVEMENTS:    The patient had a total of 0 Periodic Limb Movements.  The arousals were noted as: 2 were spontaneous, 0 were associated with PLMs, and 12 were associated with respiratory events. Audio and video analysis did not show any abnormal or unusual movements, behaviors, phonations or vocalizations EKG was in keeping with normal sinus rhythm (NSR)    TITRATION STUDY WITH CPAP RESULTS:   CPAP was initiated at 5 cmH20 with heated humidity per AASM split night standards and pressure was advanced to 12 cmH20 because of hypopneas, apneas and desaturations.  At a PAP pressure of 12 cmH20, there was a reduction of the AHI to 0.0 /hour.   Total recording time (TRT) was 272 minutes, with a total sleep time (TST) of 245 minutes. The patient's sleep latency was 5 minutes. REM latency was 12.5 minutes.  The sleep efficiency was 90.1 %.    SLEEP ARCHITECTURE: Wake after sleep was  21.5 minutes, Stage N1 12 minutes, Stage N2 117.5 minutes, Stage N3 47 minutes and Stage R (REM sleep) 68.5 minutes. The percentages were: Stage N1 4.9%, Stage N2 48.%, Stage N3 19.2% and Stage R (REM sleep) 28.%. The sleep architecture was notable for REM rebound. The arousals were noted as: 14 were spontaneous, 0 were associated with PLMs, and 3 were associated with respiratory events.  RESPIRATORY ANALYSIS:  There were a total of 16 respiratory events: 0 obstructive apneas, 6 central apneas and 0 mixed apneas with a total of 6 apneas and an apnea index (AI) of 1.5. There were 10 hypopneas  with a hypopnea index of 2.4 /hour. The patient also had 0 respiratory event related arousals (RERAs).      The total APNEA/HYPOPNEA INDEX (AHI) was 3.9 /hour and the total RESPIRATORY DISTURBANCE INDEX was 3.9 /hour.  1 event occurred in REM sleep and 15 events in NREM. The REM AHI was .9 /hour versus a non-REM AHI of 5.1 /hour. REM sleep was achieved on a pressure of 9 cm/H20 and increased with CPAP pressure. The patient spent 78% of total sleep time in the supine position. The supine AHI was 5.1 /hour, versus a non-supine AHI of 0.0/hour.  OXYGEN SATURATION & C02:  The wake baseline 02 saturation was 97%, with the lowest being 84%. Time spent below 89% saturation equaled 3 minutes.  PERIODIC LIMB MOVEMENTS:   The patient had a total of 0 Periodic Limb Movements.   POLYSOMNOGRAPHY IMPRESSION :   1. Severe Obstructive Sleep Apnea (OSA) with hypoxemia. 2. Loud Snoring 3. CPAP at 12 cm water with 2 cm EPR eliminated hypoxemia, snoring and apnea.   RECOMMENDATIONS: Advise to start auto CPAP at 7-12 cm water with EPR at 2 cmH2O and a ResMed nasal pillow, AirFit P10, in medium size. 1. Avoid alcohol and sedative-hypnotics which may worsen sleep apnea (as applicable). 2. Advise to lose weight by diet and exercise, if not contraindicated (BMI exceeds 30). 3. Further information regarding OSA may be obtained from [employer] (www.sleepfoundation.org) or American Sleep Apnea Association (www.sleepapnea.org). 4. A follow up appointment will be scheduled in the Sleep Clinic at [facility].      I certify that I have reviewed the entire raw data recording prior to the issuance of this report in accordance with the Standards of Accreditation of the American Academy of Sleep Medicine (AASM)    [name], M.D.  07-04-2017  Diplomat, American Board of Psychiatry and Neurology  Diplomat, [place] [facility] Medical Director, [place] [facility] at  [facility]

## 2017-07-05 ENCOUNTER — Telehealth: Payer: Self-pay | Admitting: Neurology

## 2017-07-05 NOTE — Telephone Encounter (Signed)
-----   Message from [name], MD sent at 07/04/2017  5:37 PM EST ----- Severe sleep apnea was noted, the baseline part of his SPLIT protocol study did not document any REM sleep. Severe hypoxemia and loud snoring. CPAP was effective and best at 12 cm water, nasal pillow.

## 2017-07-05 NOTE — Telephone Encounter (Signed)
I called pt. I advised pt that Dr. Brett Fairy reviewed their sleep study results and found that pt has sleep apnea. Dr. Brett Fairy recommends that pt starts a auto CPAP. I reviewed PAP compliance expectations with the pt. Pt is agreeable to starting a CPAP. I advised pt that an order will be sent to a DME, [facility], and [facility] will call the pt within about one week after they file with the pt's insurance. [facility] will show the pt how to use the machine, fit for masks, and troubleshoot the CPAP if needed. A follow up appt was made for insurance purposes with Cecille Rubin, NP  on October 03, 2017 at 8:45 am. Pt verbalized understanding to arrive 15 minutes early and bring their CPAP. A letter with all of this information in it will be mailed to the pt as a reminder. I verified with the pt that the address we have on file is correct. Pt verbalized understanding of results. Pt had no questions at this time but was encouraged to call back if questions arise.

## 2017-10-01 NOTE — Progress Notes (Deleted)
[facility]  PATIENT: Cameron Valenzuela DOB: Dec 13, 1970   REASON FOR VISIT: Follow-up for newly diagnosed obstructive sleep apnea with initial CPAP compliance HISTORY FROM:    HISTORY OF PRESENT ILLNESS:  Cameron Valenzuela is a 47 y.o. male , seen here as in a referral from Dr. Larose Kells for snoring and witnessed apnea following a medical procedure under anesthesia.    Chief complaint according to patient : "I am not excessively tired or fatigued during the day, but the reported apnea is worrisome"  Cameron Valenzuela is a Caucasian 47 year old married gentleman who presents following 2 lithotripsies under anesthesia- after each apnea had been noticed by medical staff. The first procedure took place in May,  the second in August 2018. The patient also reports that his body mass index and weight has fluctuated vastly, currently being at a higher end.  Last summer he lost about 35 pounds over a period of only 3 months while working out of state.  This came back with an additional weight gain, now at 240 pounds. Other diagnosis were listed below.   Sleep habits are as follows: The patient usually watches television during the last hour before he retreats to bed.  Bedtime is around 11 PM and he has no trouble to fall asleep.  He does not have a preferred sleep position, sleeps on 2 pillows, in the bedroom that is described as cool, quiet and dark.  He shares a bedroom with his wife. He does recall his dreams occasionally, dreams are not described as threatening or nightmarish in character.  He may have one nocturia break at night but not more. He wakes up spontaneously at about 5:30 AM, and he rises feeling usually refreshed and restored. He wakes with a dry mouth, rarely with headaches in season for his allergic rhinitis.   REVIEW OF SYSTEMS: Full 14 system review of systems performed and notable only for those listed, all others are neg:  Constitutional: neg  Cardiovascular:  neg Ear/Nose/Throat: neg  Skin: neg Eyes: neg Respiratory: neg Gastroitestinal: neg  Hematology/Lymphatic: neg  Endocrine: neg Musculoskeletal:neg Allergy/Immunology: neg Neurological: neg Psychiatric: neg Sleep : neg   ALLERGIES: No Known Allergies  HOME MEDICATIONS: Outpatient Medications Prior to Visit  Medication Sig Dispense Refill  . doxycycline (VIBRAMYCIN) 100 MG capsule Take 100 mg by mouth 2 (two) times daily.  2  . oxybutynin (DITROPAN) 5 MG tablet Take 1 tablet (5 mg total) by mouth 3 (three) times daily. (Patient not taking: Reported on 06/28/2017) 270 tablet 0  . tamsulosin (FLOMAX) 0.4 MG CAPS capsule Take 0.4 mg by mouth daily.  2   No facility-administered medications prior to visit.     PAST MEDICAL HISTORY: Past Medical History:  Diagnosis Date  . Allergic rhinitis   . Allergy    seasonal  . Family history of adverse reaction to anesthesia    mother slow to wake up   . History of kidney stones   . Rosacea     PAST SURGICAL HISTORY: Past Surgical History:  Procedure Laterality Date  . Cystocopy     Dr. Karsten Ro  . CYSTOSCOPY WITH STENT PLACEMENT Left 02/18/2017   Procedure: CYSTOSCOPY/ LEFT RETROGRADE/ WITH LEFT STENT PLACEMENT;  Surgeon: Kathie Rhodes, MD;  Location: [facility];  Service: Urology;  Laterality: Left;  . EXTRACORPOREAL SHOCK WAVE LITHOTRIPSY Left 02/18/2017   Procedure: LEFT EXTRACORPOREAL SHOCK WAVE LITHOTRIPSY (ESWL);  Surgeon: Franchot Gallo, MD;  Location: [facility];  Service: Urology;  Laterality: Left;  .  EXTRACORPOREAL SHOCK WAVE LITHOTRIPSY Left 04/11/2017   Procedure: LEFT EXTRACORPOREAL SHOCK WAVE LITHOTRIPSY (ESWL);  Surgeon: Irine Seal, MD;  Location: [facility];  Service: Urology;  Laterality: Left;  . LITHOTRIPSY     02/18/17  . neck bipsy     beningn, in his 25s    FAMILY HISTORY: Family History  Problem Relation Age of Onset  . Diabetes Father   . COPD Father   . Heart disease Father        ?  [name] Heart disease Maternal  Grandfather   . Heart disease Paternal Grandmother   . Diabetes Other        many fam members   . Hyperlipidemia Mother        M and others  . Colon cancer Neg Hx   . Prostate cancer Neg Hx     SOCIAL HISTORY: Social History   Socioeconomic History  . Marital status: Married    Spouse name: Vaughan Basta  . Number of children: 1  . Years of education: Not on file  . Highest education level: Not on file  Social Needs  . Financial resource strain: Not on file  . Food insecurity - worry: Not on file  . Food insecurity - inability: Not on file  . Transportation needs - medical: Not on file  . Transportation needs - non-medical: Not on file  Occupational History  . 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 Smoking status: Never Smoker  . Smokeless tobacco: Never Used  Substance and Sexual Activity  . Alcohol use: Yes    Comment: rarely  . Drug use: No  . Sexual activity: Yes  Other Topics Concern  . Not on file  Social History Narrative   Household-- pt, wife, child     (mother in law [employer], passed away 10-17-15)     PHYSICAL EXAM  There were no vitals filed for this visit. There is no height or weight on file to calculate BMI.  Generalized: Well developed, in no acute distress  Head: normocephalic and atraumatic,. Oropharynx benign  Neck: Supple, no carotid bruits  Cardiac: Regular rate rhythm, no murmur  Musculoskeletal: No deformity   Neurological examination   Mentation: Alert oriented to time, place, history taking. Attention span and concentration appropriate. Recent and remote memory intact.  Follows all commands speech and language fluent.   Cranial nerve II-XII: Fundoscopic exam reveals sharp disc margins.Pupils were equal round reactive to light extraocular movements were full, visual field were full on confrontational test. Facial sensation and strength were normal. hearing was intact to finger rubbing bilaterally. Uvula tongue midline. head  turning and shoulder shrug were normal and symmetric.Tongue protrusion into cheek strength was normal. Motor: normal bulk and tone, full strength in the BUE, BLE, fine finger movements normal, no pronator drift. No focal weakness Sensory: normal and symmetric to light touch, pinprick, and  Vibration, proprioception  Coordination: finger-nose-finger, heel-to-shin bilaterally, no dysmetria Reflexes: Brachioradialis 2/2, biceps 2/2, triceps 2/2, patellar 2/2, Achilles 2/2, plantar responses were flexor bilaterally. Gait and Station: Rising up from seated position without assistance, normal stance,  moderate stride, good arm swing, smooth turning, able to perform tiptoe, and heel walking without difficulty. Tandem gait is steady  DIAGNOSTIC DATA (LABS, IMAGING, TESTING) - I reviewed patient records, labs, notes, testing and imaging myself where available.  Lab Results  Component Value Date   WBC 4.7 02/06/2017   HGB 14.6 02/06/2017   HCT 43.4 02/06/2017   MCV 92.1  02/06/2017   PLT 162 02/06/2017      Component Value Date/Time   NA 142 06/28/2017 1127   K 4.6 06/28/2017 1127   CL 106 06/28/2017 1127   CO2 33 (H) 06/28/2017 1127   GLUCOSE 92 06/28/2017 1127   BUN 15 06/28/2017 1127   CREATININE 1.11 06/28/2017 1127   CALCIUM 9.1 06/28/2017 1127   PROT 6.7 06/28/2017 1127   ALBUMIN 4.2 06/28/2017 1127   AST 23 06/28/2017 1127   ALT 33 06/28/2017 1127   ALKPHOS 60 06/28/2017 1127   BILITOT 0.5 06/28/2017 1127   GFRNONAA >60 02/06/2017 1025   GFRAA >60 02/06/2017 1025   Lab Results  Component Value Date   CHOL 201 (H) 06/28/2017   HDL 27.50 (L) 06/28/2017   LDLCALC 147 (H) 03/12/2016   LDLDIRECT 120.0 06/28/2017   TRIG (H) 06/28/2017    470.0 Triglyceride is over 400; calculations on Lipids are invalid.   CHOLHDL 7 06/28/2017   Lab Results  Component Value Date   HGBA1C 5.6 06/28/2017   No results found for: RPRXYVOP92 Lab Results  Component Value Date   TSH 2.25  03/12/2016    ***  ASSESSMENT AND PLAN  47 y.o. year old male  has a past medical history of Allergic rhinitis, Allergy, Family history of adverse reaction to anesthesia, History of kidney stones, and Rosacea. here with ***    Rayburn Ma, [facility], APRN  [facility] [address] [place], [place] [address] [phone]

## 2017-10-03 ENCOUNTER — Ambulatory Visit: Payer: Self-pay | Admitting: Nurse Practitioner

## 2017-10-25 NOTE — Progress Notes (Signed)
[facility]  PATIENT: Cameron Valenzuela DOB: [birth year]   REASON FOR VISIT: Follow-up for newly diagnosed obstructive sleep apnea with initial CPAP HISTORY FROM: Patient   HISTORY OF PRESENT ILLNESS:UPDATE 4/15/2019[name], 47 year old male returns for follow-up with newly diagnosed obstructive sleep apnea here for initial CPAP compliance.  He denies difficulty adjusting to the machine.  Compliance data dated 09/24/2017-10/23/2017 shows compliance data greater than 4 hours at 97%.  Average usage 6 hours 8 minutes.  Set pressure 7-12 cm.  EPR level 3.  AHI 6.9.  ESS 4. FSS 36.  He returns for reevaluation   11/5/18[name] is a 47 y.o. male , seen here as in a referral from Dr. Larose Kells for snoring and witnessed apnea following a medical procedure under anesthesia.  Chief complaint according to patient : "I am not excessively tired or fatigued during the day, but the reported apnea is worrisome"  Cameron Valenzuela is a Caucasian 47 year old married gentleman who presents following 2 lithotripsies under anesthesia- after each apnea had been noticed by medical staff. The first procedure took place in May,  the second in August 2018. The patient also reports that his body mass index and weight has fluctuated vastly, currently being at a higher end.  Last summer he lost about 35 pounds over a period of only 3 months while working out of state.  This came back with an additional weight gain, now at 240 pounds. Other diagnosis were listed below.   Sleep habits are as follows: The patient usually watches television during the last hour before he retreats to bed.  Bedtime is around 11 PM and he has no trouble to fall asleep.  He does not have a preferred sleep position, sleeps on 2 pillows, in the bedroom that is described as cool, quiet and dark.  He shares a bedroom with his wife. He does recall his dreams occasionally, dreams are not described as threatening or nightmarish  in character.  He may have one nocturia break at night but not more. He wakes up spontaneously at about 5:30 AM, and he rises feeling usually refreshed and restored. He wakes with a dry mouth, rarely with headaches in season for his allergic rhinitis.     REVIEW OF SYSTEMS: Full 14 system review of systems performed and notable only for those listed, all others are neg:  Constitutional: neg  Cardiovascular: neg Ear/Nose/Throat: neg  Skin: neg Eyes: neg Respiratory: neg Gastroitestinal: neg  Hematology/Lymphatic: neg  Endocrine: neg Musculoskeletal:neg Allergy/Immunology: Environmental allergies Neurological: neg Psychiatric: neg Sleep : Obstructive sleep apnea with CPAP   ALLERGIES: No Known Allergies  HOME MEDICATIONS: Outpatient Medications Prior to Visit  Medication Sig Dispense Refill  . doxycycline (VIBRAMYCIN) 100 MG capsule Take 100 mg by mouth 2 (two) times daily.  2  . oxybutynin (DITROPAN) 5 MG tablet Take 1 tablet (5 mg total) by mouth 3 (three) times daily. (Patient not taking: Reported on 06/28/2017) 270 tablet 0  . tamsulosin (FLOMAX) 0.4 MG CAPS capsule Take 0.4 mg by mouth daily.  2   No facility-administered medications prior to visit.     PAST MEDICAL HISTORY: Past Medical History:  Diagnosis Date  . Allergic rhinitis   . Allergy    seasonal  . Family history of adverse reaction to anesthesia    mother slow to wake up   . History of kidney stones   . Rosacea     PAST SURGICAL HISTORY: Past Surgical History:  Procedure Laterality Date  .  Cystocopy     Dr. Karsten Ro  . CYSTOSCOPY WITH STENT PLACEMENT Left 02/18/2017   Procedure: CYSTOSCOPY/ LEFT RETROGRADE/ WITH LEFT STENT PLACEMENT;  Surgeon: Kathie Rhodes, MD;  Location: [facility];  Service: Urology;  Laterality: Left;  . EXTRACORPOREAL SHOCK WAVE LITHOTRIPSY Left 02/18/2017   Procedure: LEFT EXTRACORPOREAL SHOCK WAVE LITHOTRIPSY (ESWL);  Surgeon: Franchot Gallo, MD;  Location: [facility];  Service:  Urology;  Laterality: Left;  . EXTRACORPOREAL SHOCK WAVE LITHOTRIPSY Left 04/11/2017   Procedure: LEFT EXTRACORPOREAL SHOCK WAVE LITHOTRIPSY (ESWL);  Surgeon: Irine Seal, MD;  Location: [facility];  Service: Urology;  Laterality: Left;  . LITHOTRIPSY     02/18/17  . neck bipsy     beningn, in his 3s    FAMILY HISTORY: Family History  Problem Relation Age of Onset  . Diabetes Father   . COPD Father   . Heart disease Father        ?  [name] Heart disease Maternal Grandfather   . Heart disease Paternal Grandmother   . Diabetes Other        many fam members   . Hyperlipidemia Mother        M and others  . Colon cancer Neg Hx   . Prostate cancer Neg Hx     SOCIAL HISTORY: Social History   Socioeconomic History  . Marital status: Married    Spouse name: Vaughan Basta  . Number of children: 1  . Years of education: Not on file  . Highest education level: Not on file  Occupational History  . 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 Financial resource strain: Not on file  . Food insecurity:    Worry: Not on file    Inability: Not on file  . Transportation needs:    Medical: Not on file    Non-medical: Not on file  Tobacco Use  . Smoking status: Never Smoker  . Smokeless tobacco: Never Used  Substance and Sexual Activity  . Alcohol use: Yes    Comment: rarely  . Drug use: No  . Sexual activity: Yes  Lifestyle  . Physical activity:    Days per week: Not on file    Minutes per session: Not on file  . Stress: Not on file  Relationships  . Social connections:    Talks on phone: Not on file    Gets together: Not on file    Attends religious service: Not on file    Active member of club or organization: Not on file    Attends meetings of clubs or organizations: Not on file    Relationship status: Not on file  . Intimate partner violence:    Fear of current or ex partner: Not on file    Emotionally abused: Not on file    Physically abused: Not on file    Forced  sexual activity: Not on file  Other Topics Concern  . Not on file  Social History Narrative   Household-- pt, wife, child     (mother in law Wise, passed away Nov 05, 2015)     PHYSICAL EXAM  Vitals:   10/28/17 0857  BP: 131/73  Pulse: 78  Weight: 254 lb 6.4 oz (115.4 kg)  Height: 5\' 11"  (1.803 m)   Body mass index is 35.48 kg/m.  Generalized: Well developed, obese male in no acute distress  Head: normocephalic and atraumatic,. Oropharynx benign mallopatti 4-5 Neck: Supple, neck circumference 19 Musculoskeletal: No deformity   Neurological examination  Mentation: Alert oriented to time, place, history taking. Attention span and concentration appropriate. Recent and remote memory intact.  Follows all commands speech and language fluent. ESS 4  Cranial nerve II-XII: Pupils were equal round reactive to light extraocular movements were full, visual field were full on confrontational test. Facial sensation and strength were normal. hearing was intact to finger rubbing bilaterally. Uvula tongue midline. head turning and shoulder shrug were normal and symmetric.Tongue protrusion into cheek strength was normal. Motor: normal bulk and tone, full strength in the BUE, BLE,  Sensory: normal and symmetric to light touch,  Coordination: finger-nose-finger, heel-to-shin bilaterally, no dysmetria Gait and Station: Rising up from seated position without assistance, normal stance,  moderate stride, good arm swing, smooth turning, able to perform tiptoe, and heel walking without difficulty. Tandem gait is steady  DIAGNOSTIC DATA (LABS, IMAGING, TESTING) - I reviewed patient records, labs, notes, testing and imaging myself where available.  Lab Results  Component Value Date   WBC 4.7 02/06/2017   HGB 14.6 02/06/2017   HCT 43.4 02/06/2017   MCV 92.1 02/06/2017   PLT 162 02/06/2017      Component Value Date/Time   NA 142 06/28/2017 1127   K 4.6 06/28/2017 1127   CL 106 06/28/2017 1127    CO2 33 (H) 06/28/2017 1127   GLUCOSE 92 06/28/2017 1127   BUN 15 06/28/2017 1127   CREATININE 1.11 06/28/2017 1127   CALCIUM 9.1 06/28/2017 1127   PROT 6.7 06/28/2017 1127   ALBUMIN 4.2 06/28/2017 1127   AST 23 06/28/2017 1127   ALT 33 06/28/2017 1127   ALKPHOS 60 06/28/2017 1127   BILITOT 0.5 06/28/2017 1127   GFRNONAA >60 02/06/2017 1025   GFRAA >60 02/06/2017 1025   Lab Results  Component Value Date   CHOL 201 (H) 06/28/2017   HDL 27.50 (L) 06/28/2017   LDLCALC 147 (H) 03/12/2016   LDLDIRECT 120.0 06/28/2017   TRIG (H) 06/28/2017    470.0 Triglyceride is over 400; calculations on Lipids are invalid.   CHOLHDL 7 06/28/2017   Lab Results  Component Value Date   HGBA1C 5.6 06/28/2017   No results found for: [id number] Lab Results  Component Value Date   TSH 2.25 03/12/2016      ASSESSMENT AND PLAN  47 y.o. year old male  has a past medical history of Allergic rhinitis, Allergy, Family history of adverse reaction to anesthesia, History of kidney stones, and Rosacea. here newly diagnosed with obstructive sleep apnea here for initial CPAP compliance.Data dated 09/24/2017-10/23/2017 shows compliance data greater than 4 hours at 97%.  Average usage 6 hours 8 minutes.  Set pressure 7-12 cm.  EPR level 3.  AHI 6.9.  ESS 4.The patient is a current patient of Dr. Brett Fairy  who is out of the office today . This note is sent to the work in doctor.     PLAN: CPAP compliance 97% Due to AHI of 6.9 we will increase max pressure slightly to 13  Follow-up in 4 months for repeat compliance [name], [facility], APRN  [facility] [address], [place], [place] [address] [phone]

## 2017-10-28 ENCOUNTER — Encounter: Payer: Self-pay | Admitting: Nurse Practitioner

## 2017-10-28 ENCOUNTER — Ambulatory Visit (INDEPENDENT_AMBULATORY_CARE_PROVIDER_SITE_OTHER): Payer: BLUE CROSS/BLUE SHIELD | Admitting: Nurse Practitioner

## 2017-10-28 DIAGNOSIS — Z9989 Dependence on other enabling machines and devices: Secondary | ICD-10-CM | POA: Diagnosis not present

## 2017-10-28 DIAGNOSIS — G4733 Obstructive sleep apnea (adult) (pediatric): Secondary | ICD-10-CM | POA: Diagnosis not present

## 2017-10-28 NOTE — Patient Instructions (Signed)
CPAP compliance 97% We will increase max pressure slightly to 13  Follow-up in 4 months

## 2017-10-28 NOTE — Progress Notes (Signed)
Fax confirmation received for cpap pressure change order [facility] [phone]. sy

## 2017-10-30 NOTE — Progress Notes (Signed)
I have reviewed and agreed above plan. 

## 2017-11-14 ENCOUNTER — Encounter: Payer: Self-pay | Admitting: Nurse Practitioner

## 2018-02-28 ENCOUNTER — Ambulatory Visit: Payer: BLUE CROSS/BLUE SHIELD | Admitting: Nurse Practitioner

## 2018-04-04 ENCOUNTER — Ambulatory Visit: Payer: BLUE CROSS/BLUE SHIELD | Admitting: Internal Medicine

## 2018-04-04 ENCOUNTER — Encounter: Payer: Self-pay | Admitting: Internal Medicine

## 2018-04-04 VITALS — BP 126/84 | HR 82 | Temp 98.4°F | Resp 16 | Ht 71.0 in | Wt 237.2 lb

## 2018-04-04 DIAGNOSIS — M5412 Radiculopathy, cervical region: Secondary | ICD-10-CM

## 2018-04-04 MED ORDER — CYCLOBENZAPRINE HCL 10 MG PO TABS: 10.0000 mg | ORAL_TABLET | Freq: Every evening | ORAL | 0 refills | Status: DC | PRN

## 2018-04-04 MED ORDER — PREDNISONE 10 MG PO TABS: ORAL_TABLET | ORAL | 0 refills | Status: DC

## 2018-04-04 NOTE — Patient Instructions (Signed)
Heating pad  Prednisone as prescribed  Flexeril, a muscle relaxant at night  You can alternate Tylenol and ibuprofen for pain.  Ibuprofen is not for long-term use, just for few days   Tylenol  500 mg OTC 2 tabs a day every 8 hours as needed for pain  IBUPROFEN (Advil or Motrin) 200 mg 2 tablets every 6 hours as needed for pain.  Always take it with food because may cause gastritis and ulcers.  If you notice nausea, stomach pain, change in the color of stools --->  Stop the medicine and let us know   Call if not gradually better within the next 2 weeks

## 2018-04-04 NOTE — Assessment & Plan Note (Signed)
Radiculopathy: Symptoms consistent with C6 radiculopathy, neurologic on exam is benign, recommend conservative treatment with heating pad, prednisone, Flexeril, Tylenol Motrin. He is to let me know if not improving in the next couple of weeks.

## 2018-04-04 NOTE — Progress Notes (Signed)
Pre visit review using our clinic review tool, if applicable. No additional management support is needed unless otherwise documented below in the visit note. 

## 2018-04-04 NOTE — Progress Notes (Signed)
Subjective:    Patient ID: Cameron Valenzuela, male    DOB: 1970-12-09, 47 y.o.   MRN: [id number]  DOS:  04/04/2018 Type of visit - description : acute Interval history: Symptoms started 6 weeks ago when he woke up with pain at the base of the left posterior neck. The pain is worse with certain position, sometimes radiates down to the shoulder or elbow.  Has also noticed some left thumb numbness and at some point the left thumb felt slightly weak. Has taken ibuprofen with no much relief.   Review of Systems  Denies any injury, fever or chills. No recent dental pain or dental work. No lower extremity paresthesias, no gait problems.  Past Medical History:  Diagnosis Date  . Allergic rhinitis   . Allergy    seasonal  . Family history of adverse reaction to anesthesia    mother slow to wake up   . History of kidney stones   . Rosacea     Past Surgical History:  Procedure Laterality Date  . Cystocopy     Dr. Karsten Ro  . CYSTOSCOPY WITH STENT PLACEMENT Left 02/18/2017   Procedure: CYSTOSCOPY/ LEFT RETROGRADE/ WITH LEFT STENT PLACEMENT;  Surgeon: Kathie Rhodes, MD;  Location: [facility];  Service: Urology;  Laterality: Left;  . EXTRACORPOREAL SHOCK WAVE LITHOTRIPSY Left 02/18/2017   Procedure: LEFT EXTRACORPOREAL SHOCK WAVE LITHOTRIPSY (ESWL);  Surgeon: Franchot Gallo, MD;  Location: [facility];  Service: Urology;  Laterality: Left;  . EXTRACORPOREAL SHOCK WAVE LITHOTRIPSY Left 04/11/2017   Procedure: LEFT EXTRACORPOREAL SHOCK WAVE LITHOTRIPSY (ESWL);  Surgeon: Irine Seal, MD;  Location: [facility];  Service: Urology;  Laterality: Left;  . LITHOTRIPSY     02/18/17  . neck bipsy     beningn, in his 35s    Social History   Socioeconomic History  . Marital status: Married    Spouse name: Vaughan Basta  . Number of children: 1  . Years of education: Not on file  . Highest education level: Not on file  Occupational History  . 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 Financial  resource strain: Not on file  . Food insecurity:    Worry: Not on file    Inability: Not on file  . Transportation needs:    Medical: Not on file    Non-medical: Not on file  Tobacco Use  . Smoking status: Never Smoker  . Smokeless tobacco: Never Used  Substance and Sexual Activity  . Alcohol use: Yes    Comment: rarely  . Drug use: No  . Sexual activity: Yes  Lifestyle  . Physical activity:    Days per week: Not on file    Minutes per session: Not on file  . Stress: Not on file  Relationships  . Social connections:    Talks on phone: Not on file    Gets together: Not on file    Attends religious service: Not on file    Active member of club or organization: Not on file    Attends meetings of clubs or organizations: Not on file    Relationship status: Not on file  . Intimate partner violence:    Fear of current or ex partner: Not on file    Emotionally abused: Not on file    Physically abused: Not on file    Forced sexual activity: Not on file  Other Topics Concern  . Not on file  Social History Narrative   Household-- pt, wife, child     (  mother in law [name], passed away Oct 21, 2015)      Allergies as of 04/04/2018   No Known Allergies     Medication List        Accurate as of 04/04/18  5:07 PM. Always use your most recent med list.          cyclobenzaprine 10 MG tablet Commonly known as:  FLEXERIL Take 1 tablet (10 mg total) by mouth at bedtime as needed for muscle spasms.   predniSONE 10 MG tablet Commonly known as:  DELTASONE 4 tablets x 2 days, 3 tabs x 2 days, 2 tabs x 2 days, 1 tab x 2 days          Objective:   Physical Exam BP 126/84 (BP Location: Left Arm, Patient Position: Sitting, Cuff Size: Normal)   Pulse 82   Temp 98.4 F (36.9 C) (Oral)   Resp 16   Ht 5\' 11"  (1.803 m)   Wt 237 lb 4 oz (107.6 kg)   SpO2 98%   BMI 33.09 kg/m  General:   Well developed, NAD, see BMI.  HEENT:  Normocephalic . Face symmetric,  atraumatic MSK: No TTP at the cervical spine.  Slightly TTP at the  left trapezoid area near the neck. Neck: Full range of motion but he does report pain when he turns his head to the left. Skin: Not pale. Not jaundice Neurologic:  alert & oriented X3.  Speech normal, gait appropriate for age and unassisted; DTRs and motor symmetric including motor function of the left hand. Mildly antalgic neck position Psych--  Cognition and judgment appear intact.  Cooperative with normal attention span and concentration.  Behavior appropriate. No anxious or depressed appearing.      Assessment & Plan:   Assessment Seasonal allergies Rosacea Kidney stones ([facility]) Snoring   PLAN: Radiculopathy: Symptoms consistent with C6 radiculopathy, neurologic on exam is benign, recommend conservative treatment with heating pad, prednisone, Flexeril, Tylenol Motrin. He is to let me know if not improving in the next couple of weeks.

## 2018-04-15 ENCOUNTER — Encounter: Payer: Self-pay | Admitting: Internal Medicine

## 2018-04-15 ENCOUNTER — Ambulatory Visit: Payer: BLUE CROSS/BLUE SHIELD | Admitting: Internal Medicine

## 2018-04-15 VITALS — BP 116/84 | HR 83 | Temp 98.4°F | Resp 16 | Ht 71.0 in | Wt 241.0 lb

## 2018-04-15 DIAGNOSIS — M5412 Radiculopathy, cervical region: Secondary | ICD-10-CM

## 2018-04-15 DIAGNOSIS — C4492 Squamous cell carcinoma of skin, unspecified: Secondary | ICD-10-CM | POA: Diagnosis not present

## 2018-04-15 MED ORDER — PREDNISONE 10 MG PO TABS: ORAL_TABLET | ORAL | 0 refills | Status: DC

## 2018-04-15 NOTE — Progress Notes (Signed)
Pre visit review using our clinic review tool, if applicable. No additional management support is needed unless otherwise documented below in the visit note. 

## 2018-04-15 NOTE — Patient Instructions (Signed)
Take the second round of prednisone

## 2018-04-15 NOTE — Progress Notes (Signed)
Subjective:    Patient ID: Cameron Valenzuela, male    DOB: 09/18/1970, 47 y.o.   MRN: [id number]  DOS:  04/15/2018 Type of visit - description : f/u, radiculopathy Interval history: Since the last office visit, he took prednisone, Flexeril, Tylenol and ibuprofen. Initially he felt he improved a little but now the pain has returned to almost the original levels.   Review of Systems  Continue with left upper extremity paresthesias, mostly on the thumb and second finger. Continue with pain at the base of the neck, left side, worse when he turns the head to the R . Denies any gait disorder or lower extremity paresthesias.   Past Medical History:  Diagnosis Date  . Allergic rhinitis   . Allergy    seasonal  . Family history of adverse reaction to anesthesia    mother slow to wake up   . History of kidney stones   . Rosacea     Past Surgical History:  Procedure Laterality Date  . Cystocopy     Dr. Karsten Ro  . CYSTOSCOPY WITH STENT PLACEMENT Left 02/18/2017   Procedure: CYSTOSCOPY/ LEFT RETROGRADE/ WITH LEFT STENT PLACEMENT;  Surgeon: Kathie Rhodes, MD;  Location: [facility];  Service: Urology;  Laterality: Left;  . EXTRACORPOREAL SHOCK WAVE LITHOTRIPSY Left 02/18/2017   Procedure: LEFT EXTRACORPOREAL SHOCK WAVE LITHOTRIPSY (ESWL);  Surgeon: Franchot Gallo, MD;  Location: [facility];  Service: Urology;  Laterality: Left;  . EXTRACORPOREAL SHOCK WAVE LITHOTRIPSY Left 04/11/2017   Procedure: LEFT EXTRACORPOREAL SHOCK WAVE LITHOTRIPSY (ESWL);  Surgeon: Irine Seal, MD;  Location: [facility];  Service: Urology;  Laterality: Left;  . LITHOTRIPSY     02/18/17  . neck bipsy     beningn, in his 36s    Social History   Socioeconomic History  . Marital status: Married    Spouse name: Vaughan Basta  . Number of children: 1  . Years of education: Not on file  . Highest education level: Not on file  Occupational History  . 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 Financial resource strain:  Not on file  . Food insecurity:    Worry: Not on file    Inability: Not on file  . Transportation needs:    Medical: Not on file    Non-medical: Not on file  Tobacco Use  . Smoking status: Never Smoker  . Smokeless tobacco: Never Used  Substance and Sexual Activity  . Alcohol use: Yes    Comment: rarely  . Drug use: No  . Sexual activity: Yes  Lifestyle  . Physical activity:    Days per week: Not on file    Minutes per session: Not on file  . Stress: Not on file  Relationships  . Social connections:    Talks on phone: Not on file    Gets together: Not on file    Attends religious service: Not on file    Active member of club or organization: Not on file    Attends meetings of clubs or organizations: Not on file    Relationship status: Not on file  . Intimate partner violence:    Fear of current or ex partner: Not on file    Emotionally abused: Not on file    Physically abused: Not on file    Forced sexual activity: Not on file  Other Topics Concern  . Not on file  Social History Narrative   Household-- pt, wife, child     (mother in  law [name], passed away 10/23/2015)      Allergies as of 04/15/2018   No Known Allergies     Medication List        Accurate as of 04/15/18 11:41 AM. Always use your most recent med list.          cyclobenzaprine 10 MG tablet Commonly known as:  FLEXERIL Take 1 tablet (10 mg total) by mouth at bedtime as needed for muscle spasms.   predniSONE 10 MG tablet Commonly known as:  DELTASONE 4 tablets x 2 days, 3 tabs x 2 days, 2 tabs x 2 days, 1 tab x 2 days          Objective:   Physical Exam BP 116/84 (BP Location: Left Arm, Patient Position: Sitting, Cuff Size: Normal)   Pulse 83   Temp 98.4 F (36.9 C) (Oral)   Resp 16   Ht 5\' 11"  (1.803 m)   Wt 241 lb (109.3 kg)   SpO2 98%   BMI 33.61 kg/m  General:   Well developed, NAD, see BMI.  HEENT:  Normocephalic . Face symmetric, atraumatic Neck: No TTP at the  cervical spine but pain is elicited when the pt turn his head to the right  Neurologic:  alert & oriented X3.  Speech normal, gait appropriate for age and unassisted. Motor and DTR symmetric. Psych--  Cognition and judgment appear intact.  Cooperative with normal attention span and concentration.  Behavior appropriate. No anxious or depressed appearing.      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 Recommend a second round of the steroids, starting with 50 mg q d x 2.  He tolerated first round of steroids okay. Flexeril does not seem to help much okay to hold it. Refer to orthopedic surgery. RTC scheduled for 05/2018

## 2018-04-16 NOTE — Assessment & Plan Note (Signed)
Radiculopathy, neck: Continue with symptoms consistent with C-spine radiculopathy, neurological exam remains intact. Recommend a second round of the steroids, starting with 50 mg q d x 2.  He tolerated first round of steroids okay. Flexeril does not seem to help much okay to hold it. Refer to orthopedic surgery. RTC scheduled for 05/2018

## 2018-04-30 ENCOUNTER — Telehealth (INDEPENDENT_AMBULATORY_CARE_PROVIDER_SITE_OTHER): Payer: Self-pay | Admitting: Specialist

## 2018-04-30 ENCOUNTER — Ambulatory Visit (INDEPENDENT_AMBULATORY_CARE_PROVIDER_SITE_OTHER): Payer: BLUE CROSS/BLUE SHIELD | Admitting: Specialist

## 2018-04-30 ENCOUNTER — Ambulatory Visit (INDEPENDENT_AMBULATORY_CARE_PROVIDER_SITE_OTHER): Payer: Self-pay

## 2018-04-30 ENCOUNTER — Encounter (INDEPENDENT_AMBULATORY_CARE_PROVIDER_SITE_OTHER): Payer: Self-pay | Admitting: Specialist

## 2018-04-30 VITALS — BP 119/76 | HR 74 | Ht 71.0 in | Wt 241.0 lb

## 2018-04-30 DIAGNOSIS — M5010 Cervical disc disorder with radiculopathy, unspecified cervical region: Secondary | ICD-10-CM

## 2018-04-30 DIAGNOSIS — R202 Paresthesia of skin: Secondary | ICD-10-CM | POA: Diagnosis not present

## 2018-04-30 DIAGNOSIS — R200 Anesthesia of skin: Secondary | ICD-10-CM | POA: Diagnosis not present

## 2018-04-30 DIAGNOSIS — M542 Cervicalgia: Secondary | ICD-10-CM

## 2018-04-30 MED ORDER — DICLOFENAC SODIUM 50 MG PO TBEC: DELAYED_RELEASE_TABLET | ORAL | 0 refills | Status: DC

## 2018-04-30 MED ORDER — DICLOFENAC SODIUM 50 MG PO TBEC: DELAYED_RELEASE_TABLET | ORAL | 0 refills | Status: AC

## 2018-04-30 NOTE — Patient Instructions (Signed)
Avoid overhead lifting and overhead use of the arms. Do not lift greater than 5-10 lbs. Adjust head rest in vehicle to prevent hyperextension if rear ended. Take extra precautions to avoid falling. Diclofenac 50 mg tab  Up to TID with meal or snack.  No other NSAIDs with this medication. Gabapentin 300mg  po qhs Flexeril 10 mg po up to TID. Physical therapy in [place] at [facility].

## 2018-04-30 NOTE — Addendum Note (Signed)
Addended by: Basil Dess on: 04/30/2018 10:17 AM   Modules accepted: Orders

## 2018-04-30 NOTE — Addendum Note (Signed)
Addended by: Basil Dess on: 04/30/2018 03:04 PM   Modules accepted: Orders

## 2018-04-30 NOTE — Telephone Encounter (Signed)
3 week f/u needed  Scheduled patient appt 06/09/18 2:15pm
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
## 2018-04-30 NOTE — Telephone Encounter (Signed)
[place] left VM  [phone]    Medication question  Diclofenac( Voltaren)50 mg EC tablet

## 2018-04-30 NOTE — Telephone Encounter (Signed)
I called correct order of 1 po with breakfast for 3 days, then 1 po BID for 3 days, then 1 po TID for 24 days, Qty 81, with 0 refills

## 2018-05-01 NOTE — Telephone Encounter (Signed)
I put him on the cancellation list 

## 2018-05-05 ENCOUNTER — Other Ambulatory Visit (INDEPENDENT_AMBULATORY_CARE_PROVIDER_SITE_OTHER): Payer: Self-pay | Admitting: Specialist

## 2018-05-05 MED ORDER — CYCLOBENZAPRINE HCL 10 MG PO TABS: 10.0000 mg | ORAL_TABLET | Freq: Every evening | ORAL | 0 refills | Status: DC | PRN

## 2018-05-05 MED ORDER — GABAPENTIN 300 MG PO CAPS: 300.0000 mg | ORAL_CAPSULE | Freq: Every day | ORAL | 2 refills | Status: DC

## 2018-05-05 NOTE — Telephone Encounter (Signed)
Patient left a message stating that his RX for Gabapentin and Flexeril were not at the Pharmacy.  CB#[id number].  Thank you.

## 2018-05-05 NOTE — Telephone Encounter (Signed)
Sent to Dr. Nitka ° ° °

## 2018-05-26 ENCOUNTER — Other Ambulatory Visit (INDEPENDENT_AMBULATORY_CARE_PROVIDER_SITE_OTHER): Payer: Self-pay | Admitting: Specialist

## 2018-05-26 NOTE — Telephone Encounter (Signed)
Diclofenac refill request 

## 2018-05-30 ENCOUNTER — Encounter: Payer: BLUE CROSS/BLUE SHIELD | Admitting: Internal Medicine

## 2018-06-06 ENCOUNTER — Ambulatory Visit (INDEPENDENT_AMBULATORY_CARE_PROVIDER_SITE_OTHER): Payer: BLUE CROSS/BLUE SHIELD | Admitting: Specialist

## 2018-06-06 ENCOUNTER — Telehealth (INDEPENDENT_AMBULATORY_CARE_PROVIDER_SITE_OTHER): Payer: Self-pay | Admitting: Specialist

## 2018-06-06 ENCOUNTER — Encounter (INDEPENDENT_AMBULATORY_CARE_PROVIDER_SITE_OTHER): Payer: Self-pay | Admitting: Specialist

## 2018-06-06 VITALS — BP 136/80 | HR 85 | Ht 71.0 in | Wt 241.0 lb

## 2018-06-06 DIAGNOSIS — M5412 Radiculopathy, cervical region: Secondary | ICD-10-CM | POA: Diagnosis not present
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
## 2018-06-06 NOTE — Telephone Encounter (Signed)
Scheduled patient appointment 07/18/17  @9   4 week f/u needed

## 2018-06-09 ENCOUNTER — Ambulatory Visit (INDEPENDENT_AMBULATORY_CARE_PROVIDER_SITE_OTHER): Payer: BLUE CROSS/BLUE SHIELD | Admitting: Specialist

## 2018-06-23 ENCOUNTER — Ambulatory Visit (INDEPENDENT_AMBULATORY_CARE_PROVIDER_SITE_OTHER): Payer: BLUE CROSS/BLUE SHIELD | Admitting: Internal Medicine

## 2018-06-23 ENCOUNTER — Encounter: Payer: Self-pay | Admitting: Internal Medicine

## 2018-06-23 VITALS — BP 134/78 | HR 83 | Temp 98.1°F | Resp 16 | Ht 71.0 in | Wt 259.4 lb

## 2018-06-23 DIAGNOSIS — Z23 Encounter for immunization: Secondary | ICD-10-CM | POA: Diagnosis not present

## 2018-06-23 DIAGNOSIS — Z0000 Encounter for general adult medical examination without abnormal findings: Secondary | ICD-10-CM

## 2018-06-23 NOTE — Assessment & Plan Note (Signed)
-  Td 2016; flu shot today -CCS:  Not indicated, no previous colonoscopy, no FH -Prostate cancer screening: Not indicated -Diet-exercise: Discussed extensively -EKG today nsr -Labs: Not fasting.  CMP, FLP, CBC, A1c, tsh

## 2018-06-23 NOTE — Patient Instructions (Signed)
GO TO THE LAB : Get the blood work     GO TO THE FRONT DESK Schedule your next appointment for a checkup in 6 months  Consider calorie counting  [facility])  [facility] provides seminars regards bariatric surgery [place],  [id number] [phone]   Start exercising (walking) once you lose 10 or 15 pounds

## 2018-06-23 NOTE — Progress Notes (Signed)
Subjective:    Patient ID: Cameron Valenzuela, male    DOB: 09-Jun-1971, 47 y.o.   MRN: [id number]  DOS:  06/23/2018 Type of visit - description : cpx In general feeling well, he is concerned about his weight. Was recently seen with neck pain, that has improved with conservative treatment.   Review of Systems  Other than above, a 14 point review of systems is negative    Past Medical History:  Diagnosis Date  . Allergic rhinitis   . Allergy    seasonal  . Family history of adverse reaction to anesthesia    mother slow to wake up   . History of kidney stones   . Rosacea   . SCC (squamous cell carcinoma) October 10, 2015   R face    Past Surgical History:  Procedure Laterality Date  . Cystocopy     Dr. Karsten Ro  . CYSTOSCOPY WITH STENT PLACEMENT Left 02/18/2017   Procedure: CYSTOSCOPY/ LEFT RETROGRADE/ WITH LEFT STENT PLACEMENT;  Surgeon: Kathie Rhodes, MD;  Location: [facility];  Service: Urology;  Laterality: Left;  . EXTRACORPOREAL SHOCK WAVE LITHOTRIPSY Left 02/18/2017   Procedure: LEFT EXTRACORPOREAL SHOCK WAVE LITHOTRIPSY (ESWL);  Surgeon: Franchot Gallo, MD;  Location: [facility];  Service: Urology;  Laterality: Left;  . EXTRACORPOREAL SHOCK WAVE LITHOTRIPSY Left 04/11/2017   Procedure: LEFT EXTRACORPOREAL SHOCK WAVE LITHOTRIPSY (ESWL);  Surgeon: Irine Seal, MD;  Location: [facility];  Service: Urology;  Laterality: Left;  . LITHOTRIPSY     02/18/17  . neck bipsy     beningn, in his 36s    Social History   Socioeconomic History  . Marital status: Married    Spouse name: Vaughan Basta  . Number of children: 1  . Years of education: Not on file  . Highest education level: Not on file  Occupational History  . 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 Financial resource strain: Not on file  . Food insecurity:    Worry: Not on file    Inability: Not on file  . Transportation needs:    Medical: Not on file    Non-medical: Not on file  Tobacco Use  . Smoking status: Never Smoker   . Smokeless tobacco: Never Used  Substance and Sexual Activity  . Alcohol use: Yes    Comment: rarely  . Drug use: No  . Sexual activity: Yes  Lifestyle  . Physical activity:    Days per week: Not on file    Minutes per session: Not on file  . Stress: Not on file  Relationships  . Social connections:    Talks on phone: Not on file    Gets together: Not on file    Attends religious service: Not on file    Active member of club or organization: Not on file    Attends meetings of clubs or organizations: Not on file    Relationship status: Not on file  . Intimate partner violence:    Fear of current or ex partner: Not on file    Emotionally abused: Not on file    Physically abused: Not on file    Forced sexual activity: Not on file  Other Topics Concern  . Not on file  Social History Narrative   Household-- pt, wife, child     (mother in law [name], passed away 10-10-15)   Family History  Problem Relation Age of Onset  . Diabetes Father   . COPD Father   . Heart disease  Father        ?  [name] Heart disease Maternal Grandfather   . Heart disease Paternal Grandmother   . Diabetes Other        many fam members   . Hyperlipidemia Mother        M and others  . Colon cancer Neg Hx   . Prostate cancer Neg Hx      Allergies as of 06/23/2018   No Known Allergies     Medication List        Accurate as of 06/23/18 11:59 PM. Always use your most recent med list.          diclofenac 50 MG EC tablet Commonly known as:  VOLTAREN 1TAB WITH BREAKFAST X 3 DAY,1 TAB 2X A DAY X 3 DAYS, 1 TAB 3X A DAY X 24 DAYS   gabapentin 300 MG capsule Commonly known as:  NEURONTIN Take 1 capsule (300 mg total) by mouth at bedtime.           Objective:   Physical Exam BP 134/78 (BP Location: Left Arm, Patient Position: Sitting, Cuff Size: Normal)   Pulse 83   Temp 98.1 F (36.7 C) (Oral)   Resp 16   Ht 5\' 11"  (1.803 m)   Wt 259 lb 6 oz (117.7 kg)   SpO2 98%   BMI 36.18  kg/m  General: Well developed, NAD, BMI noted Neck: No  thyromegaly  HEENT:  Normocephalic . Face symmetric, atraumatic Lungs:  CTA B Normal respiratory effort, no intercostal retractions, no accessory muscle use. Heart: RRR,  no murmur.  +/+++ pretibial edema bilaterally  Abdomen:  Not distended, soft, non-tender. No rebound or rigidity.   Skin: Exposed areas without rash. Not pale. Not jaundice Neurologic:  alert & oriented X3.  Speech normal, gait appropriate for age and unassisted Strength symmetric and appropriate for age.  Psych: Cognition and judgment appear intact.  Cooperative with normal attention span and concentration.  Behavior appropriate. No anxious or depressed appearing.     Assessment & Plan:   Assessment Seasonal allergies Rosacea Kidney stones ([facility]) OSA per sleep 2018 , has a CPAP  SCC dx 2019  PLAN: Radiculopathy: Saw neurosurgery, improving with conservative treatment, still has occasional numbness on the left thumb and index. Obesity: Has been obese for more than 20 years, weight varies from 220s to 250s. Admits to poor diet, not doing much exercise.  Options discussed: Start a healthier lifestyle on his own, bariatric surgery?,  A visit to the wellness clinic?   Information provided.  See AVS. RTC 6 months

## 2018-06-23 NOTE — Progress Notes (Signed)
Pre visit review using our clinic review tool, if applicable. No additional management support is needed unless otherwise documented below in the visit note. 

## 2018-06-24 ENCOUNTER — Other Ambulatory Visit (INDEPENDENT_AMBULATORY_CARE_PROVIDER_SITE_OTHER): Payer: BLUE CROSS/BLUE SHIELD

## 2018-06-24 DIAGNOSIS — Z0000 Encounter for general adult medical examination without abnormal findings: Secondary | ICD-10-CM

## 2018-06-24 LAB — COMPREHENSIVE METABOLIC PANEL
ALT: 33 U/L (ref 0–53)
AST: 22 U/L (ref 0–37)
Albumin: 4.4 g/dL (ref 3.5–5.2)
Alkaline Phosphatase: 68 U/L (ref 39–117)
BUN: 16 mg/dL (ref 6–23)
CO2: 29 mEq/L (ref 19–32)
Calcium: 8.8 mg/dL (ref 8.4–10.5)
Chloride: 103 mEq/L (ref 96–112)
Creatinine, Ser: 1.11 mg/dL (ref 0.40–1.50)
GFR: 74.94 mL/min (ref >60.00)
Glucose, Bld: 122 mg/dL — ABNORMAL HIGH (ref 70–99)
Potassium: 4.1 mEq/L (ref 3.5–5.1)
Sodium: 138 mEq/L (ref 135–145)
Total Bilirubin: 0.6 mg/dL (ref 0.2–1.2)
Total Protein: 6.5 g/dL (ref 6.0–8.3)

## 2018-06-24 LAB — LIPID PANEL
Cholesterol: 203 mg/dL — ABNORMAL HIGH (ref 0–200)
HDL: 26.7 mg/dL — ABNORMAL LOW (ref >39.00)
NonHDL: 176.33
Total CHOL/HDL Ratio: 8
Triglycerides: 333 mg/dL — ABNORMAL HIGH (ref 0.0–149.0)
VLDL: 66.6 mg/dL — ABNORMAL HIGH (ref 0.0–40.0)

## 2018-06-24 LAB — CBC WITH DIFFERENTIAL/PLATELET
Basophils Absolute: 0 10*3/uL (ref 0.0–0.1)
Basophils Relative: 0.4 % (ref 0.0–3.0)
Eosinophils Absolute: 0.1 10*3/uL (ref 0.0–0.7)
Eosinophils Relative: 1.9 % (ref 0.0–5.0)
HCT: 43.7 % (ref 39.0–52.0)
Hemoglobin: 14.9 g/dL (ref 13.0–17.0)
Lymphocytes Relative: 23.5 % (ref 12.0–46.0)
Lymphs Abs: 1.3 10*3/uL (ref 0.7–4.0)
MCHC: 34 g/dL (ref 30.0–36.0)
MCV: 91.8 fl (ref 78.0–100.0)
Monocytes Absolute: 0.4 10*3/uL (ref 0.1–1.0)
Monocytes Relative: 7.4 % (ref 3.0–12.0)
Neutro Abs: 3.7 10*3/uL (ref 1.4–7.7)
Neutrophils Relative %: 66.8 % (ref 43.0–77.0)
Platelets: 176 10*3/uL (ref 150.0–400.0)
RBC: 4.76 Mil/uL (ref 4.22–5.81)
RDW: 15.2 % (ref 11.5–15.5)
WBC: 5.6 10*3/uL (ref 4.0–10.5)

## 2018-06-24 LAB — TSH: TSH: 3.15 u[IU]/mL (ref 0.35–4.50)

## 2018-06-24 LAB — HEMOGLOBIN A1C: Hgb A1c MFr Bld: 5.5 % (ref 4.6–6.5)

## 2018-06-24 LAB — LDL CHOLESTEROL, DIRECT: Direct LDL: 116 mg/dL

## 2018-06-24 NOTE — Assessment & Plan Note (Signed)
Radiculopathy: Saw neurosurgery, improving with conservative treatment, still has occasional numbness on the left thumb and index. Obesity: Has been obese for more than 20 years, weight varies from 220s to 250s. Admits to poor diet, not doing much exercise.  Options discussed: Start a healthier lifestyle on his own, bariatric surgery?,  A visit to the wellness clinic?   Information provided.  See AVS. RTC 6 months

## 2018-06-26 ENCOUNTER — Other Ambulatory Visit (INDEPENDENT_AMBULATORY_CARE_PROVIDER_SITE_OTHER): Payer: Self-pay | Admitting: Specialist

## 2018-06-26 NOTE — Telephone Encounter (Signed)
Diclofenac refill request 

## 2018-07-18 ENCOUNTER — Ambulatory Visit (INDEPENDENT_AMBULATORY_CARE_PROVIDER_SITE_OTHER): Payer: BLUE CROSS/BLUE SHIELD | Admitting: Specialist

## 2018-07-23 ENCOUNTER — Encounter: Payer: Self-pay | Admitting: Neurology

## 2018-07-28 ENCOUNTER — Encounter: Payer: Self-pay | Admitting: Neurology

## 2018-07-28 ENCOUNTER — Ambulatory Visit (INDEPENDENT_AMBULATORY_CARE_PROVIDER_SITE_OTHER): Payer: BLUE CROSS/BLUE SHIELD | Admitting: Neurology

## 2018-07-28 VITALS — BP 122/76 | HR 79 | Ht 70.0 in | Wt 259.0 lb

## 2018-07-28 DIAGNOSIS — R0683 Snoring: Secondary | ICD-10-CM | POA: Diagnosis not present

## 2018-07-28 DIAGNOSIS — F54 Psychological and behavioral factors associated with disorders or diseases classified elsewhere: Secondary | ICD-10-CM

## 2018-07-28 DIAGNOSIS — G4734 Idiopathic sleep related nonobstructive alveolar hypoventilation: Secondary | ICD-10-CM

## 2018-07-28 DIAGNOSIS — Z9989 Dependence on other enabling machines and devices: Secondary | ICD-10-CM

## 2018-07-28 DIAGNOSIS — G4733 Obstructive sleep apnea (adult) (pediatric): Secondary | ICD-10-CM

## 2018-07-28 DIAGNOSIS — E669 Obesity, unspecified: Secondary | ICD-10-CM

## 2018-07-28 NOTE — Progress Notes (Signed)
[facility]  PATIENT: Cameron Valenzuela DOB: July 14, 1971   REASON FOR VISIT: Follow-up for newly diagnosed obstructive sleep apnea with initial CPAP HISTORY FROM: Patient   HISTORY OF PRESENT ILLNESS:  CD?Interval history from 28 July 2018.  I have the pleasure of meeting Cameron Valenzuela today a 48 year old established CPAP patient in our practice who is followed for his primary care needs by Dr. Belinda Fisher, MD.  He was last seen in April 2019 with the first compliance data gathered from his auto titration device after he underwent a sleep study and was diagnosed with sleep apnea.  His current AutoSet pressures between 7 and 13 cmH2O, with an expiratory pressure relief of 3 cmH2O.  He has a high snoring index while using CPAP which is unusual.  His apnea index is 2.7, his hypopnea index 3.6 but he has an RDI of 2.5 on top of these 2 data.  The 95th percentile pressure is 13 cmH2O as prescribed, the residual AHI total is 6.3 and a little bit too high.       UPDATE 4/15/2019[name], 48 year old male returns for follow-up with newly diagnosed obstructive sleep apnea here for initial CPAP compliance.  He denies difficulty adjusting to the machine.  Compliance data dated 09/24/2017-10/23/2017 shows compliance data greater than 4 hours at 97%.  Average usage 6 hours 8 minutes.  Set pressure 7-12 cm.  EPR level 3.  AHI 6.9.  ESS 4. FSS 36.  He returns for reevaluation. PLAN: CPAP compliance 97% Due to AHI of 6.9 we will increase max pressure slightly to 13 cm  Follow-up in 4 months for repeat compliance Cameron Valenzuela, GNP, [facility], APRN    11/5/18[name] is a 48 y.o. male , seen here as in a referral from Dr. Larose Kells for snoring and witnessed apnea following a medical procedure under anesthesia.  Chief complaint according to patient : "I am not excessively tired or fatigued during the day, but the reported apnea is worrisome"  Cameron Valenzuela is a Caucasian  48 year old married gentleman who presents following 2 lithotripsies under anesthesia- after each apnea had been noticed by medical staff. The first procedure took place in May,  the second in August 2018. The patient also reports that his body mass index and weight has fluctuated vastly, currently being at a higher end.  Last summer he lost about 35 pounds over a period of only 3 months while working out of state.  This came back with an additional weight gain, now at 240 pounds. Other diagnosis were listed below.   Sleep habits are as follows: The patient usually watches television during the last hour before he retreats to bed.  Bedtime is around 11 PM and he has no trouble to fall asleep.  He does not have a preferred sleep position, sleeps on 2 pillows, in the bedroom that is described as cool, quiet and dark.  He shares a bedroom with his wife. He does recall his dreams occasionally, dreams are not described as threatening or nightmarish in character.  He may have one nocturia break at night but not more. He wakes up spontaneously at about 5:30 AM, and he rises feeling usually refreshed and restored. He wakes with a dry mouth, rarely with headaches in season for his allergic rhinitis.     REVIEW OF SYSTEMS: Full 14 system review of systems performed and notable only for those listed, all others are neg:  Gained further weight Sleep : Obstructive sleep  apnea with CPAP.  FSS at 21/ 63 points  Epworth Sleepiness Scale at 6/ 24 points.    ALLERGIES: No Known Allergies  HOME MEDICATIONS: Outpatient Medications Prior to Visit  Medication Sig Dispense Refill  . diclofenac (VOLTAREN) 50 MG EC tablet Take 1 tablet (50 mg total) by mouth 2 (two) times daily with a meal. 60 tablet 0  . gabapentin (NEURONTIN) 300 MG capsule Take 1 capsule (300 mg total) by mouth at bedtime. (Patient not taking: Reported on 06/23/2018) 30 capsule 2   No facility-administered medications prior to visit.      PAST MEDICAL HISTORY: Past Medical History:  Diagnosis Date  . Allergic rhinitis   . Allergy    seasonal  . Family history of adverse reaction to anesthesia    mother slow to wake up   . History of kidney stones   . Rosacea   . SCC (squamous cell carcinoma) 2017   R face    PAST SURGICAL HISTORY: Past Surgical History:  Procedure Laterality Date  . Cystocopy     Dr. Karsten Ro  . CYSTOSCOPY WITH STENT PLACEMENT Left 02/18/2017   Procedure: CYSTOSCOPY/ LEFT RETROGRADE/ WITH LEFT STENT PLACEMENT;  Surgeon: Kathie Rhodes, MD;  Location: [facility];  Service: Urology;  Laterality: Left;  . EXTRACORPOREAL SHOCK WAVE LITHOTRIPSY Left 02/18/2017   Procedure: LEFT EXTRACORPOREAL SHOCK WAVE LITHOTRIPSY (ESWL);  Surgeon: Franchot Gallo, MD;  Location: [facility];  Service: Urology;  Laterality: Left;  . EXTRACORPOREAL SHOCK WAVE LITHOTRIPSY Left 04/11/2017   Procedure: LEFT EXTRACORPOREAL SHOCK WAVE LITHOTRIPSY (ESWL);  Surgeon: Irine Seal, MD;  Location: [facility];  Service: Urology;  Laterality: Left;  . LITHOTRIPSY     02/18/17  . neck bipsy     beningn, in his 42s    FAMILY HISTORY: Family History  Problem Relation Age of Onset  . Diabetes Father   . COPD Father   . Heart disease Father        ?  [name] Heart disease Maternal Grandfather   . Heart disease Paternal Grandmother   . Diabetes Other        many fam members   . Hyperlipidemia Mother        M and others  . Colon cancer Neg Hx   . Prostate cancer Neg Hx     SOCIAL HISTORY: Social History   Socioeconomic History  . Marital status: Married    Spouse name: Cameron Valenzuela  . Number of children: 1  . Years of education: Not on file  . Highest education level: Not on file  Occupational History  . 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 Financial resource strain: Not on file  . Food insecurity:    Worry: Not on file    Inability: Not on file  . Transportation needs:    Medical: Not on file    Non-medical: Not on  file  Tobacco Use  . Smoking status: Never Smoker  . Smokeless tobacco: Never Used  Substance and Sexual Activity  . Alcohol use: Yes    Comment: rarely  . Drug use: No  . Sexual activity: Yes  Lifestyle  . Physical activity:    Days per week: Not on file    Minutes per session: Not on file  . Stress: Not on file  Relationships  . Social connections:    Talks on phone: Not on file    Gets together: Not on file    Attends religious service: Not on file  Active member of club or organization: Not on file    Attends meetings of clubs or organizations: Not on file    Relationship status: Not on file  . Intimate partner violence:    Fear of current or ex partner: Not on file    Emotionally abused: Not on file    Physically abused: Not on file    Forced sexual activity: Not on file  Other Topics Concern  . Not on file  Social History Narrative   Household-- pt, wife, child     (mother in law Worthington, passed away 13-Oct-2015)     PHYSICAL EXAM  Vitals:   07/28/18 1546  BP: 122/76  Pulse: 79  Weight: 259 lb (117.5 kg)  Height: 5\' 10"  (1.778 m)   Body mass index is 37.16 kg/m.  Generalized: Well developed, obese male in no acute distress  Head: normocephalic and atraumatic,. Oropharynx benign mallopatti 4-5 Neck: Supple, neck circumference 19 Musculoskeletal: No deformity   Neurological examination   Mentation: Alert oriented to time, place, history taking. Attention span and concentration appropriate. Recent and remote memory intact.  Follows all commands speech and language fluent. ESS 4  Cranial nerve :  Taste and smell are intact. Pupils were equal round reactive to light extraocular movements were full,. Facial sensation and strength were normal. hearing was intact to finger rubbing bilaterally. Uvula tongue midline. head turning and shoulder shrug were normal and symmetric.Tongue protrusion into cheek strength was normal. Motor: normal bulk and tone, C 6  impingement.  Sensory: normal and symmetric to light touch,  Coordination: finger-nose bilaterally, no dysmetria, no tremor.  Gait and Station: Rising up from seated position without assistance, normal stance,  moderate stride, good arm swing, smooth turning.   DIAGNOSTIC DATA (LABS, IMAGING, TESTING)  download CPAP data .  - I reviewed patient records, labs, notes, testing and imaging myself where available.  Lab Results  Component Value Date   WBC 5.6 06/24/2018   HGB 14.9 06/24/2018   HCT 43.7 06/24/2018   MCV 91.8 06/24/2018   PLT 176.0 06/24/2018      Component Value Date/Time   NA 138 06/24/2018 0809   K 4.1 06/24/2018 0809   CL 103 06/24/2018 0809   CO2 29 06/24/2018 0809   GLUCOSE 122 (H) 06/24/2018 0809   BUN 16 06/24/2018 0809   CREATININE 1.11 06/24/2018 0809   CALCIUM 8.8 06/24/2018 0809   PROT 6.5 06/24/2018 0809   ALBUMIN 4.4 06/24/2018 0809   AST 22 06/24/2018 0809   ALT 33 06/24/2018 0809   ALKPHOS 68 06/24/2018 0809   BILITOT 0.6 06/24/2018 0809   GFRNONAA >60 02/06/2017 1025   GFRAA >60 02/06/2017 1025   Lab Results  Component Value Date   CHOL 203 (H) 06/24/2018   HDL 26.70 (L) 06/24/2018   LDLCALC 147 (H) 03/12/2016   LDLDIRECT 116.0 06/24/2018   TRIG 333.0 (H) 06/24/2018   CHOLHDL 8 06/24/2018   Lab Results  Component Value Date   HGBA1C 5.5 06/24/2018   No results found for: [id number] Lab Results  Component Value Date   TSH 3.15 06/24/2018      ASSESSMENT AND PLAN  48 y.o. year old male  has a past medical history of Allergic rhinitis, OSA on CPAP, Obesity .   1) There are no central apneas emerging so I think we are safe to further increase the pressure unless the patient cannot tolerated. He hs no air leaks- has facial hair.  I will increase  CPAP upper Pressure to 15 cm water, 2 cm EPR. No aerophagia reported.   2)amb referral for medical weight management with Dr. Leafy Ro, [facility] [address] [place], [place] [address] [phone]

## 2018-08-13 ENCOUNTER — Telehealth (INDEPENDENT_AMBULATORY_CARE_PROVIDER_SITE_OTHER): Payer: Self-pay | Admitting: Specialist

## 2018-08-13 NOTE — Telephone Encounter (Signed)
Appt cancelled through Auto dialer

## 2018-08-15 ENCOUNTER — Ambulatory Visit (INDEPENDENT_AMBULATORY_CARE_PROVIDER_SITE_OTHER): Payer: BLUE CROSS/BLUE SHIELD | Admitting: Specialist

## 2018-08-22 ENCOUNTER — Ambulatory Visit: Payer: BLUE CROSS/BLUE SHIELD | Admitting: Nurse Practitioner

## 2018-09-04 ENCOUNTER — Encounter (INDEPENDENT_AMBULATORY_CARE_PROVIDER_SITE_OTHER): Payer: BLUE CROSS/BLUE SHIELD

## 2018-09-05 ENCOUNTER — Encounter (INDEPENDENT_AMBULATORY_CARE_PROVIDER_SITE_OTHER): Payer: Self-pay | Admitting: Specialist

## 2018-09-05 ENCOUNTER — Ambulatory Visit (INDEPENDENT_AMBULATORY_CARE_PROVIDER_SITE_OTHER): Payer: BLUE CROSS/BLUE SHIELD | Admitting: Specialist

## 2018-09-05 VITALS — BP 126/81 | HR 79 | Ht 70.0 in | Wt 258.0 lb

## 2018-09-05 DIAGNOSIS — M5020 Other cervical disc displacement, unspecified cervical region: Secondary | ICD-10-CM | POA: Diagnosis not present

## 2018-09-05 NOTE — Patient Instructions (Signed)
Plan: Avoid overhead lifting and overhead use of the arms. Do not lift greater than 5 lbs. Adjust head rest in vehicle to prevent hyperextension if rear ended. Take extra precautions to avoid falling. 

## 2018-09-05 NOTE — Progress Notes (Signed)
Office Visit Note   Patient: Cameron Valenzuela           Date of Birth: 25-Oct-1970           MRN: [id number] Visit Date: 09/05/2018              Requested by: Colon Branch, [address] [place], [place] [address] PCP: Colon Branch, MD   Assessment & Plan: Visit Diagnoses:  1. Herniation of cervical intervertebral disc     Plan:Avoid overhead lifting and overhead use of the arms. Do not lift greater than 5 lbs. Adjust head rest in vehicle to prevent hyperextension if rear ended. Take extra precautions to avoid falling.      Follow-Up Instructions: No follow-ups on file.   Orders:  No orders of the defined types were placed in this encounter.  No orders of the defined types were placed in this encounter.     Procedures: No procedures performed   Clinical Data: No additional findings.   Subjective: Chief Complaint  Patient presents with  . Neck - Follow-up    48 year old male with a history of cervicalgia and left upper extremity radicular pain and numbness, MRI with small HNP he has done well with conservative treatment ESI and PT with [employer] at his work place. He has been discharged at [employer] [facility].   Review of Systems  Constitutional: Negative.   HENT: Negative.   Eyes: Negative.   Respiratory: Negative.   Cardiovascular: Negative.   Gastrointestinal: Negative.   Endocrine: Negative.   Genitourinary: Negative.   Musculoskeletal: Negative.   Skin: Negative.   Allergic/Immunologic: Negative.   Neurological: Negative.   Hematological: Negative.   Psychiatric/Behavioral: Negative.      Objective: Vital Signs: BP 126/81 (BP Location: Right Arm)   Pulse 79   Ht 5\' 10"  (1.778 m)   Wt 258 lb (117 kg)   BMI 37.02 kg/m   Physical Exam Constitutional:      Appearance: He is well-developed.  HENT:     Head: Normocephalic and atraumatic.  Eyes:     Pupils: Pupils are equal, round, and reactive to light.  Neck:   Musculoskeletal: Normal range of motion and neck supple.  Pulmonary:     Effort: Pulmonary effort is normal.     Breath sounds: Normal breath sounds.  Abdominal:     General: Bowel sounds are normal.     Palpations: Abdomen is soft.  Skin:    General: Skin is warm and dry.  Neurological:     Mental Status: He is alert and oriented to person, place, and time.  Psychiatric:        Behavior: Behavior normal.        Thought Content: Thought content normal.        Judgment: Judgment normal.     Back Exam   Tenderness  The patient is experiencing tenderness in the cervical.  Range of Motion  Extension: abnormal  Flexion: abnormal  Lateral bend right: normal  Lateral bend left: normal  Rotation right: normal  Rotation left: normal   Muscle Strength  Right Quadriceps:  5/5  Left Quadriceps:  5/5  Right Hamstrings:  5/5  Left Hamstrings:  5/5   Tests  Straight leg raise right: negative Straight leg raise left: negative  Reflexes  Patellar: 1/4 Achilles: 1/4 Babinski's sign: normal   Other  Toe walk: normal Heel walk: normal Sensation: normal Gait: normal  Erythema: no back  redness Scars: absent      Specialty Comments:  No specialty comments available.  Imaging: No results found.   PMFS History: Patient Active Problem List   Diagnosis Date Noted  . Obstructive sleep apnea treated with continuous positive airway pressure (CPAP) 10/28/2017  . PCP NOTES >>>>>>>>>>>>>>>> 03/12/2016  . SCC (squamous cell carcinoma) 07/17/2015  . Annual physical exam 11/20/2012   Past Medical History:  Diagnosis Date  . Allergic rhinitis   . Allergy    seasonal  . Family history of adverse reaction to anesthesia    mother slow to wake up   . History of kidney stones   . Rosacea   . SCC (squamous cell carcinoma) 2017   R face    Family History  Problem Relation Age of Onset  . Diabetes Father   . COPD Father   . Heart disease Father        ?  [name] Heart disease  Maternal Grandfather   . Heart disease Paternal Grandmother   . Diabetes Other        many fam members   . Hyperlipidemia Mother        M and others  . Colon cancer Neg Hx   . Prostate cancer Neg Hx     Past Surgical History:  Procedure Laterality Date  . Cystocopy     Dr. Karsten Ro  . CYSTOSCOPY WITH STENT PLACEMENT Left 02/18/2017   Procedure: CYSTOSCOPY/ LEFT RETROGRADE/ WITH LEFT STENT PLACEMENT;  Surgeon: Kathie Rhodes, MD;  Location: [facility];  Service: Urology;  Laterality: Left;  . EXTRACORPOREAL SHOCK WAVE LITHOTRIPSY Left 02/18/2017   Procedure: LEFT EXTRACORPOREAL SHOCK WAVE LITHOTRIPSY (ESWL);  Surgeon: Franchot Gallo, MD;  Location: [facility];  Service: Urology;  Laterality: Left;  . EXTRACORPOREAL SHOCK WAVE LITHOTRIPSY Left 04/11/2017   Procedure: LEFT EXTRACORPOREAL SHOCK WAVE LITHOTRIPSY (ESWL);  Surgeon: Irine Seal, MD;  Location: [facility];  Service: Urology;  Laterality: Left;  . LITHOTRIPSY     02/18/17  . neck bipsy     beningn, in his 55s   Social History   Occupational History  . 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 Smoking status: Never Smoker  . Smokeless tobacco: Never Used  Substance and Sexual Activity  . Alcohol use: Yes    Comment: rarely  . Drug use: No  . Sexual activity: Yes

## 2018-09-08 ENCOUNTER — Encounter (INDEPENDENT_AMBULATORY_CARE_PROVIDER_SITE_OTHER): Payer: BLUE CROSS/BLUE SHIELD

## 2018-09-18 ENCOUNTER — Ambulatory Visit (INDEPENDENT_AMBULATORY_CARE_PROVIDER_SITE_OTHER): Payer: BLUE CROSS/BLUE SHIELD | Admitting: Family Medicine

## 2018-09-18 ENCOUNTER — Encounter (INDEPENDENT_AMBULATORY_CARE_PROVIDER_SITE_OTHER): Payer: Self-pay | Admitting: Family Medicine

## 2018-09-18 VITALS — BP 130/81 | HR 76 | Ht 70.0 in | Wt 256.0 lb

## 2018-09-18 DIAGNOSIS — R0602 Shortness of breath: Secondary | ICD-10-CM

## 2018-09-18 DIAGNOSIS — Z6836 Body mass index (BMI) 36.0-36.9, adult: Secondary | ICD-10-CM

## 2018-09-18 DIAGNOSIS — Z9189 Other specified personal risk factors, not elsewhere classified: Secondary | ICD-10-CM

## 2018-09-18 DIAGNOSIS — E782 Mixed hyperlipidemia: Secondary | ICD-10-CM | POA: Diagnosis not present

## 2018-09-18 DIAGNOSIS — Z1331 Encounter for screening for depression: Secondary | ICD-10-CM

## 2018-09-18 DIAGNOSIS — G4733 Obstructive sleep apnea (adult) (pediatric): Secondary | ICD-10-CM | POA: Diagnosis not present

## 2018-09-18 DIAGNOSIS — R5383 Other fatigue: Secondary | ICD-10-CM | POA: Diagnosis not present

## 2018-09-18 DIAGNOSIS — Z0289 Encounter for other administrative examinations: Secondary | ICD-10-CM

## 2018-09-18 DIAGNOSIS — R739 Hyperglycemia, unspecified: Secondary | ICD-10-CM

## 2018-09-18 NOTE — Progress Notes (Signed)
Office: [phone]  /  Fax: [phone]   Dear Dr. Brett Fairy,   Thank you for referring Cameron Valenzuela to our clinic. The following note includes my evaluation and treatment recommendations.  HPI:   Chief Complaint: OBESITY    [name] has been referred by [name], MD for consultation regarding his obesity and obesity related comorbidities.    Cameron Valenzuela (MR# [id number]) is a 48 y.o. male who presents on 09/18/2018 for obesity evaluation and treatment. Current BMI is Body mass index is 36.73 kg/m.Cameron Valenzuela has been struggling with his weight for many years and has been unsuccessful in either losing weight, maintaining weight loss, or reaching his healthy weight goal.     Cameron Valenzuela attended our information session and states he is currently in the action stage of change and ready to dedicate time achieving and maintaining a healthier weight. Cameron Valenzuela is interested in becoming our patient and working on intensive lifestyle modifications including (but not limited to) diet, exercise and weight loss.    Cameron Valenzuela states his family eats meals together he thinks his family will eat healthier with him he struggles with possible family and/or coworkers weight loss sabotage but not on purpose his desired weight loss is 56 lbs he started gaining weight in 2001 his heaviest weight ever was 260 lbs. he is a picky eater and doesn't like to eat healthier foods  he has significant food cravings issues  he snacks frequently in the evenings he skips breakfast or lunch once or twice per week  he is frequently drinking liquids with calories he frequently makes poor food choices he frequently eats larger portions than normal  he struggles with emotional eating    Fatigue Cameron Valenzuela feels his energy is lower than it should be. This has worsened with weight gain and has worsened recently. Cameron Valenzuela admits to daytime somnolence and denies waking up still tired. Patient is at risk for  obstructive sleep apnea. Patent has a history of symptoms of daytime fatigue. Patient generally gets 6 hours or less of sleep per night, and states they generally have restful sleep with CPAP. Snoring is present. Apneic episodes are present. Epworth Sleepiness Score is 9.  Dyspnea on exertion Cameron Valenzuela notes increasing shortness of breath with exercising and seems to be worsening over time with weight gain. He notes getting out of breath sooner with activity than he used to. This has gotten worse recently. [name] denies orthopnea.  Sleep Apnea Cameron Valenzuela reports wearing CPAP most nights and feels better when he does. He states he is ready to work on weight loss to treat this.  Hyperlipidemia (Mixed) Cameron Valenzuela has hyperlipidemia and is currently not on a statin. His triglycerides are especially elevated. He states he has a positive family history of elevated triglycerides and diabetes mellitus. He has been trying to improve his cholesterol levels with intensive lifestyle modification including a low saturated fat diet, exercise and weight loss.   Hyperglycemia [name] has multiple elevated fasting glucose readings. He reports positive polyphagia.  At risk for diabetes Cameron Valenzuela is at higher than averagerisk for developing diabetes due to his obesity. He currently denies polyuria or polydipsia.  Depression Screen [name]'s Food and Mood (modified PHQ-9) score was 13.  Depression screen PHQ 2/9 09/18/2018  Decreased Interest 1  Down, Depressed, Hopeless 1  PHQ - 2 Score 2  Altered sleeping 3  Tired, decreased energy 3  Change in appetite 1  Feeling bad or failure about yourself  1  Trouble concentrating 0  Moving slowly or fidgety/restless 3  Suicidal thoughts 0  PHQ-9 Score 13  Difficult doing work/chores Somewhat difficult   ASSESSMENT AND PLAN:  Other fatigue - Plan: EKG 12-Lead, Vitamin B12, CBC With Differential, Folate, T3, T4, free, TSH, VITAMIN D 25 Hydroxy (Vit-D Deficiency,  Fractures)  Shortness of breath on exertion - Plan: Lipid Panel With LDL/HDL Ratio  Obstructive sleep apnea syndrome  Mixed hyperlipidemia  Hyperglycemia - Plan: Comprehensive metabolic panel, Hemoglobin A1c, Insulin, random  Depression screening  At risk for diabetes mellitus  Class 2 severe obesity with serious comorbidity and body mass index (BMI) of 36.0 to 36.9 in adult, unspecified obesity type (HCC)  PLAN:  Fatigue Cameron Valenzuela was informed that his fatigue may be related to obesity, depression or many other causes. Labs will be ordered, and in the meanwhile Branch has agreed to work on diet, exercise and weight loss to help with fatigue. Proper sleep hygiene was discussed including the need for 7-8 hours of quality sleep each night. A sleep study was not ordered based on symptoms and Epworth score.  Dyspnea on exertion Cameron Valenzuela's shortness of breath appears to be obesity related and exercise induced. He has agreed to work on weight loss and gradually increase exercise to treat his exercise induced shortness of breath. If Cameron Valenzuela follows our instructions and loses weight without improvement of his shortness of breath, we will plan to refer to pulmonology. We will monitor this condition regularly. [name] agrees to this plan.  Obstructive Sleep Apnea Cameron Valenzuela will start diet and exercise and was instructed to continue using his CPAP.  Hyperlipidemia (Mixed) Cameron Valenzuela was informed of the American Heart Association Guidelines emphasizing intensive lifestyle modifications as the first line treatment for hyperlipidemia. We discussed many lifestyle modifications today in depth, and Cameron Valenzuela will continue to work on decreasing saturated fats such as fatty red meat, butter and many fried foods. He will also increase vegetables and lean protein in his diet and continue to work on exercise and weight loss efforts. He will have labs drawn today.  Hyperglycemia Fasting labs will be obtained and  results with be discussed with Cameron Valenzuela in 2 weeks at his follow up visit. In the meanwhile Cameron Valenzuela will have labs checked today, start diet, and will work on weight loss efforts.  Diabetes risk counseling Cameron Valenzuela was given extended (15 minutes) diabetes prevention counseling today. He is 48 y.o. male and has risk factors for diabetes including obesity. We discussed intensive lifestyle modifications today with an emphasis on weight loss as well as increasing exercise and decreasing simple carbohydrates in his diet.  Depression Screen Dontario had a moderately positive depression screening. Depression is commonly associated with obesity and often results in emotional eating behaviors. We will monitor this closely and work on CBT to help improve the non-hunger eating patterns. Referral to Psychology may be required if no improvement is seen as he continues in our clinic.  Obesity Tamon is currently in the action stage of change and his goal is to continue with weight loss efforts. I recommend Xzavien begin the structured treatment plan as follows:  He has agreed to follow the category 4 plan  Aharon has been instructed to eventually work up to a goal of 150 minutes of combined cardio and strengthening exercise per week for weight loss and overall health benefits. We discussed the following Behavioral Modification Strategies today: increasing lean protein intake, decreasing simple carbohydrates, decrease eating out, work on meal planning and easy cooking plans, and dealing with family or coworker  sabotage.   He was informed of the importance of frequent follow-up visits to maximize his success with intensive lifestyle modifications for his multiple health conditions. He was informed we would discuss his lab results at his next visit unless there is a critical issue that needs to be addressed sooner. Commodore agreed to keep his next visit at the agreed upon time to discuss these  results.  ALLERGIES: No Known Allergies  MEDICATIONS: No current outpatient medications on file prior to visit.   No current facility-administered medications on file prior to visit.     PAST MEDICAL HISTORY: Past Medical History:  Diagnosis Date  . Allergic rhinitis   . Allergy    seasonal  . Family history of adverse reaction to anesthesia    mother slow to wake up   . Fatigue   . History of kidney stones   . Kidney stones   . Knee pain, right   . Lower extremity edema   . Pinched nerve in neck   . Rosacea   . 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 Sleep apnea     PAST SURGICAL HISTORY: Past Surgical History:  Procedure Laterality Date  . Cystocopy     Dr. Karsten Ro  . CYSTOSCOPY WITH STENT PLACEMENT Left 02/18/2017   Procedure: CYSTOSCOPY/ LEFT RETROGRADE/ WITH LEFT STENT PLACEMENT;  Surgeon: Kathie Rhodes, MD;  Location: [facility];  Service: Urology;  Laterality: Left;  . EXTRACORPOREAL SHOCK WAVE LITHOTRIPSY Left 02/18/2017   Procedure: LEFT EXTRACORPOREAL SHOCK WAVE LITHOTRIPSY (ESWL);  Surgeon: Franchot Gallo, MD;  Location: [facility];  Service: Urology;  Laterality: Left;  . EXTRACORPOREAL SHOCK WAVE LITHOTRIPSY Left 04/11/2017   Procedure: LEFT EXTRACORPOREAL SHOCK WAVE LITHOTRIPSY (ESWL);  Surgeon: Irine Seal, MD;  Location: [facility];  Service: Urology;  Laterality: Left;  . LITHOTRIPSY     02/18/17  . neck bipsy     beningn, in his 68s    SOCIAL HISTORY: Social History   Tobacco Use  . Smoking status: Never Smoker  . Smokeless tobacco: Never Used  Substance Use Topics  . Alcohol use: Yes    Comment: rarely  . Drug use: No    FAMILY HISTORY: Family History  Problem Relation Age of Onset  . Diabetes Father   . COPD Father   . Heart disease Father        ?  . Stroke Father   . Sleep apnea Father   . Obesity Father   . Heart disease Maternal Grandfather   . Heart disease Paternal Grandmother   . Diabetes Other        many fam members   .  Hyperlipidemia Mother        M and others  . Cancer Mother   . Colon cancer Neg Hx   . Prostate cancer Neg Hx    ROS: Review of Systems  Constitutional: Positive for malaise/fatigue.  Eyes:       Positive for vision changes.  Respiratory: Positive for shortness of breath (with activity).   Genitourinary:       Negative for polyuria.  Endo/Heme/Allergies: Negative for polydipsia.       Positive for polyphagia.   PHYSICAL EXAM: Blood pressure 130/81, pulse 76, height 5\' 10"  (1.778 m), weight 256 lb (116.1 kg), SpO2 100 %. Body mass index is 36.73 kg/m. Physical Exam Vitals signs reviewed.  Constitutional:      Appearance: Normal appearance. He is well-developed. He is obese.  HENT:  Head: Normocephalic and atraumatic.     Nose: Nose normal.  Eyes:     General: No scleral icterus. Neck:     Musculoskeletal: Normal range of motion.  Cardiovascular:     Rate and Rhythm: Normal rate and regular rhythm.  Pulmonary:     Effort: Pulmonary effort is normal. No respiratory distress.  Abdominal:     Palpations: Abdomen is soft.     Tenderness: There is no abdominal tenderness.  Musculoskeletal: Normal range of motion.     Comments: Range of motion normal in all four extremities.  Skin:    General: Skin is warm and dry.  Neurological:     Mental Status: He is alert and oriented to person, place, and time.     Coordination: Coordination normal.  Psychiatric:        Mood and Affect: Mood and affect normal.        Behavior: Behavior normal.   RECENT LABS AND TESTS: BMET    Component Value Date/Time   NA 138 06/24/2018 0809   K 4.1 06/24/2018 0809   CL 103 06/24/2018 0809   CO2 29 06/24/2018 0809   GLUCOSE 122 (H) 06/24/2018 0809   BUN 16 06/24/2018 0809   CREATININE 1.11 06/24/2018 0809   CALCIUM 8.8 06/24/2018 0809   GFRNONAA >60 02/06/2017 1025   GFRAA >60 02/06/2017 1025   Lab Results  Component Value Date   HGBA1C 5.5 06/24/2018   No results found for:  INSULIN CBC    Component Value Date/Time   WBC 5.6 06/24/2018 0809   RBC 4.76 06/24/2018 0809   HGB 14.9 06/24/2018 0809   HCT 43.7 06/24/2018 0809   PLT 176.0 06/24/2018 0809   MCV 91.8 06/24/2018 0809   MCH 31.0 02/06/2017 1025   MCHC 34.0 06/24/2018 0809   RDW 15.2 06/24/2018 0809   LYMPHSABS 1.3 06/24/2018 0809   MONOABS 0.4 06/24/2018 0809   EOSABS 0.1 06/24/2018 0809   BASOSABS 0.0 06/24/2018 0809   Iron/TIBC/Ferritin/ %Sat No results found for: IRON, TIBC, FERRITIN, IRONPCTSAT Lipid Panel     Component Value Date/Time   CHOL 203 (H) 06/24/2018 0809   TRIG 333.0 (H) 06/24/2018 0809   HDL 26.70 (L) 06/24/2018 0809   CHOLHDL 8 06/24/2018 0809   VLDL 66.6 (H) 06/24/2018 0809   LDLCALC 147 (H) 03/12/2016 1332   LDLDIRECT 116.0 06/24/2018 0809   Hepatic Function Panel     Component Value Date/Time   PROT 6.5 06/24/2018 0809   ALBUMIN 4.4 06/24/2018 0809   AST 22 06/24/2018 0809   ALT 33 06/24/2018 0809   ALKPHOS 68 06/24/2018 0809   BILITOT 0.6 06/24/2018 0809      Component Value Date/Time   TSH 3.15 06/24/2018 0809   TSH 2.25 03/12/2016 1332   TSH 3.70 02/14/2015 1420   No Results For: Vitamin D  ECG shows sinus rhythm with a rate of 86 BPM.  INDIRECT CALORIMETER done today shows a VO2 of 390 and a REE of 2717.  His calculated basal metabolic rate is 4854 thus his basal metabolic rate is better than expected.  OBESITY BEHAVIORAL INTERVENTION VISIT  Today's visit was #1  Starting weight: 256 lbs Starting date: 09/18/2018 Today's weight: 256 lbs Today's date: 09/18/2018 Total lbs lost to date: 0    09/18/2018  Height 5\' 10"  (1.778 m)  Weight 256 lb (116.1 kg)  BMI (Calculated) 36.73  BLOOD PRESSURE - SYSTOLIC 627  BLOOD PRESSURE - DIASTOLIC 81  Waist Measurement  49  inches   Body Fat % 30.7 %  Total Body Water (lbs) 132.2 lbs  RMR 2717   ASK: We discussed the diagnosis of obesity with [facility] today and Rafeal agreed to give [place]  permission to discuss obesity behavioral modification therapy today.  ASSESS: Eddie has the diagnosis of obesity and his BMI today is 36.73. Greysyn is in the action stage of change.   ADVISE: Zachry was educated on the multiple health risks of obesity as well as the benefit of weight loss to improve his health. He was advised of the need for long term treatment and the importance of lifestyle modifications to improve his current health and to decrease his risk of future health problems.  AGREE: Multiple dietary modification options and treatment options were discussed and  Jas agreed to follow the recommendations documented in the above note.  ARRANGE: Massai was educated on the importance of frequent visits to treat obesity as outlined per CMS and USPSTF guidelines and agreed to schedule his next follow up appointment today.  I, Michaelene Song, am acting as Location manager for Dennard Nip, MD   I have reviewed the above documentation for accuracy and completeness, and I agree with the above. -Dennard Nip, MD

## 2018-09-19 LAB — CBC WITH DIFFERENTIAL
Basophils Absolute: 0 10*3/uL (ref 0.0–0.2)
Basos: 1 %
EOS (ABSOLUTE): 0.1 10*3/uL (ref 0.0–0.4)
Eos: 2 %
Hematocrit: 41.5 % (ref 37.5–51.0)
Hemoglobin: 15.1 g/dL (ref 13.0–17.7)
Immature Grans (Abs): 0 10*3/uL (ref 0.0–0.1)
Immature Granulocytes: 0 %
Lymphocytes Absolute: 1.6 10*3/uL (ref 0.7–3.1)
Lymphs: 30 %
MCH: 31.4 pg (ref 26.6–33.0)
MCHC: 36.4 g/dL — ABNORMAL HIGH (ref 31.5–35.7)
MCV: 86 fL (ref 79–97)
Monocytes Absolute: 0.4 10*3/uL (ref 0.1–0.9)
Monocytes: 8 %
Neutrophils Absolute: 3.2 10*3/uL (ref 1.4–7.0)
Neutrophils: 59 %
RBC: 4.81 x10E6/uL (ref 4.14–5.80)
RDW: 13.5 % (ref 11.6–15.4)
WBC: 5.4 10*3/uL (ref 3.4–10.8)

## 2018-09-19 LAB — LIPID PANEL WITH LDL/HDL RATIO
Cholesterol, Total: 214 mg/dL — ABNORMAL HIGH (ref 100–199)
HDL: 25 mg/dL — ABNORMAL LOW (ref >39)
LDL Calculated: 114 mg/dL — ABNORMAL HIGH (ref 0–99)
LDl/HDL Ratio: 4.6 ratio — ABNORMAL HIGH (ref 0.0–3.6)
Triglycerides: 375 mg/dL — ABNORMAL HIGH (ref 0–149)
VLDL Cholesterol Cal: 75 mg/dL — ABNORMAL HIGH (ref 5–40)

## 2018-09-19 LAB — COMPREHENSIVE METABOLIC PANEL
ALT: 47 IU/L — ABNORMAL HIGH (ref 0–44)
AST: 33 IU/L (ref 0–40)
Albumin/Globulin Ratio: 2.1 (ref 1.2–2.2)
Albumin: 4.5 g/dL (ref 4.0–5.0)
Alkaline Phosphatase: 80 IU/L (ref 39–117)
BUN/Creatinine Ratio: 16 (ref 9–20)
BUN: 16 mg/dL (ref 6–24)
Bilirubin Total: 0.4 mg/dL (ref 0.0–1.2)
CO2: 22 mmol/L (ref 20–29)
Calcium: 9.5 mg/dL (ref 8.7–10.2)
Chloride: 103 mmol/L (ref 96–106)
Creatinine, Ser: 1.03 mg/dL (ref 0.76–1.27)
GFR calc Af Amer: 99 mL/min/{1.73_m2} (ref >59)
GFR calc non Af Amer: 85 mL/min/{1.73_m2} (ref >59)
Globulin, Total: 2.1 g/dL (ref 1.5–4.5)
Glucose: 118 mg/dL — ABNORMAL HIGH (ref 65–99)
Potassium: 4.5 mmol/L (ref 3.5–5.2)
Sodium: 142 mmol/L (ref 134–144)
Total Protein: 6.6 g/dL (ref 6.0–8.5)

## 2018-09-19 LAB — T3: T3, Total: 120 ng/dL (ref 71–180)

## 2018-09-19 LAB — VITAMIN B12: Vitamin B-12: 570 pg/mL (ref 232–1245)

## 2018-09-19 LAB — TSH: TSH: 3.96 u[IU]/mL (ref 0.450–4.500)

## 2018-09-19 LAB — HEMOGLOBIN A1C
Est. average glucose Bld gHb Est-mCnc: 131 mg/dL
Hgb A1c MFr Bld: 6.2 % — ABNORMAL HIGH (ref 4.8–5.6)

## 2018-09-19 LAB — FOLATE: Folate: 11.3 ng/mL (ref >3.0)

## 2018-09-19 LAB — T4, FREE: Free T4: 0.96 ng/dL (ref 0.82–1.77)

## 2018-09-19 LAB — INSULIN, RANDOM: INSULIN: 60.2 u[IU]/mL — ABNORMAL HIGH (ref 2.6–24.9)

## 2018-09-19 LAB — VITAMIN D 25 HYDROXY (VIT D DEFICIENCY, FRACTURES): Vit D, 25-Hydroxy: 13.3 ng/mL — ABNORMAL LOW (ref 30.0–100.0)

## 2018-10-06 ENCOUNTER — Ambulatory Visit (INDEPENDENT_AMBULATORY_CARE_PROVIDER_SITE_OTHER): Payer: BLUE CROSS/BLUE SHIELD | Admitting: Family Medicine

## 2018-10-06 ENCOUNTER — Encounter (INDEPENDENT_AMBULATORY_CARE_PROVIDER_SITE_OTHER): Payer: Self-pay | Admitting: Family Medicine

## 2018-10-06 ENCOUNTER — Other Ambulatory Visit: Payer: Self-pay

## 2018-10-06 VITALS — BP 117/72 | HR 66 | Ht 70.0 in | Wt 240.0 lb

## 2018-10-06 DIAGNOSIS — E782 Mixed hyperlipidemia: Secondary | ICD-10-CM | POA: Diagnosis not present

## 2018-10-06 DIAGNOSIS — E669 Obesity, unspecified: Secondary | ICD-10-CM

## 2018-10-06 DIAGNOSIS — E559 Vitamin D deficiency, unspecified: Secondary | ICD-10-CM

## 2018-10-06 DIAGNOSIS — R945 Abnormal results of liver function studies: Secondary | ICD-10-CM | POA: Diagnosis not present

## 2018-10-06 DIAGNOSIS — Z6834 Body mass index (BMI) 34.0-34.9, adult: Secondary | ICD-10-CM

## 2018-10-06 DIAGNOSIS — R7989 Other specified abnormal findings of blood chemistry: Secondary | ICD-10-CM

## 2018-10-06 DIAGNOSIS — Z9189 Other specified personal risk factors, not elsewhere classified: Secondary | ICD-10-CM

## 2018-10-06 DIAGNOSIS — R7303 Prediabetes: Secondary | ICD-10-CM

## 2018-10-06 MED ORDER — METFORMIN HCL 500 MG PO TABS: 500.0000 mg | ORAL_TABLET | Freq: Every day | ORAL | 0 refills | Status: DC

## 2018-10-06 MED ORDER — VITAMIN D (ERGOCALCIFEROL) 1.25 MG (50000 UNIT) PO CAPS: 50000.0000 [IU] | ORAL_CAPSULE | ORAL | 0 refills | Status: DC

## 2018-10-06 NOTE — Progress Notes (Signed)
Office: [phone]  /  Fax: [phone]   HPI:   Chief Complaint: OBESITY Cameron Valenzuela is here to discuss his progress with his obesity treatment plan. He is on the Category 4 plan and is following his eating plan approximately 85 % of the time. He states he is walking 1 mile for 15 to 30 minutes 3 to 4 times per week. Jamille did very well with weight loss on his Category 4 plan. His hunger is improved, but he still struggled with emotional eating.  His weight is 240 lb (108.9 kg) today and has had a weight loss of 16 pounds over a period of 2 weeks since his last visit. He has lost 16 lbs since starting treatment with [place].  Vitamin D Deficiency Cameron Valenzuela has a new diagnosis of vitamin D deficiency. He is not currently on vit D and admits fatigue.  Pre-Diabetes [name] has a new diagnosis of pre-diabetes based on his elevated Hgb A1c of 6.2 on 09/18/18, which is elevated from 5.5 in December. He was informed this puts him at greater risk of developing diabetes. He is not taking metformin currently and continues to work on diet and exercise to decrease risk of diabetes. He admits polyphagia.   At risk for diabetes Cameron Valenzuela is at higher than average risk for developing diabetes due to his pre-diabetes and obesity. He currently denies polyuria or polydipsia.  Hyperlipidemia (Mixed) [name] has hyperlipidemia with elevated triglycerides of 375, elevated LDL of 114, and decreased HDL of 25. He is not on a statin or fibrate. He has been trying to improve his cholesterol levels with intensive lifestyle modification including a low saturated fat diet, exercise, and weight loss. He denies any chest pain.  Elevated ALT [name] has an isolated mildly elevated ALT of 47 on 09/18/18. He denies abdominal pain or jaundice.  ASSESSMENT AND PLAN:  Vitamin D deficiency - Plan: Vitamin D, Ergocalciferol, (DRISDOL) 1.25 MG (50000 UT) CAPS capsule  Prediabetes - Plan: metFORMIN (GLUCOPHAGE) 500 MG tablet  Mixed  hyperlipidemia  Elevated LFTs  At risk for diabetes mellitus  Class 1 obesity with serious comorbidity and body mass index (BMI) of 34.0 to 34.9 in adult, unspecified obesity type  PLAN:  Vitamin D Deficiency Cameron Valenzuela was informed that low vitamin D levels contribute to fatigue and are associated with obesity, breast, and colon cancer. Cameron Valenzuela agrees to start to take prescription Vit D @50 ,000 IU every week #4 with no refills and will follow up for routine testing of vitamin D, at least 2-3 times per year. He was informed of the risk of over-replacement of vitamin D and agrees to not increase his dose unless he discusses this with [place] first. [name] agrees to follow up in 2 weeks as directed.  Pre-Diabetes Cameron Valenzuela will continue to work on weight loss, exercise, and decreasing simple carbohydrates in his diet to help decrease the risk of diabetes. He was informed that eating too many simple carbohydrates or too many calories at one sitting increases the likelihood of GI side effects. Cameron Valenzuela agreed to start metformin 500 mg qAM #30 with no refills and a prescription was written today. [name] agreed to follow up with [place] as directed to monitor his progress in 2 weeks.  Diabetes risk counseling Cameron Valenzuela was given extended (30 minutes) diabetes prevention counseling today. He is 48 y.o. male and has risk factors for diabetes including pre-diabetes and obesity. We discussed intensive lifestyle modifications today with an emphasis on weight loss as well as increasing exercise and decreasing  simple carbohydrates in his diet.  Hyperlipidemia (Mixed) Cameron Valenzuela was informed of the American Heart Association Guidelines emphasizing intensive lifestyle modifications as the first line treatment for hyperlipidemia. We discussed many lifestyle modifications today in depth, and Cameron Valenzuela will continue to work on decreasing saturated fats such as fatty red meat, butter and many fried foods. He will also increase  vegetables and lean protein in his diet and continue to work on exercise and weight loss efforts. We will recheck labs in 3 months. Cameron Valenzuela agrees to continue his diet and he will follow up in 2 weeks.  Elevated ALT Cameron Valenzuela agrees to continue with his diet and weight loss. We will recheck labs in 3 months and he agrees to follow up at the agreed upon time.  Obesity Cameron Valenzuela is currently in the action stage of change. As such, his goal is to continue with weight loss efforts. He has agreed to follow the Category 4 plan. [name] has been instructed to work up to a goal of 150 minutes of combined cardio and strengthening exercise per week for weight loss and overall health benefits. We discussed the following Behavioral Modification Strategies today: increasing lean protein intake, decreasing simple carbohydrates, work on meal planning and easy cooking plans, emotional eating strategies, ways to avoid boredom eating, keeping healthy foods in the home, better snacking choices, and ways to avoid night time snacking.  Cameron Valenzuela has agreed to follow up with our clinic in 2 weeks via telehealth. He was informed of the importance of frequent follow up visits to maximize his success with intensive lifestyle modifications for his multiple health conditions.  ALLERGIES: No Known Allergies  MEDICATIONS: No current outpatient medications on file prior to visit.   No current facility-administered medications on file prior to visit.     PAST MEDICAL HISTORY: Past Medical History:  Diagnosis Date  . Allergic rhinitis   . Allergy    seasonal  . Family history of adverse reaction to anesthesia    mother slow to wake up   . Fatigue   . History of kidney stones   . Kidney stones   . Knee pain, right   . Lower extremity edema   . Pinched nerve in neck   . Rosacea   . 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 Sleep apnea     PAST SURGICAL HISTORY: Past Surgical History:  Procedure Laterality  Date  . Cystocopy     Dr. Karsten Ro  . CYSTOSCOPY WITH STENT PLACEMENT Left 02/18/2017   Procedure: CYSTOSCOPY/ LEFT RETROGRADE/ WITH LEFT STENT PLACEMENT;  Surgeon: Kathie Rhodes, MD;  Location: [facility];  Service: Urology;  Laterality: Left;  . EXTRACORPOREAL SHOCK WAVE LITHOTRIPSY Left 02/18/2017   Procedure: LEFT EXTRACORPOREAL SHOCK WAVE LITHOTRIPSY (ESWL);  Surgeon: Franchot Gallo, MD;  Location: [facility];  Service: Urology;  Laterality: Left;  . EXTRACORPOREAL SHOCK WAVE LITHOTRIPSY Left 04/11/2017   Procedure: LEFT EXTRACORPOREAL SHOCK WAVE LITHOTRIPSY (ESWL);  Surgeon: Irine Seal, MD;  Location: [facility];  Service: Urology;  Laterality: Left;  . LITHOTRIPSY     02/18/17  . neck bipsy     beningn, in his 33s    SOCIAL HISTORY: Social History   Tobacco Use  . Smoking status: Never Smoker  . Smokeless tobacco: Never Used  Substance Use Topics  . Alcohol use: Yes    Comment: rarely  . Drug use: No    FAMILY HISTORY: Family History  Problem Relation Age of Onset  . Diabetes Father   .  COPD Father   . Heart disease Father        ?  . Stroke Father   . Sleep apnea Father   . Obesity Father   . Heart disease Maternal Grandfather   . Heart disease Paternal Grandmother   . Diabetes Other        many fam members   . Hyperlipidemia Mother        M and others  . Cancer Mother   . Colon cancer Neg Hx   . Prostate cancer Neg Hx    ROS: Review of Systems  Constitutional: Positive for malaise/fatigue and weight loss.  Cardiovascular: Negative for chest pain.  Gastrointestinal: Negative for abdominal pain.       Negative for jaundice.  Genitourinary:       Negative for polyuria.  Endo/Heme/Allergies: Negative for polydipsia.       Positive for polyphagia.   PHYSICAL EXAM: Blood pressure 117/72, pulse 66, height 5\' 10"  (1.778 m), weight 240 lb (108.9 kg), SpO2 98 %. Body mass index is 34.44 kg/m. Physical Exam Vitals signs reviewed.  Constitutional:      Appearance:  Normal appearance. He is obese.  Cardiovascular:     Rate and Rhythm: Normal rate.  Pulmonary:     Effort: Pulmonary effort is normal.  Musculoskeletal: Normal range of motion.  Skin:    General: Skin is warm and dry.  Neurological:     Mental Status: He is alert and oriented to person, place, and time.  Psychiatric:        Mood and Affect: Mood normal.        Behavior: Behavior normal.    RECENT LABS AND TESTS: BMET    Component Value Date/Time   NA 142 09/18/2018 0855   K 4.5 09/18/2018 0855   CL 103 09/18/2018 0855   CO2 22 09/18/2018 0855   GLUCOSE 118 (H) 09/18/2018 0855   GLUCOSE 122 (H) 06/24/2018 0809   BUN 16 09/18/2018 0855   CREATININE 1.03 09/18/2018 0855   CALCIUM 9.5 09/18/2018 0855   GFRNONAA 85 09/18/2018 0855   GFRAA 99 09/18/2018 0855   Lab Results  Component Value Date   HGBA1C 6.2 (H) 09/18/2018   HGBA1C 5.5 06/24/2018   HGBA1C 5.6 06/28/2017   Lab Results  Component Value Date   INSULIN 60.2 (H) 09/18/2018   CBC    Component Value Date/Time   WBC 5.4 09/18/2018 0855   WBC 5.6 06/24/2018 0809   RBC 4.81 09/18/2018 0855   RBC 4.76 06/24/2018 0809   HGB 15.1 09/18/2018 0855   HCT 41.5 09/18/2018 0855   PLT 176.0 06/24/2018 0809   MCV 86 09/18/2018 0855   MCH 31.4 09/18/2018 0855   MCH 31.0 02/06/2017 1025   MCHC 36.4 (H) 09/18/2018 0855   MCHC 34.0 06/24/2018 0809   RDW 13.5 09/18/2018 0855   LYMPHSABS 1.6 09/18/2018 0855   MONOABS 0.4 06/24/2018 0809   EOSABS 0.1 09/18/2018 0855   BASOSABS 0.0 09/18/2018 0855   Iron/TIBC/Ferritin/ %Sat No results found for: IRON, TIBC, FERRITIN, IRONPCTSAT Lipid Panel     Component Value Date/Time   CHOL 214 (H) 09/18/2018 0855   TRIG 375 (H) 09/18/2018 0855   HDL 25 (L) 09/18/2018 0855   CHOLHDL 8 06/24/2018 0809   VLDL 66.6 (H) 06/24/2018 0809   LDLCALC 114 (H) 09/18/2018 0855   LDLDIRECT 116.0 06/24/2018 0809   Hepatic Function Panel     Component Value Date/Time   PROT 6.6 09/18/2018  0855   ALBUMIN 4.5 09/18/2018 0855   AST 33 09/18/2018 0855   ALT 47 (H) 09/18/2018 0855   ALKPHOS 80 09/18/2018 0855   BILITOT 0.4 09/18/2018 0855      Component Value Date/Time   TSH 3.960 09/18/2018 0855   TSH 3.15 06/24/2018 0809   TSH 2.25 03/12/2016 1332   Results for RADLEY, BARTO (MRN [id number]) as of 10/06/2018 09:51  Ref. Range 09/18/2018 08:55  Vitamin D, 25-Hydroxy Latest Ref Range: 30.0 - 100.0 ng/mL 13.3 (L)   OBESITY BEHAVIORAL INTERVENTION VISIT  Today's visit was # 2  Starting weight: 256 lbs Starting date: 09/18/18 Today's weight : Weight: 240 lb (108.9 kg)  Today's date: 10/06/2018 Total lbs lost to date: 16    10/06/2018  Height 5\' 10"  (1.778 m)  Weight 240 lb (108.9 kg)  BMI (Calculated) 34.44  BLOOD PRESSURE - SYSTOLIC 037  BLOOD PRESSURE - DIASTOLIC 72   Body Fat % 54.3 %  Total Body Water (lbs) 116.6 lbs   ASK: We discussed the diagnosis of obesity with [place] today and Lucky agreed to give [place] permission to discuss obesity behavioral modification therapy today.  ASSESS: Loyal has the diagnosis of obesity and his BMI today is 34.44. Lavonta is in the action stage of change.   ADVISE: Seferino was educated on the multiple health risks of obesity as well as the benefit of weight loss to improve his health. He was advised of the need for long term treatment and the importance of lifestyle modifications to improve his current health and to decrease his risk of future health problems.  AGREE: Multiple dietary modification options and treatment options were discussed and Daylan agreed to follow the recommendations documented in the above note.  ARRANGE: Dailey was educated on the importance of frequent visits to treat obesity as outlined per CMS and USPSTF guidelines and agreed to schedule his next follow up appointment today.  IMarcille Blanco, CMA, am acting as transcriptionist for Starlyn Skeans, MD  I have reviewed the above  documentation for accuracy and completeness, and I agree with the above. -Dennard Nip, MD

## 2018-10-07 ENCOUNTER — Encounter (INDEPENDENT_AMBULATORY_CARE_PROVIDER_SITE_OTHER): Payer: Self-pay

## 2018-10-08 ENCOUNTER — Encounter: Payer: Self-pay | Admitting: *Deleted

## 2018-10-14 ENCOUNTER — Encounter (INDEPENDENT_AMBULATORY_CARE_PROVIDER_SITE_OTHER): Payer: Self-pay

## 2018-10-20 ENCOUNTER — Encounter (INDEPENDENT_AMBULATORY_CARE_PROVIDER_SITE_OTHER): Payer: Self-pay | Admitting: Family Medicine

## 2018-10-20 ENCOUNTER — Ambulatory Visit (INDEPENDENT_AMBULATORY_CARE_PROVIDER_SITE_OTHER): Payer: BLUE CROSS/BLUE SHIELD | Admitting: Family Medicine

## 2018-10-20 ENCOUNTER — Other Ambulatory Visit: Payer: Self-pay

## 2018-10-20 DIAGNOSIS — E559 Vitamin D deficiency, unspecified: Secondary | ICD-10-CM

## 2018-10-20 DIAGNOSIS — Z6834 Body mass index (BMI) 34.0-34.9, adult: Secondary | ICD-10-CM

## 2018-10-20 DIAGNOSIS — R7303 Prediabetes: Secondary | ICD-10-CM

## 2018-10-20 DIAGNOSIS — E669 Obesity, unspecified: Secondary | ICD-10-CM

## 2018-10-20 MED ORDER — VITAMIN D (ERGOCALCIFEROL) 1.25 MG (50000 UNIT) PO CAPS: 50000.0000 [IU] | ORAL_CAPSULE | ORAL | 0 refills | Status: DC

## 2018-10-20 MED ORDER — METFORMIN HCL 500 MG PO TABS: 500.0000 mg | ORAL_TABLET | Freq: Two times a day (BID) | ORAL | 0 refills | Status: DC

## 2018-10-20 NOTE — Progress Notes (Signed)
Office: [phone]  /  Fax: [phone] TeleHealth Visit:  Cameron Valenzuela has verbally consented to this TeleHealth visit today. The patient is located at home, the provider is located at the [facility] office. The participants in this visit include the listed provider, patient, and his spouse Vaughan Basta. The visit was conducted today via Webex.  HPI:   Chief Complaint: OBESITY Cameron Valenzuela is here to discuss his progress with his obesity treatment plan. He is on the Category 4 plan and is following his eating plan approximately 85 % of the time. He states he is walking 15 to 20 minutes 5 times per week. Davieon states that he is doing well on his Category 4 plan. 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 He notes decreased hunger in the daytime, but this is increased after dinner.  We were unable to weigh the patient today for this TeleHealth visit. He feels as if he has lost weight since his last visit. He has lost 16 lbs since starting treatment with [place].  Pre-Diabetes Cameron Valenzuela has a diagnosis of pre-diabetes based on his elevated Hgb A1c and was informed this puts him at greater risk of developing diabetes. He started taking metformin and his last A1c was elevated at 6.2 on 09/18/18. Jeramey notes decreased polyphagia in the daytime, but this increased by dinnertime and later. He continues to work on diet and exercise to decrease risk of diabetes.   Vitamin D Deficiency Cameron Valenzuela has a diagnosis of vitamin D deficiency. He is currently stable on vit D, but is not yet at goal. Cameron Valenzuela denies nausea, vomiting, or muscle weakness.  ASSESSMENT AND PLAN:  Prediabetes - Plan: metFORMIN (GLUCOPHAGE) 500 MG tablet  Vitamin D deficiency - Plan: Vitamin D, Ergocalciferol, (DRISDOL) 1.25 MG (50000 UT) CAPS capsule  Class 1 obesity with serious comorbidity and body mass index (BMI) of 34.0 to 34.9 in adult, unspecified  obesity type  PLAN:  Pre-Diabetes Cameron Valenzuela will continue to work on weight loss, exercise, and decreasing simple carbohydrates in his diet to help decrease the risk of diabetes. He was informed that eating too many simple carbohydrates or too many calories at one sitting increases the likelihood of GI side effects. Clary agreed to increase metformin 500 mg to BID #60 with no refills and a prescription was not written today. Cameron Valenzuela agreed to follow up with [place] as directed to monitor his progress in 3 weeks.  Vitamin D Deficiency Cameron Valenzuela was informed that low vitamin D levels contribute to fatigue and are associated with obesity, breast, and colon cancer. Cameron Valenzuela agrees to continue to take prescription Vit D @50 ,000 IU every week #4 with no refills and will follow up for routine testing of vitamin D, at least 2-3 times per year. He was informed of the risk of over-replacement of vitamin D and agrees to not increase his dose unless he discusses this with [place] first. Cameron Valenzuela agrees to follow up in 3 weeks as directed.  Obesity Cameron Valenzuela is currently in the action stage of change. As such, his goal is to continue with weight loss efforts. He has agreed to follow the Category 4 plan. Cameron Valenzuela has been instructed to work up to a goal of 150 minutes of combined cardio and strengthening exercise per week for weight loss and overall health benefits. We discussed the following Behavioral Modification Strategies today: increasing lean protein intake, decreasing simple carbohydrates, work on meal planning and  easy cooking plans, ways to avoid boredom eating, better snacking choices, and ways to avoid night time snacking.  Cameron Valenzuela has agreed to follow up with our clinic in 3 weeks. He was informed of the importance of frequent follow up visits to maximize his success with intensive lifestyle modifications for his multiple health conditions.  ALLERGIES: No Known Allergies  MEDICATIONS: No current outpatient  medications on file prior to visit.   No current facility-administered medications on file prior to visit.     PAST MEDICAL HISTORY: Past Medical History:  Diagnosis Date  . Allergic rhinitis   . Allergy    seasonal  . Family history of adverse reaction to anesthesia    mother slow to wake up   . Fatigue   . History of kidney stones   . Kidney stones   . Knee pain, right   . Lower extremity edema   . Pinched nerve in neck   . Rosacea   . 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 Sleep apnea     PAST SURGICAL HISTORY: Past Surgical History:  Procedure Laterality Date  . Cystocopy     Dr. Karsten Ro  . CYSTOSCOPY WITH STENT PLACEMENT Left 02/18/2017   Procedure: CYSTOSCOPY/ LEFT RETROGRADE/ WITH LEFT STENT PLACEMENT;  Surgeon: Kathie Rhodes, MD;  Location: [facility];  Service: Urology;  Laterality: Left;  . EXTRACORPOREAL SHOCK WAVE LITHOTRIPSY Left 02/18/2017   Procedure: LEFT EXTRACORPOREAL SHOCK WAVE LITHOTRIPSY (ESWL);  Surgeon: Franchot Gallo, MD;  Location: [facility];  Service: Urology;  Laterality: Left;  . EXTRACORPOREAL SHOCK WAVE LITHOTRIPSY Left 04/11/2017   Procedure: LEFT EXTRACORPOREAL SHOCK WAVE LITHOTRIPSY (ESWL);  Surgeon: Irine Seal, MD;  Location: [facility];  Service: Urology;  Laterality: Left;  . LITHOTRIPSY     02/18/17  . neck bipsy     beningn, in his 33s    SOCIAL HISTORY: Social History   Tobacco Use  . Smoking status: Never Smoker  . Smokeless tobacco: Never Used  Substance Use Topics  . Alcohol use: Yes    Comment: rarely  . Drug use: No    FAMILY HISTORY: Family History  Problem Relation Age of Onset  . Diabetes Father   . COPD Father   . Heart disease Father        ?  . Stroke Father   . Sleep apnea Father   . Obesity Father   . Heart disease Maternal Grandfather   . Heart disease Paternal Grandmother   . Diabetes Other        many fam members   . Hyperlipidemia  Mother        M and others  . Cancer Mother   . Colon cancer Neg Hx   . Prostate cancer Neg Hx     ROS: Review of Systems  Gastrointestinal: Negative for nausea and vomiting.  Musculoskeletal:       Negative for muscle weakness.  Endo/Heme/Allergies:       Positive for polyphagia.    PHYSICAL EXAM: Pt in no acute distress  RECENT LABS AND TESTS: BMET    Component Value Date/Time   NA 142 09/18/2018 0855   K 4.5 09/18/2018 0855   CL 103 09/18/2018 0855   CO2 22 09/18/2018 0855   GLUCOSE 118 (H) 09/18/2018 0855   GLUCOSE 122 (H) 06/24/2018 0809   BUN 16 09/18/2018 0855   CREATININE 1.03 09/18/2018 0855  CALCIUM 9.5 09/18/2018 0855   GFRNONAA 85 09/18/2018 0855   GFRAA 99 09/18/2018 0855   Lab Results  Component Value Date   HGBA1C 6.2 (H) 09/18/2018   HGBA1C 5.5 06/24/2018   HGBA1C 5.6 06/28/2017   Lab Results  Component Value Date   INSULIN 60.2 (H) 09/18/2018   CBC    Component Value Date/Time   WBC 5.4 09/18/2018 0855   WBC 5.6 06/24/2018 0809   RBC 4.81 09/18/2018 0855   RBC 4.76 06/24/2018 0809   HGB 15.1 09/18/2018 0855   HCT 41.5 09/18/2018 0855   PLT 176.0 06/24/2018 0809   MCV 86 09/18/2018 0855   MCH 31.4 09/18/2018 0855   MCH 31.0 02/06/2017 1025   MCHC 36.4 (H) 09/18/2018 0855   MCHC 34.0 06/24/2018 0809   RDW 13.5 09/18/2018 0855   LYMPHSABS 1.6 09/18/2018 0855   MONOABS 0.4 06/24/2018 0809   EOSABS 0.1 09/18/2018 0855   BASOSABS 0.0 09/18/2018 0855   Iron/TIBC/Ferritin/ %Sat No results found for: IRON, TIBC, FERRITIN, IRONPCTSAT Lipid Panel     Component Value Date/Time   CHOL 214 (H) 09/18/2018 0855   TRIG 375 (H) 09/18/2018 0855   HDL 25 (L) 09/18/2018 0855   CHOLHDL 8 06/24/2018 0809   VLDL 66.6 (H) 06/24/2018 0809   LDLCALC 114 (H) 09/18/2018 0855   LDLDIRECT 116.0 06/24/2018 0809   Hepatic Function Panel     Component Value Date/Time   PROT 6.6 09/18/2018 0855   ALBUMIN 4.5 09/18/2018 0855   AST 33 09/18/2018 0855    ALT 47 (H) 09/18/2018 0855   ALKPHOS 80 09/18/2018 0855   BILITOT 0.4 09/18/2018 0855      Component Value Date/Time   TSH 3.960 09/18/2018 0855   TSH 3.15 06/24/2018 0809   TSH 2.25 03/12/2016 1332   Results for BRAXTIN, BAMBA (MRN [id number]) as of 10/20/2018 09:47  Ref. Range 09/18/2018 08:55  Vitamin D, 25-Hydroxy Latest Ref Range: 30.0 - 100.0 ng/mL 13.3 (L)     I, Marcille Blanco, CMA, am acting as transcriptionist for Starlyn Skeans, MD I have reviewed the above documentation for accuracy and completeness, and I agree with the above. -Dennard Nip, MD

## 2018-10-26 ENCOUNTER — Other Ambulatory Visit (INDEPENDENT_AMBULATORY_CARE_PROVIDER_SITE_OTHER): Payer: Self-pay | Admitting: Family Medicine

## 2018-10-26 DIAGNOSIS — R7303 Prediabetes: Secondary | ICD-10-CM

## 2018-11-10 ENCOUNTER — Ambulatory Visit (INDEPENDENT_AMBULATORY_CARE_PROVIDER_SITE_OTHER): Payer: BLUE CROSS/BLUE SHIELD | Admitting: Family Medicine

## 2018-11-10 ENCOUNTER — Other Ambulatory Visit: Payer: Self-pay

## 2018-11-10 ENCOUNTER — Encounter (INDEPENDENT_AMBULATORY_CARE_PROVIDER_SITE_OTHER): Payer: Self-pay | Admitting: Family Medicine

## 2018-11-10 DIAGNOSIS — R7303 Prediabetes: Secondary | ICD-10-CM

## 2018-11-10 DIAGNOSIS — E559 Vitamin D deficiency, unspecified: Secondary | ICD-10-CM | POA: Diagnosis not present

## 2018-11-10 DIAGNOSIS — Z6841 Body Mass Index (BMI) 40.0 and over, adult: Secondary | ICD-10-CM | POA: Diagnosis not present

## 2018-11-10 MED ORDER — VITAMIN D (ERGOCALCIFEROL) 1.25 MG (50000 UNIT) PO CAPS: 50000.0000 [IU] | ORAL_CAPSULE | ORAL | 0 refills | Status: DC

## 2018-11-10 MED ORDER — METFORMIN HCL 500 MG PO TABS: 500.0000 mg | ORAL_TABLET | Freq: Two times a day (BID) | ORAL | 0 refills | Status: DC

## 2018-11-10 NOTE — Progress Notes (Signed)
Office: [phone]  /  Fax: [phone] TeleHealth Visit:  Cameron Valenzuela has verbally consented to this TeleHealth visit today. The patient is located at home, the provider is located at the [employer] [facility] office. The participants in this visit include the listed provider and patient. Cameron Valenzuela was unable to use realtime audiovisual technology today and the telehealth visit was conducted via telephone.   HPI:   Chief Complaint: OBESITY Cameron Valenzuela is here to discuss his progress with his obesity treatment plan. He is on the Category 4 plan and is following his eating plan approximately 80 % of the time. He states he is walking 15 to 20 minutes 3 to 4 times per week. Cameron Valenzuela continues to lose weight and thinks he has lost about 31 pounds since his 1st visit. His weight this morning at home was 225 pounds. He is tolerating his Category 4 plan well and notes decreased hunger since starting metformin.  We were unable to weigh the patient today for this TeleHealth visit. He feels as if he has lost weight since his last visit. He has lost 16 lbs since starting treatment with [place].  Pre-Diabetes Cameron Valenzuela has a diagnosis of pre-diabetes based on his elevated Hgb A1c and was informed this puts him at greater risk of developing diabetes. He started taking metformin and notes decreased polyphagia. He is doing well with his eating and exercise and feels well overall. He denies nausea, vomiting, or hypoglycemia.   Vitamin D Deficiency Cameron Valenzuela has a diagnosis of vitamin D deficiency. He is currently stable on vit D, but is not yet at goal. Cameron Valenzuela denies nausea, vomiting, or muscle weakness.  ASSESSMENT AND PLAN:  Prediabetes - Plan: metFORMIN (GLUCOPHAGE) 500 MG tablet  Vitamin D deficiency - Plan: Vitamin D, Ergocalciferol, (DRISDOL) 1.25 MG (50000 UT) CAPS capsule  Class 3 severe obesity with serious comorbidity and body mass index (BMI) of 40.0 to 44.9 in adult, unspecified obesity type  Cameron Valenzuela)  PLAN:  Pre-Diabetes Cameron Valenzuela will continue to work on weight loss, exercise, and decreasing simple carbohydrates in his diet to help decrease the risk of diabetes. He was informed that eating too many simple carbohydrates or too many calories at one sitting increases the likelihood of GI side effects. Cameron Valenzuela agreed to continue his diet and metformin 500 mg BID #30 with no refills and a prescription was written today. Cameron Valenzuela agreed to follow up with [place] as directed to monitor his progress in 2 weeks.   Vitamin D Deficiency Cameron Valenzuela was informed that low vitamin D levels contribute to fatigue and are associated with obesity, breast, and colon cancer. Cameron Valenzuela agrees to continue to take prescription Vit D @50 ,000 IU every week #4 with no refills and will follow up for routine testing of vitamin D, at least 2-3 times per year. He was informed of the risk of over-replacement of vitamin D and agrees to not increase his dose unless he discusses this with [place] first. Cameron Valenzuela agrees to follow up in 2 weeks as directed.  Obesity Cameron Valenzuela is currently in the action stage of change. As such, his goal is to continue with weight loss efforts. He has agreed to follow the Category 4 plan. Cameron Valenzuela has been instructed to work up to a goal of 150 minutes of combined cardio and strengthening exercise per week for weight loss and overall health benefits. We discussed the following Behavioral Modification Strategies today: ways to avoid night time snacking and better snacking choices.  Cameron Valenzuela has agreed to follow up  with our clinic in 2 weeks. He was informed of the importance of frequent follow up visits to maximize his success with intensive lifestyle modifications for his multiple health conditions.  ALLERGIES: No Known Allergies  MEDICATIONS: No current outpatient medications on file prior to visit.   No current facility-administered medications on file prior to visit.     PAST MEDICAL HISTORY: Past  Medical History:  Diagnosis Date  . Allergic rhinitis   . Allergy    seasonal  . Family history of adverse reaction to anesthesia    mother slow to wake up   . Fatigue   . History of kidney stones   . Kidney stones   . Knee pain, right   . Lower extremity edema   . Pinched nerve in neck   . Rosacea   . 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 Sleep apnea     PAST SURGICAL HISTORY: Past Surgical History:  Procedure Laterality Date  . Cystocopy     Dr. Karsten Ro  . CYSTOSCOPY WITH STENT PLACEMENT Left 02/18/2017   Procedure: CYSTOSCOPY/ LEFT RETROGRADE/ WITH LEFT STENT PLACEMENT;  Surgeon: Kathie Rhodes, MD;  Location: [facility];  Service: Urology;  Laterality: Left;  . EXTRACORPOREAL SHOCK WAVE LITHOTRIPSY Left 02/18/2017   Procedure: LEFT EXTRACORPOREAL SHOCK WAVE LITHOTRIPSY (ESWL);  Surgeon: Franchot Gallo, MD;  Location: [facility];  Service: Urology;  Laterality: Left;  . EXTRACORPOREAL SHOCK WAVE LITHOTRIPSY Left 04/11/2017   Procedure: LEFT EXTRACORPOREAL SHOCK WAVE LITHOTRIPSY (ESWL);  Surgeon: Irine Seal, MD;  Location: [facility];  Service: Urology;  Laterality: Left;  . LITHOTRIPSY     02/18/17  . neck bipsy     beningn, in his 51s    SOCIAL HISTORY: Social History   Tobacco Use  . Smoking status: Never Smoker  . Smokeless tobacco: Never Used  Substance Use Topics  . Alcohol use: Yes    Comment: rarely  . Drug use: No    FAMILY HISTORY: Family History  Problem Relation Age of Onset  . Diabetes Father   . COPD Father   . Heart disease Father        ?  . Stroke Father   . Sleep apnea Father   . Obesity Father   . Heart disease Maternal Grandfather   . Heart disease Paternal Grandmother   . Diabetes Other        many fam members   . Hyperlipidemia Mother        M and others  . Cancer Mother   . Colon cancer Neg Hx   . Prostate cancer Neg Hx     ROS: Review of Systems   Gastrointestinal: Negative for nausea and vomiting.  Musculoskeletal:       Negative for muscle weakness.  Endo/Heme/Allergies:       Positive for for polyphagia. Negative for hypoglycemia.    PHYSICAL EXAM: Pt in no acute distress  RECENT LABS AND TESTS: BMET    Component Value Date/Time   NA 142 09/18/2018 0855   K 4.5 09/18/2018 0855   CL 103 09/18/2018 0855   CO2 22 09/18/2018 0855   GLUCOSE 118 (H) 09/18/2018 0855   GLUCOSE 122 (H) 06/24/2018 0809   BUN 16 09/18/2018 0855   CREATININE 1.03 09/18/2018 0855   CALCIUM 9.5 09/18/2018 0855   GFRNONAA 85 09/18/2018 0855   GFRAA 99 09/18/2018 0855   Lab Results  Component Value Date   HGBA1C 6.2 (H) 09/18/2018   HGBA1C 5.5 06/24/2018   HGBA1C 5.6 06/28/2017   Lab Results  Component Value Date   INSULIN 60.2 (H) 09/18/2018   CBC    Component Value Date/Time   WBC 5.4 09/18/2018 0855   WBC 5.6 06/24/2018 0809   RBC 4.81 09/18/2018 0855   RBC 4.76 06/24/2018 0809   HGB 15.1 09/18/2018 0855   HCT 41.5 09/18/2018 0855   PLT 176.0 06/24/2018 0809   MCV 86 09/18/2018 0855   MCH 31.4 09/18/2018 0855   MCH 31.0 02/06/2017 1025   MCHC 36.4 (H) 09/18/2018 0855   MCHC 34.0 06/24/2018 0809   RDW 13.5 09/18/2018 0855   LYMPHSABS 1.6 09/18/2018 0855   MONOABS 0.4 06/24/2018 0809   EOSABS 0.1 09/18/2018 0855   BASOSABS 0.0 09/18/2018 0855   Iron/TIBC/Ferritin/ %Sat No results found for: IRON, TIBC, FERRITIN, IRONPCTSAT Lipid Panel     Component Value Date/Time   CHOL 214 (H) 09/18/2018 0855   TRIG 375 (H) 09/18/2018 0855   HDL 25 (L) 09/18/2018 0855   CHOLHDL 8 06/24/2018 0809   VLDL 66.6 (H) 06/24/2018 0809   LDLCALC 114 (H) 09/18/2018 0855   LDLDIRECT 116.0 06/24/2018 0809   Hepatic Function Panel     Component Value Date/Time   PROT 6.6 09/18/2018 0855   ALBUMIN 4.5 09/18/2018 0855   AST 33 09/18/2018 0855   ALT 47 (H) 09/18/2018 0855   ALKPHOS 80 09/18/2018 0855   BILITOT 0.4 09/18/2018 0855       Component Value Date/Time   TSH 3.960 09/18/2018 0855   TSH 3.15 06/24/2018 0809   TSH 2.25 03/12/2016 1332   Results for PEGGY, MONK (MRN [id number]) as of 11/10/2018 10:36  Ref. Range 09/18/2018 08:55  Vitamin D, 25-Hydroxy Latest Ref Range: 30.0 - 100.0 ng/mL 13.3 (L)     I, Marcille Blanco, CMA, am acting as transcriptionist for Starlyn Skeans, MD I have reviewed the above documentation for accuracy and completeness, and I agree with the above. -Dennard Nip, MD

## 2018-11-24 ENCOUNTER — Ambulatory Visit (INDEPENDENT_AMBULATORY_CARE_PROVIDER_SITE_OTHER): Payer: BLUE CROSS/BLUE SHIELD | Admitting: Family Medicine

## 2018-11-24 ENCOUNTER — Other Ambulatory Visit: Payer: Self-pay

## 2018-11-24 ENCOUNTER — Encounter (INDEPENDENT_AMBULATORY_CARE_PROVIDER_SITE_OTHER): Payer: Self-pay | Admitting: Family Medicine

## 2018-11-24 DIAGNOSIS — E669 Obesity, unspecified: Secondary | ICD-10-CM

## 2018-11-24 DIAGNOSIS — Z6834 Body mass index (BMI) 34.0-34.9, adult: Secondary | ICD-10-CM | POA: Diagnosis not present

## 2018-11-24 DIAGNOSIS — E559 Vitamin D deficiency, unspecified: Secondary | ICD-10-CM | POA: Diagnosis not present

## 2018-11-24 MED ORDER — VITAMIN D (ERGOCALCIFEROL) 1.25 MG (50000 UNIT) PO CAPS: 50000.0000 [IU] | ORAL_CAPSULE | ORAL | 0 refills | Status: DC

## 2018-11-25 NOTE — Progress Notes (Signed)
Office: [phone]  /  Fax: [phone] TeleHealth Visit:  Cameron Valenzuela has verbally consented to this TeleHealth visit today. The patient is located at work, the provider is located at the [employer] [facility] office. The participants in this visit include the listed provider and patient. The visit was conducted today via Webex.  HPI:   Chief Complaint: OBESITY Cameron Valenzuela is here to discuss his progress with his obesity treatment plan. He is on the Category 4 plan and is following his eating plan approximately 80 % of the time. He states he is walking 20 minutes 3 to 4 times per week. Cameron Valenzuela continues to do well with weight loss on his Category 4 plan. He states his weight this morning was 220 pounds, which means that he has lost over 35 pounds since starting his program. His hunger is controlled. He has questions about what to do if the meat shortage worsens.  We were unable to weigh the patient today for this TeleHealth visit. He feels as if he has lost weight since his last visit. He has lost 16 lbs since starting treatment with [place].  Vitamin D Deficiency Cameron Valenzuela has a diagnosis of vitamin D deficiency. He is currently stable on vit D, but is not yet at goal. Cameron Valenzuela denies nausea, vomiting, or muscle weakness.  ASSESSMENT AND PLAN:  Vitamin D deficiency - Plan: Vitamin D, Ergocalciferol, (DRISDOL) 1.25 MG (50000 UT) CAPS capsule  Class 1 obesity with serious comorbidity and body mass index (BMI) of 34.0 to 34.9 in adult, unspecified obesity type  PLAN:  Vitamin D Deficiency Cameron Valenzuela was informed that low vitamin D levels contribute to fatigue and are associated with obesity, breast, and colon cancer. Wake agrees to continue to take prescription Vit D @50 ,000 IU every week #4 with no refills and will follow up for routine testing of vitamin D, at least 2-3 times per year. He was informed of the risk of over-replacement of vitamin D and agrees to not increase his dose  unless he discusses this with [place] first. We will recheck labs in 1 month. Cameron Valenzuela agrees to follow up in 2 weeks as directed.  Obesity Cameron Valenzuela is currently in the action stage of change. As such, his goal is to continue with weight loss efforts. He has agreed to follow the Category 4 plan. Cameron Valenzuela has been instructed to work up to a goal of 150 minutes of combined cardio and strengthening exercise per week for weight loss and overall health benefits. We discussed the following Behavioral Modification Strategies today: increasing lean protein intake, decreasing simple carbohydrates, and work on meal planning and easy cooking plans. We discussed lean meat equivalents.  Cameron Valenzuela has agreed to follow up with our clinic in 2 weeks. He was informed of the importance of frequent follow up visits to maximize his success with intensive lifestyle modifications for his multiple health conditions.  ALLERGIES: No Known Allergies  MEDICATIONS: Current Outpatient Medications on File Prior to Visit  Medication Sig Dispense Refill   metFORMIN (GLUCOPHAGE) 500 MG tablet Take 1 tablet (500 mg total) by mouth 2 (two) times daily with a meal. 60 tablet 0   No current facility-administered medications on file prior to visit.     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 Karsten Ro   CYSTOSCOPY WITH STENT PLACEMENT Left 02/18/2017   Procedure: CYSTOSCOPY/ LEFT RETROGRADE/ WITH LEFT STENT  PLACEMENT;  Surgeon: Kathie Rhodes, MD;  Location: [facility];  Service: Urology;  Laterality: Left;   EXTRACORPOREAL SHOCK WAVE LITHOTRIPSY Left 02/18/2017   Procedure: LEFT EXTRACORPOREAL SHOCK WAVE LITHOTRIPSY (ESWL);  Surgeon: Franchot Gallo, MD;  Location: [facility];  Service: Urology;  Laterality: Left;   EXTRACORPOREAL SHOCK WAVE LITHOTRIPSY Left 04/11/2017   Procedure: LEFT EXTRACORPOREAL SHOCK WAVE LITHOTRIPSY (ESWL);  Surgeon: Irine Seal, MD;  Location: [facility];  Service: Urology;  Laterality: Left;   LITHOTRIPSY     02/18/17   neck bipsy     beningn, in his 78s    SOCIAL HISTORY: Social History   Tobacco Use   Smoking status: Never Smoker   Smokeless tobacco: Never Used  Substance Use Topics   Alcohol use: Yes    Comment: rarely   Drug use: No    FAMILY HISTORY: Family History  Problem Relation Age of Onset   Diabetes Father    COPD Father    Heart disease Father        ?   Stroke Father    Sleep apnea Father    Obesity Father    Heart disease Maternal Grandfather    Heart disease Paternal Grandmother    Diabetes Other        many fam members    Hyperlipidemia Mother        M and others   Cancer Mother    Colon cancer Neg Hx    Prostate cancer Neg Hx     ROS: Review of Systems  Gastrointestinal: Negative for nausea and vomiting.  Musculoskeletal:       Negative for muscle weakness.    PHYSICAL EXAM: Pt in no acute distress  RECENT LABS AND TESTS: BMET    Component Value Date/Time   NA 142 09/18/2018 0855   K 4.5 09/18/2018 0855   CL 103 09/18/2018 0855   CO2 22 09/18/2018 0855   GLUCOSE 118 (H) 09/18/2018 0855   GLUCOSE 122 (H) 06/24/2018 0809   BUN 16 09/18/2018 0855   CREATININE 1.03 09/18/2018 0855   CALCIUM 9.5 09/18/2018 0855   GFRNONAA 85 09/18/2018 0855   GFRAA 99 09/18/2018 0855   Lab Results  Component Value Date   HGBA1C 6.2 (H) 09/18/2018   HGBA1C 5.5 06/24/2018   HGBA1C 5.6 06/28/2017   Lab Results    Component Value Date   INSULIN 60.2 (H) 09/18/2018   CBC    Component Value Date/Time   WBC 5.4 09/18/2018 0855   WBC 5.6 06/24/2018 0809   RBC 4.81 09/18/2018 0855   RBC 4.76 06/24/2018 0809   HGB 15.1 09/18/2018 0855   HCT 41.5 09/18/2018 0855   PLT 176.0 06/24/2018 0809   MCV 86 09/18/2018 0855   MCH 31.4 09/18/2018 0855   MCH 31.0 02/06/2017 1025   MCHC 36.4 (H) 09/18/2018 0855   MCHC 34.0 06/24/2018 0809   RDW 13.5 09/18/2018 0855  LYMPHSABS 1.6 09/18/2018 0855   MONOABS 0.4 06/24/2018 0809   EOSABS 0.1 09/18/2018 0855   BASOSABS 0.0 09/18/2018 0855   Iron/TIBC/Ferritin/ %Sat No results found for: IRON, TIBC, FERRITIN, IRONPCTSAT Lipid Panel     Component Value Date/Time   CHOL 214 (H) 09/18/2018 0855   TRIG 375 (H) 09/18/2018 0855   HDL 25 (L) 09/18/2018 0855   CHOLHDL 8 06/24/2018 0809   VLDL 66.6 (H) 06/24/2018 0809   LDLCALC 114 (H) 09/18/2018 0855   LDLDIRECT 116.0 06/24/2018 0809   Hepatic Function Panel     Component Value Date/Time   PROT 6.6 09/18/2018 0855   ALBUMIN 4.5 09/18/2018 0855   AST 33 09/18/2018 0855   ALT 47 (H) 09/18/2018 0855   ALKPHOS 80 09/18/2018 0855   BILITOT 0.4 09/18/2018 0855      Component Value Date/Time   TSH 3.960 09/18/2018 0855   TSH 3.15 06/24/2018 0809   TSH 2.25 03/12/2016 1332   Results for LARONN, DEVONSHIRE (MRN [id number]) as of 11/25/2018 10:55  Ref. Range 09/18/2018 08:55  Vitamin D, 25-Hydroxy Latest Ref Range: 30.0 - 100.0 ng/mL 13.3 (L)    I, Marcille Blanco, CMA, am acting as transcriptionist for Starlyn Skeans, MD I have reviewed the above documentation for accuracy and completeness, and I agree with the above. -Dennard Nip, MD

## 2018-11-28 ENCOUNTER — Telehealth: Payer: Self-pay | Admitting: Internal Medicine

## 2018-11-28 NOTE — Telephone Encounter (Signed)
Needs virtual visit to discuss.

## 2018-11-28 NOTE — Telephone Encounter (Unsigned)
Copied from [place] [phone]. Topic: General - Other >> Nov 28, 2018  9:19 AM Celene Kras A wrote: Reason for CRM: Pt called stating he has been in contact with someone who tested positive for covid19. 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

## 2018-11-28 NOTE — Telephone Encounter (Signed)
Spoke with pt and offered VOV for pt to see provider, pt stated is going to [place] to be tested with his coworkers from his job, pt stated if needing appt will call the office.

## 2018-12-02 LAB — NOVEL CORONAVIRUS, NAA: SARS-CoV-2, NAA: DETECTED

## 2018-12-04 ENCOUNTER — Other Ambulatory Visit (INDEPENDENT_AMBULATORY_CARE_PROVIDER_SITE_OTHER): Payer: Self-pay | Admitting: Family Medicine

## 2018-12-04 ENCOUNTER — Ambulatory Visit (INDEPENDENT_AMBULATORY_CARE_PROVIDER_SITE_OTHER): Payer: BLUE CROSS/BLUE SHIELD | Admitting: Internal Medicine

## 2018-12-04 ENCOUNTER — Encounter: Payer: Self-pay | Admitting: Internal Medicine

## 2018-12-04 DIAGNOSIS — U071 COVID-19: Secondary | ICD-10-CM | POA: Diagnosis not present

## 2018-12-04 DIAGNOSIS — R7303 Prediabetes: Secondary | ICD-10-CM

## 2018-12-04 MED ORDER — AZITHROMYCIN 250 MG PO TABS: ORAL_TABLET | ORAL | 0 refills | Status: DC

## 2018-12-04 NOTE — Progress Notes (Signed)
Subjective:    Patient ID: Cameron Valenzuela, male    DOB: 09-06-1970, 48 y.o.   MRN: [id number]  DOS:  12/04/2018 Type of visit - description: Virtual Visit via Video Note  I connected with@ on 12/04/18 at 11:20 AM EDT by a video enabled telemedicine application and verified that I am speaking with the correct person using two identifiers.   THIS ENCOUNTER IS A VIRTUAL VISIT DUE TO COVID-19 - PATIENT WAS NOT SEEN IN THE OFFICE. PATIENT HAS CONSENTED TO VIRTUAL VISIT / TELEMEDICINE VISIT   Location of patient: home  Location of provider: office  I discussed the limitations of evaluation and management by telemedicine and the availability of in person appointments. The patient expressed understanding and agreed to proceed.  History of Present Illness: Acute visit, COVID-19 test positive.  The patient was exposed to a person who was later found to be COVID-19 two times, on the 11/25/2018 and  11/28/2018. Rease got a nasal swab 11/28/2018: Negative Developed low-grade fever 11/30/2018, 100.1 temperature. Got an order nasal swab 12/01/2018 which came back positive. He is self isolating since then and has noted mild symptoms like a "head cold". He calls for further advise.  Review of Systems Mild sore throat No chest pain or difficulty breathing Mild malaise and fatigue. No myalgias No nausea, vomiting, diarrhea Mild cough with occasional greenish sputum.  No wheezing or chest congestion per se. Thinks that the cough has to do more with postnasal dripping.  Past Medical History:  Diagnosis Date  . Allergic rhinitis   . Allergy    seasonal  . Family history of adverse reaction to anesthesia    mother slow to wake up   . Fatigue   . History of kidney stones   . Kidney stones   . Knee pain, right   . Lower extremity edema   . Pinched nerve in neck   . Rosacea   . 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  Sleep apnea     Past Surgical History:  Procedure Laterality Date  . Cystocopy     Dr. Karsten Ro  . CYSTOSCOPY WITH STENT PLACEMENT Left 02/18/2017   Procedure: CYSTOSCOPY/ LEFT RETROGRADE/ WITH LEFT STENT PLACEMENT;  Surgeon: Kathie Rhodes, MD;  Location: [facility];  Service: Urology;  Laterality: Left;  . EXTRACORPOREAL SHOCK WAVE LITHOTRIPSY Left 02/18/2017   Procedure: LEFT EXTRACORPOREAL SHOCK WAVE LITHOTRIPSY (ESWL);  Surgeon: Franchot Gallo, MD;  Location: [facility];  Service: Urology;  Laterality: Left;  . EXTRACORPOREAL SHOCK WAVE LITHOTRIPSY Left 04/11/2017   Procedure: LEFT EXTRACORPOREAL SHOCK WAVE LITHOTRIPSY (ESWL);  Surgeon: Irine Seal, MD;  Location: [facility];  Service: Urology;  Laterality: Left;  . LITHOTRIPSY     02/18/17  . neck bipsy     beningn, in his 43s    Social History   Socioeconomic History  . Marital status: Married    Spouse name: Vaughan Basta  . Number of children: 1  . Years of education: Not on file  . Highest education level: Not on file  Occupational History  . 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 Financial resource strain: Not on file  . Food insecurity:    Worry: Not on file    Inability: Not on file  . Transportation needs:    Medical: Not on file    Non-medical: Not on file  Tobacco Use  .  Smoking status: Never Smoker  . Smokeless tobacco: Never Used  Substance and Sexual Activity  . Alcohol use: Yes    Comment: rarely  . Drug use: No  . Sexual activity: Yes  Lifestyle  . Physical activity:    Days per week: Not on file    Minutes per session: Not on file  . Stress: Not on file  Relationships  . Social connections:    Talks on phone: Not on file    Gets together: Not on file    Attends religious service: Not on file    Active member of club or organization: Not on file    Attends meetings of clubs or organizations: Not on file    Relationship status: Not on file  . Intimate partner violence:    Fear of current  or ex partner: Not on file    Emotionally abused: Not on file    Physically abused: Not on file    Forced sexual activity: Not on file  Other Topics Concern  . Not on file  Social History Narrative   Household-- pt, wife, child     (mother in law [name], passed away 10/22/15)      Allergies as of 12/04/2018   No Known Allergies     Medication List       Accurate as of Dec 04, 2018 11:23 AM. If you have any questions, ask your nurse or doctor.        metFORMIN 500 MG tablet Commonly known as:  GLUCOPHAGE Take 1 tablet (500 mg total) by mouth 2 (two) times daily with a meal.   Vitamin D (Ergocalciferol) 1.25 MG (50000 UT) Caps capsule Commonly known as:  DRISDOL Take 1 capsule (50,000 Units total) by mouth every 7 (seven) days.           Objective:   Physical Exam There were no vitals taken for this visit. This is a virtual video visit, the patient is alert oriented x3, no apparent distress.    Assessment      Assessment Seasonal allergies Rosacea Kidney stones ([facility]) OSA per sleep Oct 21, 2016 , has a CPAP  SCC dx 10-21-2017  PLAN: COVID-19: The patient has COVID-19, his BMI is 36, he uses a CPAP, no hypertension, has hyperglycemia with a A1c of 6.2. Currently has mild symptoms, no chest pain no difficulty breathing.  No evidence of hypoperfusion. No O2 sat available. I follow the "up-to-date" pathway .  I think he needs to be treated at home We will send the following message.  It was nice to see you today. I am sorry you got infected with COVID-19, my recommendations are: Rest at home, self isolate in your room to protect your family. Continue to use your CPAP and clean it late daily. Drink plenty fluids Check your temperature twice a day. Take Tylenol as needed for fever and Robitussin-DM as needed for cough. I am sending a prescription for a Z-Pak, an antibiotic. Please go to the ER if you start feeling much worse:  high fever, increased chest  pain, difficulty breathing, a rash, severe headache, severe fatigue. More information is found at the [facility] webpage.  You need to consider yourself contagious, use a mask at all times, ask your family members to use a mask as well, practice good social distancing.   You will stop being contagious in 2-week AND if you feel better AND if you do not have fever for 3 days without taking Tylenol. Please call  me next week and update me on your symptoms.    Time spent 25 minutes   I discussed the assessment and treatment plan with the patient. The patient was provided an opportunity to ask questions and all were answered. The patient agreed with the plan and demonstrated an understanding of the instructions.   The patient was advised to call back or seek an in-person evaluation if the symptoms worsen or if the condition fails to improve as anticipated.

## 2018-12-05 NOTE — Assessment & Plan Note (Signed)
COVID-19: The patient has COVID-19, his BMI is 36, he uses a CPAP, no hypertension, has hyperglycemia with a A1c of 6.2. Currently has mild symptoms, no chest pain no difficulty breathing.  No evidence of hypoperfusion. No O2 sat available. I follow the "up-to-date" pathway .  I think he needs to be treated at home We will send the following message.  It was nice to see you today. I am sorry you got infected with COVID-19, my recommendations are: Rest at home, self isolate in your room to protect your family. Continue to use your CPAP and clean it late daily. Drink plenty fluids Check your temperature twice a day. Take Tylenol as needed for fever and Robitussin-DM as needed for cough. I am sending a prescription for a Z-Pak, an antibiotic. Please go to the ER if you start feeling much worse:  high fever, increased chest pain, difficulty breathing, a rash, severe headache, severe fatigue. More information is found at the [facility] webpage.  You need to consider yourself contagious, use a mask at all times, ask your family members to use a mask as well, practice good social distancing.   You will stop being contagious in 2-week AND if you feel better AND if you do not have fever for 3 days without taking Tylenol. Please call me next week and update me on your symptoms.

## 2018-12-09 ENCOUNTER — Telehealth: Payer: Self-pay | Admitting: Internal Medicine

## 2018-12-09 IMAGING — CR DG ABDOMEN 1V
2 series · 2 of 2 positions shown · non-contrast
Comparison: Abdominal radiograph performed 01/31/2017

CLINICAL DATA: Preoperative radiograph for lithotripsy for
left-sided renal stone. Abdominal discomfort. Initial encounter.

EXAM:
ABDOMEN - 1 VIEW

[t abdomen supine (1 of 2)]
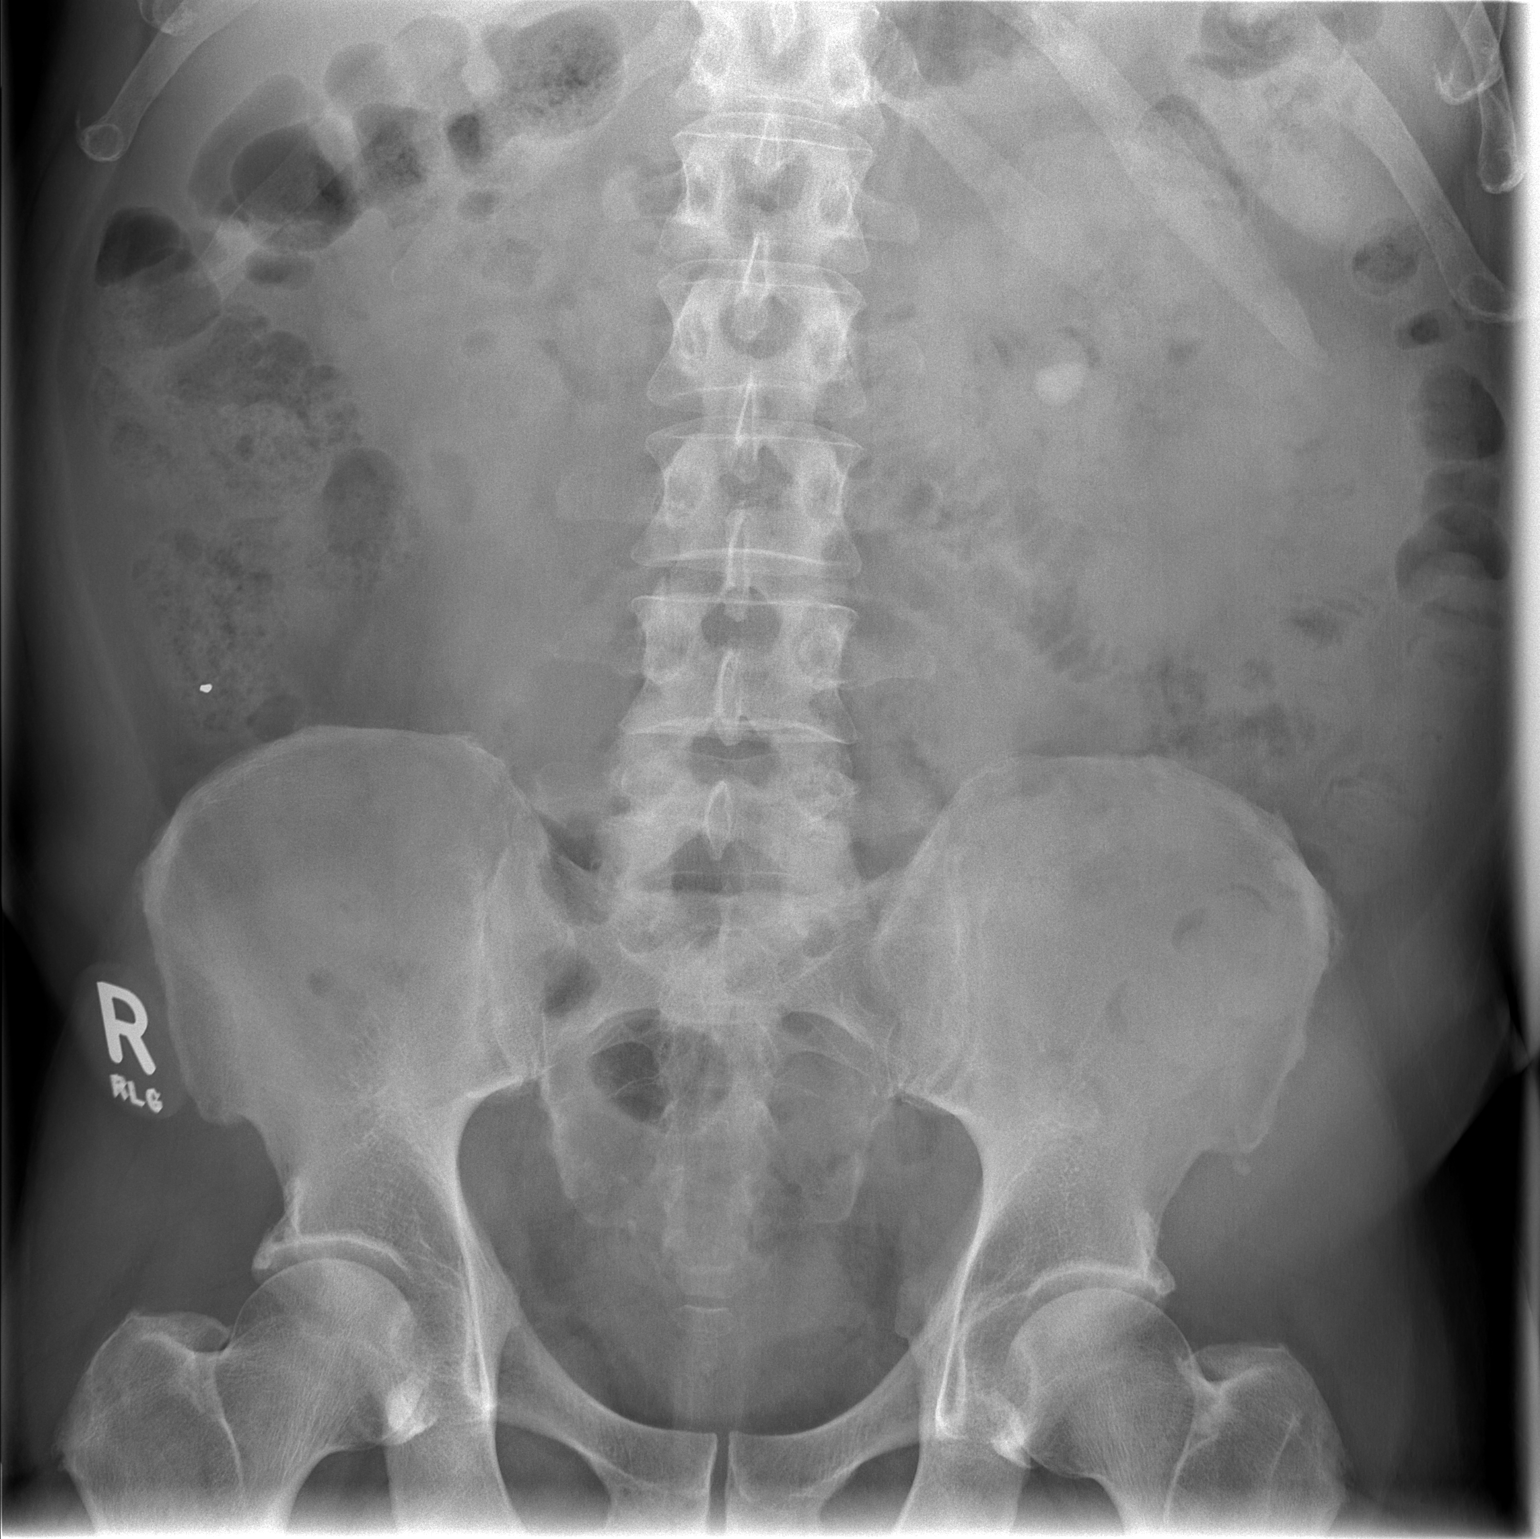

[t abdomen supine (2 of 2)]
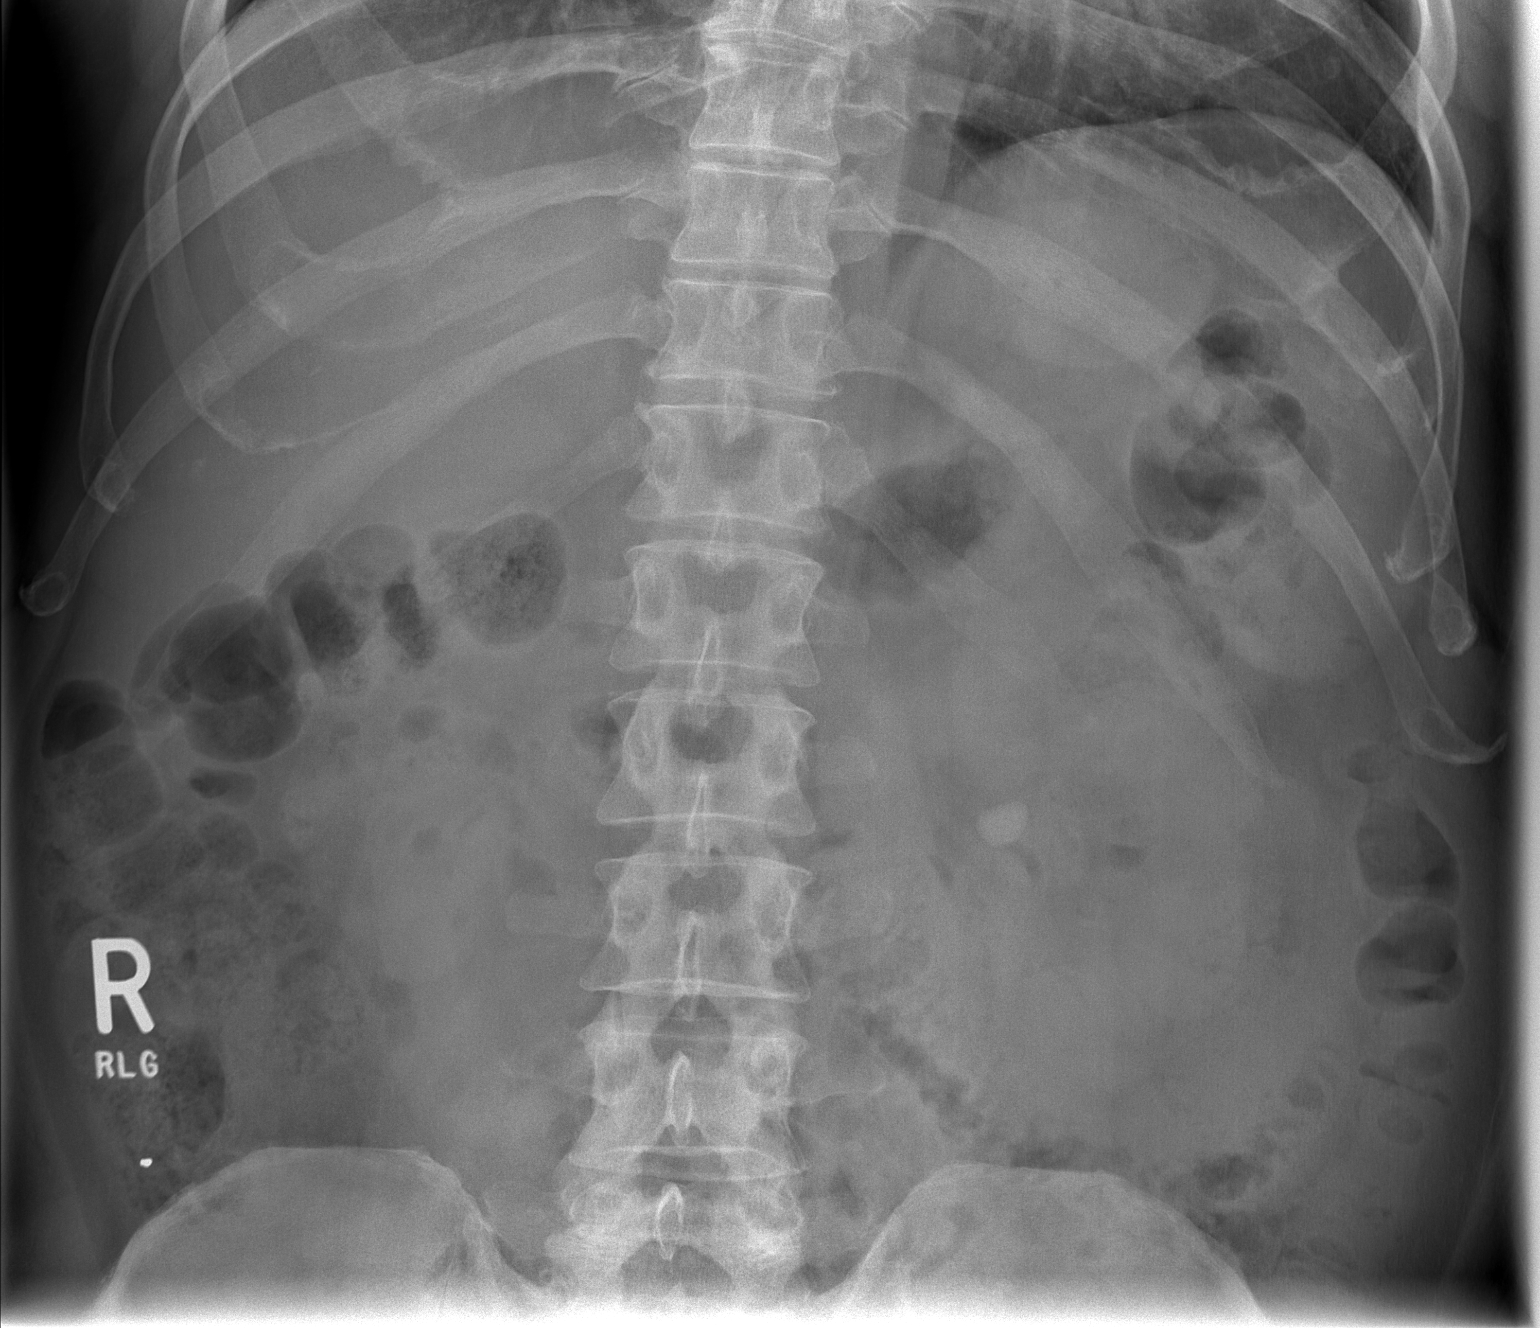

[2 of 2 positions shown; findings below may reference images not displayed]

FINDINGS: A 1.6 cm stone is noted at the left renal pelvis.

The visualized bowel gas pattern is grossly unremarkable. No free
intra-abdominal air is seen, though evaluation for free air is
limited on supine views. No acute osseous abnormalities are seen.
The visualized lung bases are grossly clear.
IMPRESSION: 1.6 cm stone at the left renal pelvis.

## 2018-12-09 NOTE — Telephone Encounter (Signed)
Patient was seen 5 days ago after a COVID-19 testing came back positive. Today he reports that he is feeling better. Temperature was slt elevated time @ 99.0 but the rest of the time is less than 98.6. No chest pain, difficulty breathing.   Recommend to call me if symptoms resurface

## 2018-12-10 ENCOUNTER — Ambulatory Visit (INDEPENDENT_AMBULATORY_CARE_PROVIDER_SITE_OTHER): Payer: BLUE CROSS/BLUE SHIELD | Admitting: Family Medicine

## 2018-12-10 ENCOUNTER — Encounter (INDEPENDENT_AMBULATORY_CARE_PROVIDER_SITE_OTHER): Payer: Self-pay | Admitting: Family Medicine

## 2018-12-10 ENCOUNTER — Other Ambulatory Visit: Payer: Self-pay

## 2018-12-10 DIAGNOSIS — R7303 Prediabetes: Secondary | ICD-10-CM | POA: Diagnosis not present

## 2018-12-10 DIAGNOSIS — E669 Obesity, unspecified: Secondary | ICD-10-CM | POA: Diagnosis not present

## 2018-12-10 DIAGNOSIS — E559 Vitamin D deficiency, unspecified: Secondary | ICD-10-CM | POA: Diagnosis not present

## 2018-12-10 DIAGNOSIS — Z6834 Body mass index (BMI) 34.0-34.9, adult: Secondary | ICD-10-CM

## 2018-12-10 MED ORDER — METFORMIN HCL 500 MG PO TABS: 500.0000 mg | ORAL_TABLET | Freq: Two times a day (BID) | ORAL | 0 refills | Status: DC

## 2018-12-10 NOTE — Progress Notes (Signed)
Office: [phone]  /  Fax: [phone] TeleHealth Visit:  Cameron Valenzuela has verbally consented to this TeleHealth visit today. The patient is located at home, the provider is located at the [employer] [facility] office. The participants in this visit include the listed provider and patient. The visit was conducted today via Webex.  HPI:   Chief Complaint: OBESITY Cameron Valenzuela is here to discuss his progress with his obesity treatment plan. He is on the Category 4 plan and is following his eating plan approximately 100% of the time. He states he is exercising 0 minutes 0 times per week. Cameron Valenzuela is down to 210 lbs and has lost about 46 lbs since his initial office visit. He is very happy with Category 4 and he reports his hunger is well controlled. He is adhering to the meal plan very well. He has a mild case of COVID and is still febrile but recovering well.  We were unable to weigh the patient today for this TeleHealth visit. He reports his current weight is 210 lbs. He has lost approximately 46 lbs since starting treatment with [place].  Pre-Diabetes Cameron Valenzuela has a diagnosis of prediabetes based on his elevated Hgb A1c and was informed this puts him at greater risk of developing diabetes. His A1c was 6.2 on 09/18/2018. He is taking metformin currently and continues to work on diet and exercise to decrease risk of diabetes. He denies polyphagia.  Vitamin D deficiency Cameron Valenzuela has a diagnosis of Vitamin D deficiency, which is not at goal. Her last Vitamin D level was reported to be 13.3 on 09/18/2018. He is currently taking prescription Vit D and denies nausea, vomiting or muscle weakness.  ASSESSMENT AND PLAN:  Prediabetes - Plan: metFORMIN (GLUCOPHAGE) 500 MG tablet  Vitamin D deficiency  Class 1 obesity with serious comorbidity and body mass index (BMI) of 34.0 to 34.9 in adult, unspecified obesity type  PLAN:  Pre-Diabetes Cameron Valenzuela will continue to work on weight loss, exercise,  and decreasing simple carbohydrates in his diet to help decrease the risk of diabetes. Cameron Valenzuela was given a refill on his metformin 500 mg BID with meals #60 with 0 refills and he agrees to follow-up with our clinic in 2 weeks. His A1c will be checked at his next in person office visit.  Vitamin D Deficiency Cameron Valenzuela was informed that low Vitamin D levels contributes to fatigue and are associated with obesity, breast, and colon cancer. He agrees to continue taking prescription Vit D and will follow-up for routine testing of Vitamin D at his next in person office visit. He was informed of the risk of over-replacement of Vitamin D and agrees to not increase his dose unless he discusses this with [place] first. Cameron Valenzuela agrees to follow-up with our clinic in 2 weeks.  Obesity Cameron Valenzuela is currently in the action stage of change. As such, his goal is to continue with weight loss efforts. He has agreed to follow the Category 4 plan. Handouts were sent to the patient via MyChart on Recipes. Cameron Valenzuela has been instructed to walk when recovered from [place] for weight loss and overall health benefits. We discussed the following Behavioral Modification Strategies today: work on meal planning, easy cooking plans, and planning for success.  Cameron Valenzuela has agreed to follow-up with our clinic in 2 weeks. He was informed of the importance of frequent follow-up visits to maximize his success with intensive lifestyle modifications for his multiple health conditions.  ALLERGIES: No Known Allergies  MEDICATIONS: Current Outpatient Medications on  File Prior to Visit  Medication Sig Dispense Refill  . azithromycin (ZITHROMAX Z-PAK) 250 MG tablet 2 tabs a day the first day, then 1 tab a day x 4 days 6 tablet 0  . Vitamin D, Ergocalciferol, (DRISDOL) 1.25 MG (50000 UT) CAPS capsule Take 1 capsule (50,000 Units total) by mouth every 7 (seven) days. 4 capsule 0   No current facility-administered medications on file prior to visit.      PAST MEDICAL HISTORY: Past Medical History:  Diagnosis Date  . Allergic rhinitis   . Allergy    seasonal  . Family history of adverse reaction to anesthesia    mother slow to wake up   . Fatigue   . History of kidney stones   . Kidney stones   . Knee pain, right   . Lower extremity edema   . Pinched nerve in neck   . Rosacea   . 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 Sleep apnea     PAST SURGICAL HISTORY: Past Surgical History:  Procedure Laterality Date  . Cystocopy     Dr. Karsten Ro  . CYSTOSCOPY WITH STENT PLACEMENT Left 02/18/2017   Procedure: CYSTOSCOPY/ LEFT RETROGRADE/ WITH LEFT STENT PLACEMENT;  Surgeon: Kathie Rhodes, MD;  Location: [facility];  Service: Urology;  Laterality: Left;  . EXTRACORPOREAL SHOCK WAVE LITHOTRIPSY Left 02/18/2017   Procedure: LEFT EXTRACORPOREAL SHOCK WAVE LITHOTRIPSY (ESWL);  Surgeon: Franchot Gallo, MD;  Location: [facility];  Service: Urology;  Laterality: Left;  . EXTRACORPOREAL SHOCK WAVE LITHOTRIPSY Left 04/11/2017   Procedure: LEFT EXTRACORPOREAL SHOCK WAVE LITHOTRIPSY (ESWL);  Surgeon: Irine Seal, MD;  Location: [facility];  Service: Urology;  Laterality: Left;  . LITHOTRIPSY     02/18/17  . neck bipsy     beningn, in his 57s    SOCIAL HISTORY: Social History   Tobacco Use  . Smoking status: Never Smoker  . Smokeless tobacco: Never Used  Substance Use Topics  . Alcohol use: Yes    Comment: rarely  . Drug use: No    FAMILY HISTORY: Family History  Problem Relation Age of Onset  . Diabetes Father   . COPD Father   . Heart disease Father        ?  . Stroke Father   . Sleep apnea Father   . Obesity Father   . Heart disease Maternal Grandfather   . Heart disease Paternal Grandmother   . Diabetes Other        many fam members   . Hyperlipidemia Mother        M and others  . Cancer Mother   . Colon cancer Neg Hx   . Prostate cancer Neg Hx    ROS:  Review of Systems  Gastrointestinal: Negative for nausea and vomiting.  Musculoskeletal:       Negative for muscle weakness.  Endo/Heme/Allergies:       Negative for polyphagia.   PHYSICAL EXAM: Pt in no acute distress  RECENT LABS AND TESTS: BMET    Component Value Date/Time   NA 142 09/18/2018 0855   K 4.5 09/18/2018 0855   CL 103 09/18/2018 0855   CO2 22 09/18/2018 0855   GLUCOSE 118 (H) 09/18/2018 0855   GLUCOSE 122 (H) 06/24/2018 0809   BUN 16 09/18/2018 0855   CREATININE 1.03 09/18/2018 0855   CALCIUM 9.5 09/18/2018 0855   GFRNONAA 85 09/18/2018 0855  GFRAA 99 09/18/2018 0855   Lab Results  Component Value Date   HGBA1C 6.2 (H) 09/18/2018   HGBA1C 5.5 06/24/2018   HGBA1C 5.6 06/28/2017   Lab Results  Component Value Date   INSULIN 60.2 (H) 09/18/2018   CBC    Component Value Date/Time   WBC 5.4 09/18/2018 0855   WBC 5.6 06/24/2018 0809   RBC 4.81 09/18/2018 0855   RBC 4.76 06/24/2018 0809   HGB 15.1 09/18/2018 0855   HCT 41.5 09/18/2018 0855   PLT 176.0 06/24/2018 0809   MCV 86 09/18/2018 0855   MCH 31.4 09/18/2018 0855   MCH 31.0 02/06/2017 1025   MCHC 36.4 (H) 09/18/2018 0855   MCHC 34.0 06/24/2018 0809   RDW 13.5 09/18/2018 0855   LYMPHSABS 1.6 09/18/2018 0855   MONOABS 0.4 06/24/2018 0809   EOSABS 0.1 09/18/2018 0855   BASOSABS 0.0 09/18/2018 0855   Iron/TIBC/Ferritin/ %Sat No results found for: IRON, TIBC, FERRITIN, IRONPCTSAT Lipid Panel     Component Value Date/Time   CHOL 214 (H) 09/18/2018 0855   TRIG 375 (H) 09/18/2018 0855   HDL 25 (L) 09/18/2018 0855   CHOLHDL 8 06/24/2018 0809   VLDL 66.6 (H) 06/24/2018 0809   LDLCALC 114 (H) 09/18/2018 0855   LDLDIRECT 116.0 06/24/2018 0809   Hepatic Function Panel     Component Value Date/Time   PROT 6.6 09/18/2018 0855   ALBUMIN 4.5 09/18/2018 0855   AST 33 09/18/2018 0855   ALT 47 (H) 09/18/2018 0855   ALKPHOS 80 09/18/2018 0855   BILITOT 0.4 09/18/2018 0855      Component Value  Date/Time   TSH 3.960 09/18/2018 0855   TSH 3.15 06/24/2018 0809   TSH 2.25 03/12/2016 1332   Results for ROMEN, YUTZY (MRN [id number]) as of 12/10/2018 15:49  Ref. Range 09/18/2018 08:55  Vitamin D, 25-Hydroxy Latest Ref Range: 30.0 - 100.0 ng/mL 13.3 (L)    I, Michaelene Song, am acting as Location manager for [employer], [employer].  I have reviewed the above documentation for accuracy and completeness, and I agree with the above.  - Dawn Whitmire, FNP-C.

## 2018-12-11 ENCOUNTER — Encounter (INDEPENDENT_AMBULATORY_CARE_PROVIDER_SITE_OTHER): Payer: Self-pay | Admitting: Family Medicine

## 2018-12-11 DIAGNOSIS — E669 Obesity, unspecified: Secondary | ICD-10-CM | POA: Insufficient documentation

## 2018-12-11 DIAGNOSIS — R7303 Prediabetes: Secondary | ICD-10-CM | POA: Insufficient documentation

## 2018-12-11 DIAGNOSIS — E559 Vitamin D deficiency, unspecified: Secondary | ICD-10-CM | POA: Insufficient documentation

## 2018-12-11 DIAGNOSIS — Z6830 Body mass index (BMI) 30.0-30.9, adult: Secondary | ICD-10-CM | POA: Insufficient documentation

## 2018-12-23 ENCOUNTER — Encounter (INDEPENDENT_AMBULATORY_CARE_PROVIDER_SITE_OTHER): Payer: Self-pay | Admitting: Family Medicine

## 2018-12-23 ENCOUNTER — Other Ambulatory Visit: Payer: Self-pay

## 2018-12-23 ENCOUNTER — Ambulatory Visit (INDEPENDENT_AMBULATORY_CARE_PROVIDER_SITE_OTHER): Payer: BLUE CROSS/BLUE SHIELD | Admitting: Family Medicine

## 2018-12-23 DIAGNOSIS — E559 Vitamin D deficiency, unspecified: Secondary | ICD-10-CM

## 2018-12-23 DIAGNOSIS — R7303 Prediabetes: Secondary | ICD-10-CM

## 2018-12-23 DIAGNOSIS — E669 Obesity, unspecified: Secondary | ICD-10-CM | POA: Diagnosis not present

## 2018-12-23 DIAGNOSIS — Z6834 Body mass index (BMI) 34.0-34.9, adult: Secondary | ICD-10-CM | POA: Diagnosis not present

## 2018-12-23 MED ORDER — VITAMIN D (ERGOCALCIFEROL) 1.25 MG (50000 UNIT) PO CAPS: 50000.0000 [IU] | ORAL_CAPSULE | ORAL | 0 refills | Status: DC

## 2018-12-23 NOTE — Progress Notes (Signed)
Office: [phone]  /  Fax: [phone] TeleHealth Visit:  Cameron Valenzuela has verbally consented to this TeleHealth visit today. The patient is located at home, the provider is located at the [employer] [facility] office. The participants in this visit include the listed provider and patient. [place] was unable to use realtime audiovisual technology today and the telehealth visit was conducted via telephone (18 min call).   HPI:   Chief Complaint: OBESITY Cameron Valenzuela is here to discuss his progress with his obesity treatment plan. He is on the  follow the Category 4 plan and is following his eating plan approximately 50 % of the time. He states he is exercising 0 minutes 0 times per week. Cameron Valenzuela has recovered from a recent case of COVID-Valenzuela. He is up 5 lbs since his last visit. He weighs 215 lbs today. He is back at work and doesn't do as well on the plan due to more food options where he works than at home. He has eaten out more which he feels has led to weight gain. He is motivated to get back on track.  We were unable to weigh the patient today for this TeleHealth visit. He feels as if he has gained 5 lbs since his last visit. He has lost 46 lbs since starting treatment with [place].  Vitamin D Deficiency Cameron Valenzuela has a diagnosis of vitamin D deficiency. He is currently taking prescription Vit D, but level is not at goal. Last Vit D level was 13.3 on 09/18/2018. He denies nausea, vomiting or muscle weakness.  Pre-Diabetes Cameron Valenzuela has a diagnosis of pre-diabetes based on his elevated Hgb A1c at 6.2 on 09/18/2018. He was informed this puts him at greater risk of developing diabetes. He is taking metformin currently and notes occasional polyphagia. He continues to work on diet and exercise to decrease risk of diabetes. He denies nausea or hypoglycemia.  ASSESSMENT AND PLAN:  Vitamin D deficiency - Plan: Vitamin D, Ergocalciferol, (DRISDOL) 1.25 MG (50000 UT) CAPS capsule  Prediabetes  Class  1 obesity with serious comorbidity and body mass index (BMI) of 34.0 to 34.9 in adult, unspecified obesity type  PLAN:  Vitamin D Deficiency Cameron Valenzuela was informed that low vitamin D levels contributes to fatigue and are associated with obesity, breast, and colon cancer. Cameron Valenzuela agrees to continue taking prescription Vit D @50 ,000 IU every week #4 and we will refill for 1 month. He will follow up for routine testing of vitamin D, at least 2-3 times per year. He was informed of the risk of over-replacement of vitamin D and agrees to not increase his dose unless he discusses this with [place] first. Cameron Valenzuela agrees to follow up with our clinic in 2 weeks.  Pre-Diabetes Cameron Valenzuela will continue his meal plan, and will continue to work on weight loss, exercise, and decreasing simple carbohydrates in his diet to help decrease the risk of diabetes. Cameron Valenzuela agrees to continue taking metformin BID, but we may increase metformin to TID at next visit. Cameron Valenzuela agrees to follow up with our clinic in 2 weeks as directed to monitor his progress.  Obesity Cameron Valenzuela is currently in the action stage of change. As such, his goal is to continue with weight loss efforts He has agreed to follow the Category 4 plan Cameron Valenzuela has been instructed to begin walking as tolerated. He is still recovering from Cameron Valenzuela.. We discussed the following Behavioral Modification Strategies today: decrease eating out and work on meal planning and easy cooking plans, and planning  for success   Cameron Valenzuela has agreed to follow up with our clinic in 2 weeks. He was informed of the importance of frequent follow up visits to maximize his success with intensive lifestyle modifications for his multiple health conditions.  ALLERGIES: No Known Allergies  MEDICATIONS: Current Outpatient Medications on File Prior to Visit  Medication Sig Dispense Refill  . azithromycin (ZITHROMAX Z-PAK) 250 MG tablet 2 tabs a day the first day, then 1 tab a day x 4 days 6  tablet 0  . metFORMIN (GLUCOPHAGE) 500 MG tablet Take 1 tablet (500 mg total) by mouth 2 (two) times daily with a meal. 60 tablet 0   No current facility-administered medications on file prior to visit.     PAST MEDICAL HISTORY: Past Medical History:  Diagnosis Date  . Allergic rhinitis   . Allergy    seasonal  . Family history of adverse reaction to anesthesia    mother slow to wake up   . Fatigue   . History of kidney stones   . Kidney stones   . Knee pain, right   . Lower extremity edema   . Pinched nerve in neck   . Rosacea   . 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 Sleep apnea     PAST SURGICAL HISTORY: Past Surgical History:  Procedure Laterality Date  . Cystocopy     Dr. Karsten Ro  . CYSTOSCOPY WITH STENT PLACEMENT Left 02/18/2017   Procedure: CYSTOSCOPY/ LEFT RETROGRADE/ WITH LEFT STENT PLACEMENT;  Surgeon: Kathie Rhodes, MD;  Location: [facility];  Service: Urology;  Laterality: Left;  . EXTRACORPOREAL SHOCK WAVE LITHOTRIPSY Left 02/18/2017   Procedure: LEFT EXTRACORPOREAL SHOCK WAVE LITHOTRIPSY (ESWL);  Surgeon: Franchot Gallo, MD;  Location: [facility];  Service: Urology;  Laterality: Left;  . EXTRACORPOREAL SHOCK WAVE LITHOTRIPSY Left 04/11/2017   Procedure: LEFT EXTRACORPOREAL SHOCK WAVE LITHOTRIPSY (ESWL);  Surgeon: Irine Seal, MD;  Location: [facility];  Service: Urology;  Laterality: Left;  . LITHOTRIPSY     02/18/17  . neck bipsy     beningn, in his 16s    SOCIAL HISTORY: Social History   Tobacco Use  . Smoking status: Never Smoker  . Smokeless tobacco: Never Used  Substance Use Topics  . Alcohol use: Yes    Comment: rarely  . Drug use: No    FAMILY HISTORY: Family History  Problem Relation Age of Onset  . Diabetes Father   . COPD Father   . Heart disease Father        ?  . Stroke Father   . Sleep apnea Father   . Obesity Father   . Heart disease Maternal Grandfather   .  Heart disease Paternal Grandmother   . Diabetes Other        many fam members   . Hyperlipidemia Mother        M and others  . Cancer Mother   . Colon cancer Neg Hx   . Prostate cancer Neg Hx     ROS: Review of Systems  Constitutional: Negative for weight loss.  Gastrointestinal: Negative for nausea and vomiting.  Musculoskeletal:       Negative muscle weakness  Endo/Heme/Allergies:       Positive polyphagia Negative hypoglycemia    PHYSICAL EXAM: Pt in no acute distress  RECENT LABS AND TESTS: BMET    Component Value Date/Time   NA 142 09/18/2018 0855  K 4.5 09/18/2018 0855   CL 103 09/18/2018 0855   CO2 22 09/18/2018 0855   GLUCOSE 118 (H) 09/18/2018 0855   GLUCOSE 122 (H) 06/24/2018 0809   BUN 16 09/18/2018 0855   CREATININE 1.03 09/18/2018 0855   CALCIUM 9.5 09/18/2018 0855   GFRNONAA 85 09/18/2018 0855   GFRAA 99 09/18/2018 0855   Lab Results  Component Value Date   HGBA1C 6.2 (H) 09/18/2018   HGBA1C 5.5 06/24/2018   HGBA1C 5.6 06/28/2017   Lab Results  Component Value Date   INSULIN 60.2 (H) 09/18/2018   CBC    Component Value Date/Time   WBC 5.4 09/18/2018 0855   WBC 5.6 06/24/2018 0809   RBC 4.81 09/18/2018 0855   RBC 4.76 06/24/2018 0809   HGB 15.1 09/18/2018 0855   HCT 41.5 09/18/2018 0855   PLT 176.0 06/24/2018 0809   MCV 86 09/18/2018 0855   MCH 31.4 09/18/2018 0855   MCH 31.0 02/06/2017 1025   MCHC 36.4 (H) 09/18/2018 0855   MCHC 34.0 06/24/2018 0809   RDW 13.5 09/18/2018 0855   LYMPHSABS 1.6 09/18/2018 0855   MONOABS 0.4 06/24/2018 0809   EOSABS 0.1 09/18/2018 0855   BASOSABS 0.0 09/18/2018 0855   Iron/TIBC/Ferritin/ %Sat No results found for: IRON, TIBC, FERRITIN, IRONPCTSAT Lipid Panel     Component Value Date/Time   CHOL 214 (H) 09/18/2018 0855   TRIG 375 (H) 09/18/2018 0855   HDL 25 (L) 09/18/2018 0855   CHOLHDL 8 06/24/2018 0809   VLDL 66.6 (H) 06/24/2018 0809   LDLCALC 114 (H) 09/18/2018 0855   LDLDIRECT 116.0  06/24/2018 0809   Hepatic Function Panel     Component Value Date/Time   PROT 6.6 09/18/2018 0855   ALBUMIN 4.5 09/18/2018 0855   AST 33 09/18/2018 0855   ALT 47 (H) 09/18/2018 0855   ALKPHOS 80 09/18/2018 0855   BILITOT 0.4 09/18/2018 0855      Component Value Date/Time   TSH 3.960 09/18/2018 0855   TSH 3.15 06/24/2018 0809   TSH 2.25 03/12/2016 1332      I, Trixie Dredge, am acting as Location manager for [employer], FNP-C.  I have reviewed the above documentation for accuracy and completeness, and I agree with the above.  - Inman Fettig, FNP-C.

## 2018-12-25 ENCOUNTER — Encounter (INDEPENDENT_AMBULATORY_CARE_PROVIDER_SITE_OTHER): Payer: Self-pay | Admitting: Family Medicine

## 2018-12-26 ENCOUNTER — Ambulatory Visit: Payer: BLUE CROSS/BLUE SHIELD | Admitting: Internal Medicine

## 2019-01-05 ENCOUNTER — Ambulatory Visit (INDEPENDENT_AMBULATORY_CARE_PROVIDER_SITE_OTHER): Payer: BC Managed Care – PPO | Admitting: Family Medicine

## 2019-01-05 ENCOUNTER — Encounter (INDEPENDENT_AMBULATORY_CARE_PROVIDER_SITE_OTHER): Payer: Self-pay | Admitting: Family Medicine

## 2019-01-05 ENCOUNTER — Other Ambulatory Visit: Payer: Self-pay

## 2019-01-05 DIAGNOSIS — E669 Obesity, unspecified: Secondary | ICD-10-CM

## 2019-01-05 DIAGNOSIS — R7303 Prediabetes: Secondary | ICD-10-CM | POA: Diagnosis not present

## 2019-01-05 DIAGNOSIS — Z6834 Body mass index (BMI) 34.0-34.9, adult: Secondary | ICD-10-CM | POA: Diagnosis not present

## 2019-01-05 NOTE — Progress Notes (Signed)
pre

## 2019-01-06 NOTE — Progress Notes (Signed)
Office: [phone]  /  Fax: [phone] TeleHealth Visit:  Cameron Valenzuela has verbally consented to this TeleHealth visit today. The patient is located at [facility], the provider is located at the [facility] office. The participants in this visit include the listed provider and patient. The visit was conducted today via Webex.  HPI:   Chief Complaint: OBESITY Cameron Valenzuela is here to discuss his progress with his obesity treatment plan. He is on the Category 4 plan and is following his eating plan approximately 75% of the time. He states he is walking most of the day at work. Cameron Valenzuela has lost 4 lbs over the last 2 weeks. He states he weighs 211 lbs today.  We were unable to weigh the patient today for this TeleHealth visit. He feels as if he has lost 4 lbs since his last visit. He has lost 16 lbs since starting treatment with [place].  Pre-Diabetes Cameron Valenzuela has a diagnosis of prediabetes based on his elevated Hgb A1c and was informed this puts him at greater risk of developing diabetes. His last A1c was 6.2 on 09/18/2018.  He is taking metformin BID currently which helps with polyphagia. He continues to work on diet and exercise to decrease risk of diabetes. He denies nausea or hypoglycemia.  ASSESSMENT AND PLAN:  Prediabetes  Class 1 obesity with serious comorbidity and body mass index (BMI) of 34.0 to 34.9 in adult, unspecified obesity type  PLAN:  Pre-Diabetes Cameron Valenzuela will continue to work on weight loss, exercise, and decreasing simple carbohydrates in his diet to help decrease the risk of diabetes. We dicussed metformin including benefits and risks. He was informed that eating too many simple carbohydrates or too many calories at one sitting increases the likelihood of GI side effects. Finbar will continue metformin and his meal plan. We will check his A1c and insulin levels at his next visit. Bowe agrees to follow-up with [place] as directed to monitor his  progress.  I spent > than 50% of the 15 minute visit on counseling as documented in the note.  Obesity Cameron Valenzuela is currently in the action stage of change. As such, his goal is to continue with weight loss efforts. He has agreed to follow the Category 4 plan. Cameron Valenzuela has been instructed to continue his current exercise regimen for weight loss and overall health benefits. We discussed the following Behavioral Modification Strategies today: planning for success.  Cameron Valenzuela has agreed to follow-up with our clinic in 2 weeks. He was informed of the importance of frequent follow-up visits to maximize his success with intensive lifestyle modifications for his multiple health conditions.  ALLERGIES: No Known Allergies  MEDICATIONS: Current Outpatient Medications on File Prior to Visit  Medication Sig Dispense Refill   metFORMIN (GLUCOPHAGE) 500 MG tablet Take 1 tablet (500 mg total) by mouth 2 (two) times daily with a meal. 60 tablet 0   Vitamin D, Ergocalciferol, (DRISDOL) 1.25 MG (50000 UT) CAPS capsule Take 1 capsule (50,000 Units total) by mouth every 7 (seven) days. 4 capsule 0   No current facility-administered medications on file prior to visit.     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 Karsten Ro   CYSTOSCOPY WITH STENT PLACEMENT Left 02/18/2017   Procedure: CYSTOSCOPY/ LEFT RETROGRADE/ WITH LEFT STENT PLACEMENT;  Surgeon: Kathie Rhodes, MD;  Location: [facility];   Service: Urology;  Laterality: Left;   EXTRACORPOREAL SHOCK WAVE LITHOTRIPSY Left 02/18/2017   Procedure: LEFT EXTRACORPOREAL SHOCK WAVE LITHOTRIPSY (ESWL);  Surgeon: Franchot Gallo, MD;  Location: [facility];  Service: Urology;  Laterality: Left;   EXTRACORPOREAL SHOCK WAVE LITHOTRIPSY Left 04/11/2017   Procedure: LEFT EXTRACORPOREAL SHOCK WAVE LITHOTRIPSY (ESWL);  Surgeon: Irine Seal, MD;  Location: [facility];  Service: Urology;  Laterality: Left;   LITHOTRIPSY     02/18/17   neck bipsy     beningn, in his 26s    SOCIAL HISTORY: Social History   Tobacco Use   Smoking status: Never Smoker   Smokeless tobacco: Never Used  Substance Use Topics   Alcohol use: Yes    Comment: rarely   Drug use: No    FAMILY HISTORY: Family History  Problem Relation Age of Onset   Diabetes Father    COPD Father    Heart disease Father        ?   Stroke Father    Sleep apnea Father    Obesity Father    Heart disease Maternal Grandfather    Heart disease Paternal Grandmother    Diabetes Other        many fam members    Hyperlipidemia Mother        M and others   Cancer Mother    Colon cancer Neg Hx    Prostate cancer Neg Hx    ROS: ROS none reported.  PHYSICAL EXAM: Pt in no acute distress  RECENT LABS AND TESTS: BMET    Component Value Date/Time   NA 142 09/18/2018 0855   K 4.5 09/18/2018 0855   CL 103 09/18/2018 0855   CO2 22 09/18/2018 0855   GLUCOSE 118 (H) 09/18/2018 0855   GLUCOSE 122 (H) 06/24/2018 0809   BUN 16 09/18/2018 0855   CREATININE 1.03 09/18/2018 0855   CALCIUM 9.5 09/18/2018 0855   GFRNONAA 85 09/18/2018 0855   GFRAA 99 09/18/2018 0855   Lab Results  Component Value Date   HGBA1C 6.2 (H) 09/18/2018   HGBA1C 5.5 06/24/2018   HGBA1C 5.6 06/28/2017   Lab Results  Component Value Date   INSULIN 60.2 (H) 09/18/2018   CBC    Component Value Date/Time   WBC 5.4 09/18/2018 0855   WBC 5.6 06/24/2018 0809   RBC 4.81 09/18/2018 0855   RBC  4.76 06/24/2018 0809   HGB 15.1 09/18/2018 0855   HCT 41.5 09/18/2018 0855   PLT 176.0 06/24/2018 0809   MCV 86 09/18/2018 0855   MCH 31.4 09/18/2018 0855   MCH 31.0 02/06/2017 1025   MCHC 36.4 (H) 09/18/2018 0855   MCHC 34.0 06/24/2018 0809   RDW 13.5 09/18/2018 0855   LYMPHSABS 1.6 09/18/2018 0855   MONOABS 0.4 06/24/2018 0809   EOSABS 0.1 09/18/2018 0855   BASOSABS 0.0 09/18/2018 0855   Iron/TIBC/Ferritin/ %Sat No results found for: IRON, TIBC, FERRITIN, IRONPCTSAT Lipid Panel  Component Value Date/Time   CHOL 214 (H) 09/18/2018 0855   TRIG 375 (H) 09/18/2018 0855   HDL 25 (L) 09/18/2018 0855   CHOLHDL 8 06/24/2018 0809   VLDL 66.6 (H) 06/24/2018 0809   LDLCALC 114 (H) 09/18/2018 0855   LDLDIRECT 116.0 06/24/2018 0809   Hepatic Function Panel     Component Value Date/Time   PROT 6.6 09/18/2018 0855   ALBUMIN 4.5 09/18/2018 0855   AST 33 09/18/2018 0855   ALT 47 (H) 09/18/2018 0855   ALKPHOS 80 09/18/2018 0855   BILITOT 0.4 09/18/2018 0855      Component Value Date/Time   TSH 3.960 09/18/2018 0855   TSH 3.15 06/24/2018 0809   TSH 2.25 03/12/2016 1332   Results for SONAM, HUELSMANN (MRN [id number]) as of 01/06/2019 10:05  Ref. Range 09/18/2018 08:55  Vitamin D, 25-Hydroxy Latest Ref Range: 30.0 - 100.0 ng/mL 13.3 (L)    I, Michaelene Song, am acting as Location manager for [employer], FNP  I have reviewed the above documentation for accuracy and completeness, and I agree with the above.  - Cardarius Senat, FNP-C.

## 2019-01-13 ENCOUNTER — Ambulatory Visit: Payer: BC Managed Care – PPO | Admitting: Internal Medicine

## 2019-01-13 ENCOUNTER — Encounter: Payer: Self-pay | Admitting: Internal Medicine

## 2019-01-13 ENCOUNTER — Other Ambulatory Visit: Payer: Self-pay

## 2019-01-13 VITALS — BP 121/72 | HR 63 | Temp 98.1°F | Resp 16 | Ht 70.0 in | Wt 211.2 lb

## 2019-01-13 DIAGNOSIS — Z6834 Body mass index (BMI) 34.0-34.9, adult: Secondary | ICD-10-CM

## 2019-01-13 DIAGNOSIS — R7303 Prediabetes: Secondary | ICD-10-CM

## 2019-01-13 DIAGNOSIS — E6609 Other obesity due to excess calories: Secondary | ICD-10-CM

## 2019-01-13 MED ORDER — BETAMETHASONE DIPROPIONATE AUG 0.05 % EX CREA: TOPICAL_CREAM | Freq: Two times a day (BID) | CUTANEOUS | 0 refills | Status: DC

## 2019-01-13 NOTE — Assessment & Plan Note (Signed)
COVID-19: Patient was diagnosed May 2020, fully recuperated. Morbid obesity: Working diligently with the wellness center, he has lost a significant amount of weight.  Patient is congratulated. Prediabetes: A1c 6.2, follow-up by the wellness center, on metformin. Poison ivy: As described above, case is mild, avoid oral steroids, prescribed potent topical steroids.  Needs to wash all his clothing and call if not improving. Recommend early flu shot this year RTC CPX 6 or 7 months

## 2019-01-13 NOTE — Progress Notes (Signed)
Subjective:    Patient ID: Cameron Valenzuela, male    DOB: 05-30-1971, 48 y.o.   MRN: [id number]  DOS:  01/13/2019 Type of visit - description: Follow-up Last month was diagnosed with COVID-19, he recuperated well. Morbid obesity: Doing great, follow-up by the wellness clinic A week ago worked @ his  yard, subsequently developed itchy rash at the hands on and off and at the flexors area of the elbows and knees.  Wt Readings from Last 3 Encounters:  01/13/19 211 lb 4 oz (95.8 kg)  10/06/18 240 lb (108.9 kg)  09/18/18 256 lb (116.1 kg)     Review of Systems  Currently asymptomatic with no fever chills No difficulty breathing No cough No nausea, vomiting, diarrhea Past Medical History:  Diagnosis Date  . Allergic rhinitis   . Allergy    seasonal  . Family history of adverse reaction to anesthesia    mother slow to wake up   . Fatigue   . History of kidney stones   . Kidney stones   . Knee pain, right   . Lower extremity edema   . Pinched nerve in neck   . Rosacea   . 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 Sleep apnea     Past Surgical History:  Procedure Laterality Date  . Cystocopy     Dr. Karsten Ro  . CYSTOSCOPY WITH STENT PLACEMENT Left 02/18/2017   Procedure: CYSTOSCOPY/ LEFT RETROGRADE/ WITH LEFT STENT PLACEMENT;  Surgeon: Kathie Rhodes, MD;  Location: [facility];  Service: Urology;  Laterality: Left;  . EXTRACORPOREAL SHOCK WAVE LITHOTRIPSY Left 02/18/2017   Procedure: LEFT EXTRACORPOREAL SHOCK WAVE LITHOTRIPSY (ESWL);  Surgeon: Franchot Gallo, MD;  Location: [facility];  Service: Urology;  Laterality: Left;  . EXTRACORPOREAL SHOCK WAVE LITHOTRIPSY Left 04/11/2017   Procedure: LEFT EXTRACORPOREAL SHOCK WAVE LITHOTRIPSY (ESWL);  Surgeon: Irine Seal, MD;  Location: [facility];  Service: Urology;  Laterality: Left;  . LITHOTRIPSY     02/18/17  . neck bipsy     beningn, in his 69s    Social History   Socioeconomic History  . Marital status: Married    Spouse name: Vaughan Basta  . Number of children: 1  . Years of education: Not on file  . Highest education level: Not on file  Occupational History  . 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 Financial resource strain: Not on file  . Food insecurity    Worry: Not on file    Inability: Not on file  . Transportation needs    Medical: Not on file    Non-medical: Not on file  Tobacco Use  . Smoking status: Never Smoker  . Smokeless tobacco: Never Used  Substance and Sexual Activity  . Alcohol use: Yes    Comment: rarely  . Drug use: No  . Sexual activity: Yes  Lifestyle  . Physical activity    Days per week: Not on file    Minutes per session: Not on file  . Stress: Not on file  Relationships  . Social Herbalist on phone: Not on file    Gets together: Not on file    Attends religious service: Not on file    Active member of club or organization: Not on file    Attends meetings of clubs or organizations: Not on file    Relationship status: Not on  file  . Intimate partner violence    Fear of current or ex partner: Not on file    Emotionally abused: Not on file    Physically abused: Not on file    Forced sexual activity: Not on file  Other Topics Concern  . Not on file  Social History Narrative   Household-- pt, wife, child     (mother in law [name], passed away Oct 19, 2015)      Allergies as of 01/13/2019   No Known Allergies     Medication List       Accurate as of January 13, 2019  9:15 PM. If you have any questions, ask your nurse or doctor.        augmented betamethasone dipropionate 0.05 % cream Commonly known as: DIPROLENE-AF Apply topically 2 (two) times daily. Started by: Kathlene November, MD   metFORMIN 500 MG tablet Commonly known as: GLUCOPHAGE Take 1 tablet (500 mg total) by mouth 2 (two) times daily with a meal.   Vitamin D (Ergocalciferol) 1.25 MG (50000 UT) Caps capsule  Commonly known as: DRISDOL Take 1 capsule (50,000 Units total) by mouth every 7 (seven) days.           Objective:   Physical Exam BP 121/72 (BP Location: Left Arm, Patient Position: Sitting, Cuff Size: Small)   Pulse 63   Temp 98.1 F (36.7 C) (Oral)   Resp 16   Ht 5\' 10"  (1.778 m)   Wt 211 lb 4 oz (95.8 kg)   SpO2 99%   BMI 30.31 kg/m   General:   Well developed, NAD, BMI noted. HEENT:  Normocephalic . Face symmetric, atraumatic Lungs:  CTA B Normal respiratory effort, no intercostal retractions, no accessory muscle use. Heart: RRR,  no murmur.  No pretibial edema bilaterally  Skin: Had a couple of spots of mild macular erythema at the left hand the popliteal area, right knee. Neurologic:  alert & oriented X3.  Speech normal, gait appropriate for age and unassisted Psych--  Cognition and judgment appear intact.  Cooperative with normal attention span and concentration.  Behavior appropriate. No anxious or depressed appearing.      Assessment     Assessment Pre diabetes Seasonal allergies Rosacea Kidney stones ([facility]) OSA per sleep October 18, 2016 , has a CPAP  SCC dx 10-18-17  PLAN: COVID-19: Patient was diagnosed May 2020, fully recuperated. Morbid obesity: Working diligently with the wellness center, he has lost a significant amount of weight.  Patient is congratulated. Prediabetes: A1c 6.2, follow-up by the wellness center, on metformin. Poison ivy: As described above, case is mild, avoid oral steroids, prescribed potent topical steroids.  Needs to wash all his clothing and call if not improving. Recommend early flu shot this year RTC CPX 6 or 7 months

## 2019-01-13 NOTE — Patient Instructions (Signed)
  GO TO THE FRONT DESK Schedule your next appointment   For a physical in 6-7 months

## 2019-01-13 NOTE — Progress Notes (Signed)
Pre visit review using our clinic review tool, if applicable. No additional management support is needed unless otherwise documented below in the visit note. 

## 2019-01-21 ENCOUNTER — Encounter (INDEPENDENT_AMBULATORY_CARE_PROVIDER_SITE_OTHER): Payer: Self-pay | Admitting: Physician Assistant

## 2019-01-21 ENCOUNTER — Ambulatory Visit (INDEPENDENT_AMBULATORY_CARE_PROVIDER_SITE_OTHER): Payer: BC Managed Care – PPO | Admitting: Physician Assistant

## 2019-01-21 ENCOUNTER — Other Ambulatory Visit: Payer: Self-pay

## 2019-01-21 VITALS — BP 133/74 | HR 62 | Temp 98.3°F | Ht 70.0 in | Wt 206.0 lb

## 2019-01-21 DIAGNOSIS — E669 Obesity, unspecified: Secondary | ICD-10-CM | POA: Diagnosis not present

## 2019-01-21 DIAGNOSIS — Z6830 Body mass index (BMI) 30.0-30.9, adult: Secondary | ICD-10-CM

## 2019-01-21 DIAGNOSIS — R7303 Prediabetes: Secondary | ICD-10-CM

## 2019-01-21 DIAGNOSIS — E559 Vitamin D deficiency, unspecified: Secondary | ICD-10-CM

## 2019-01-21 DIAGNOSIS — Z9189 Other specified personal risk factors, not elsewhere classified: Secondary | ICD-10-CM | POA: Diagnosis not present

## 2019-01-21 MED ORDER — METFORMIN HCL 500 MG PO TABS: 500.0000 mg | ORAL_TABLET | Freq: Two times a day (BID) | ORAL | 0 refills | Status: DC

## 2019-01-21 NOTE — Progress Notes (Signed)
Office: [phone]  /  Fax: [phone]   HPI:   Chief Complaint: OBESITY Cameron Valenzuela is here to discuss his progress with his obesity treatment plan. He is on the Category 4 plan and is following his eating plan approximately 75 % of the time. He states he is walking 1 mile 3-4 times per week. [place] reports that he has done very well on the plan. He is slightly bored with lunch and would like more options.  His weight is 206 lb (93.4 kg) today and has had a weight loss of 34 pounds over a period of 3 months since his last visit. He has lost 50 lbs since starting treatment with [place].  Pre-Diabetes Cameron Valenzuela has a diagnosis of pre-diabetes based on his elevated Hgb A1c and was informed this puts him at greater risk of developing diabetes. He is taking metformin currently and denies nausea, vomiting, or diarrhea. He continues to work on diet and exercise to decrease risk of diabetes. He denies polyphagia or hypoglycemia.  At risk for diabetes Cameron Valenzuela is at higher than average risk for developing diabetes due to his obesity and pre-diabetes. He currently denies polyuria or polydipsia.  Vitamin D Deficiency Cameron Valenzuela has a diagnosis of vitamin D deficiency. He is currently taking prescription Vit D and denies nausea, vomiting or muscle weakness.  ASSESSMENT AND PLAN:  Prediabetes - Plan: metFORMIN (GLUCOPHAGE) 500 MG tablet  Vitamin D deficiency  At risk for diabetes mellitus  Class 1 obesity with serious comorbidity and body mass index (BMI) of 30.0 to 30.9 in adult, unspecified obesity type - Starting BMI greater then 30  PLAN:  Pre-Diabetes Cameron Valenzuela will continue to work on weight loss, exercise, and decreasing simple carbohydrates in his diet to help decrease the risk of diabetes. We dicussed metformin including benefits and risks. He was informed that eating too many simple carbohydrates or too many calories at one sitting increases the likelihood of GI side effects. Axyl agrees to  continue taking metformin 500 mg PO BID #60 and we will refill for 1 month. [name] agrees to follow up with our clinic in 3 weeks as directed to monitor his progress.  Diabetes risk counseling Cameron Valenzuela was given extended (15 minutes) diabetes prevention counseling today. He is 48 y.o. male and has risk factors for diabetes including obesity and pre-diabetes. We discussed intensive lifestyle modifications today with an emphasis on weight loss as well as increasing exercise and decreasing simple carbohydrates in his diet.  Vitamin D Deficiency Cameron Valenzuela was informed that low vitamin D levels contributes to fatigue and are associated with obesity, breast, and colon cancer. Cameron Valenzuela agrees to continue taking prescription Vit D 50,000 IU every week and will follow up for routine testing of vitamin D, at least 2-3 times per year. He was informed of the risk of over-replacement of vitamin D and agrees to not increase his dose unless he discusses this with [place] first. [name] agrees to follow up with our clinic in 3 weeks.  Obesity Cameron Valenzuela is currently in the action stage of change. As such, his goal is to continue with weight loss efforts He has agreed to follow the Category 4 plan Cameron Valenzuela has been instructed to work up to a goal of 150 minutes of combined cardio and strengthening exercise per week for weight loss and overall health benefits. We discussed the following Behavioral Modification Strategies today: work on meal planning and easy cooking plans and keeping healthy foods in the home   Cameron Valenzuela has agreed to follow up  with our clinic in 3 weeks. He was informed of the importance of frequent follow up visits to maximize his success with intensive lifestyle modifications for his multiple health conditions.  ALLERGIES: No Known Allergies  MEDICATIONS: Current Outpatient Medications on File Prior to Visit  Medication Sig Dispense Refill  . augmented betamethasone dipropionate (DIPROLENE-AF) 0.05 %  cream Apply topically 2 (two) times daily. 60 g 0  . Vitamin D, Ergocalciferol, (DRISDOL) 1.25 MG (50000 UT) CAPS capsule Take 1 capsule (50,000 Units total) by mouth every 7 (seven) days. 4 capsule 0   No current facility-administered medications on file prior to visit.     PAST MEDICAL HISTORY: Past Medical History:  Diagnosis Date  . Allergic rhinitis   . Allergy    seasonal  . Family history of adverse reaction to anesthesia    mother slow to wake up   . Fatigue   . History of kidney stones   . Kidney stones   . Knee pain, right   . Lower extremity edema   . Pinched nerve in neck   . Rosacea   . 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 Sleep apnea     PAST SURGICAL HISTORY: Past Surgical History:  Procedure Laterality Date  . Cystocopy     Dr. Karsten Ro  . CYSTOSCOPY WITH STENT PLACEMENT Left 02/18/2017   Procedure: CYSTOSCOPY/ LEFT RETROGRADE/ WITH LEFT STENT PLACEMENT;  Surgeon: Kathie Rhodes, MD;  Location: [facility];  Service: Urology;  Laterality: Left;  . EXTRACORPOREAL SHOCK WAVE LITHOTRIPSY Left 02/18/2017   Procedure: LEFT EXTRACORPOREAL SHOCK WAVE LITHOTRIPSY (ESWL);  Surgeon: Franchot Gallo, MD;  Location: [facility];  Service: Urology;  Laterality: Left;  . EXTRACORPOREAL SHOCK WAVE LITHOTRIPSY Left 04/11/2017   Procedure: LEFT EXTRACORPOREAL SHOCK WAVE LITHOTRIPSY (ESWL);  Surgeon: Irine Seal, MD;  Location: [facility];  Service: Urology;  Laterality: Left;  . LITHOTRIPSY     02/18/17  . neck bipsy     beningn, in his 10s    SOCIAL HISTORY: Social History   Tobacco Use  . Smoking status: Never Smoker  . Smokeless tobacco: Never Used  Substance Use Topics  . Alcohol use: Yes    Comment: rarely  . Drug use: No    FAMILY HISTORY: Family History  Problem Relation Age of Onset  . Diabetes Father   . COPD Father   . Heart disease Father        ?  . Stroke Father   . Sleep apnea Father    . Obesity Father   . Heart disease Maternal Grandfather   . Heart disease Paternal Grandmother   . Diabetes Other        many fam members   . Hyperlipidemia Mother        M and others  . Cancer Mother   . Colon cancer Neg Hx   . Prostate cancer Neg Hx     ROS: Review of Systems  Constitutional: Positive for weight loss.  Gastrointestinal: Negative for diarrhea, nausea and vomiting.  Genitourinary: Negative for frequency.  Musculoskeletal:       Negative muscle weakness  Endo/Heme/Allergies: Negative for polydipsia.       Negative polyphagia Negative hypoglycemia    PHYSICAL EXAM: Blood pressure 133/74, pulse 62, temperature 98.3 F (36.8 C), temperature source Oral, height 5\' 10"  (1.778 m), weight 206 lb (93.4 kg), SpO2 99 %. Body mass index is  29.56 kg/m. Physical Exam Vitals signs reviewed.  Constitutional:      Appearance: Normal appearance. He is obese.  Cardiovascular:     Rate and Rhythm: Normal rate.     Pulses: Normal pulses.  Pulmonary:     Effort: Pulmonary effort is normal.     Breath sounds: Normal breath sounds.  Musculoskeletal: Normal range of motion.  Skin:    General: Skin is warm and dry.  Neurological:     Mental Status: He is alert and oriented to person, place, and time.  Psychiatric:        Mood and Affect: Mood normal.        Behavior: Behavior normal.     RECENT LABS AND TESTS: BMET    Component Value Date/Time   NA 142 09/18/2018 0855   K 4.5 09/18/2018 0855   CL 103 09/18/2018 0855   CO2 22 09/18/2018 0855   GLUCOSE 118 (H) 09/18/2018 0855   GLUCOSE 122 (H) 06/24/2018 0809   BUN 16 09/18/2018 0855   CREATININE 1.03 09/18/2018 0855   CALCIUM 9.5 09/18/2018 0855   GFRNONAA 85 09/18/2018 0855   GFRAA 99 09/18/2018 0855   Lab Results  Component Value Date   HGBA1C 6.2 (H) 09/18/2018   HGBA1C 5.5 06/24/2018   HGBA1C 5.6 06/28/2017   Lab Results  Component Value Date   INSULIN 60.2 (H) 09/18/2018   CBC    Component  Value Date/Time   WBC 5.4 09/18/2018 0855   WBC 5.6 06/24/2018 0809   RBC 4.81 09/18/2018 0855   RBC 4.76 06/24/2018 0809   HGB 15.1 09/18/2018 0855   HCT 41.5 09/18/2018 0855   PLT 176.0 06/24/2018 0809   MCV 86 09/18/2018 0855   MCH 31.4 09/18/2018 0855   MCH 31.0 02/06/2017 1025   MCHC 36.4 (H) 09/18/2018 0855   MCHC 34.0 06/24/2018 0809   RDW 13.5 09/18/2018 0855   LYMPHSABS 1.6 09/18/2018 0855   MONOABS 0.4 06/24/2018 0809   EOSABS 0.1 09/18/2018 0855   BASOSABS 0.0 09/18/2018 0855   Iron/TIBC/Ferritin/ %Sat No results found for: IRON, TIBC, FERRITIN, IRONPCTSAT Lipid Panel     Component Value Date/Time   CHOL 214 (H) 09/18/2018 0855   TRIG 375 (H) 09/18/2018 0855   HDL 25 (L) 09/18/2018 0855   CHOLHDL 8 06/24/2018 0809   VLDL 66.6 (H) 06/24/2018 0809   LDLCALC 114 (H) 09/18/2018 0855   LDLDIRECT 116.0 06/24/2018 0809   Hepatic Function Panel     Component Value Date/Time   PROT 6.6 09/18/2018 0855   ALBUMIN 4.5 09/18/2018 0855   AST 33 09/18/2018 0855   ALT 47 (H) 09/18/2018 0855   ALKPHOS 80 09/18/2018 0855   BILITOT 0.4 09/18/2018 0855      Component Value Date/Time   TSH 3.960 09/18/2018 0855   TSH 3.15 06/24/2018 0809   TSH 2.25 03/12/2016 1332      OBESITY BEHAVIORAL INTERVENTION VISIT  Today's visit was # 9   Starting weight: 256 lbs Starting date: 09/18/2018 Today's weight : 206 lbs Today's date: 01/21/2019 Total lbs lost to date: 6    ASK: We discussed the diagnosis of obesity with [place] today and Roberta agreed to give [place] permission to discuss obesity behavioral modification therapy today.  ASSESS: Nnamdi has the diagnosis of obesity and his BMI today is 29.56 Rameses is in the action stage of change   ADVISE: Creig was educated on the multiple health risks of obesity as well as the  benefit of weight loss to improve his health. He was advised of the need for long term treatment and the importance of lifestyle  modifications to improve his current health and to decrease his risk of future health problems.  AGREE: Multiple dietary modification options and treatment options were discussed and  Denym agreed to follow the recommendations documented in the above note.  ARRANGE: Luismanuel was educated on the importance of frequent visits to treat obesity as outlined per CMS and USPSTF guidelines and agreed to schedule his next follow up appointment today.  Wilhemena Durie, am acting as transcriptionist for Abby Potash, PA-C I, Abby Potash, PA-C have reviewed above note and agree with its content

## 2019-02-07 ENCOUNTER — Other Ambulatory Visit (INDEPENDENT_AMBULATORY_CARE_PROVIDER_SITE_OTHER): Payer: Self-pay | Admitting: Family Medicine

## 2019-02-07 DIAGNOSIS — E559 Vitamin D deficiency, unspecified: Secondary | ICD-10-CM

## 2019-02-11 ENCOUNTER — Ambulatory Visit (INDEPENDENT_AMBULATORY_CARE_PROVIDER_SITE_OTHER): Payer: BC Managed Care – PPO | Admitting: Bariatrics

## 2019-02-11 ENCOUNTER — Encounter (INDEPENDENT_AMBULATORY_CARE_PROVIDER_SITE_OTHER): Payer: Self-pay | Admitting: Bariatrics

## 2019-02-11 ENCOUNTER — Other Ambulatory Visit: Payer: Self-pay

## 2019-02-11 VITALS — BP 120/72 | HR 69 | Temp 98.5°F | Ht 70.0 in | Wt 205.0 lb

## 2019-02-11 DIAGNOSIS — Z9189 Other specified personal risk factors, not elsewhere classified: Secondary | ICD-10-CM

## 2019-02-11 DIAGNOSIS — Z6830 Body mass index (BMI) 30.0-30.9, adult: Secondary | ICD-10-CM

## 2019-02-11 DIAGNOSIS — R7303 Prediabetes: Secondary | ICD-10-CM | POA: Diagnosis not present

## 2019-02-11 DIAGNOSIS — E669 Obesity, unspecified: Secondary | ICD-10-CM

## 2019-02-11 DIAGNOSIS — E559 Vitamin D deficiency, unspecified: Secondary | ICD-10-CM | POA: Diagnosis not present

## 2019-02-11 NOTE — Progress Notes (Signed)
Office: [phone]  /  Fax: [phone]   HPI:   Chief Complaint: OBESITY Cameron Valenzuela is here to discuss his progress with his obesity treatment plan. He is on the Category 4 plan and is following his eating plan approximately 2-3% of the time. He states he is walking 2 miles 3-4 times per week. Cameron Valenzuela is down 1 lb. He has been on vacation (increased exercise and increased calories). He states he was following the "spirit of the diet." His goal weight is 185 lbs. His weight is 205 lb (93 kg) today and has had a weight loss of 1 pound over a period of 3 weeks since his last visit. He has lost 51 lbs since starting treatment with [place].  Pre-Diabetes Cameron Valenzuela has a diagnosis of prediabetes based on his elevated Hgb A1c and was informed this puts him at greater risk of developing diabetes. His A1c was 6.2 on 09/18/2018 and his insulin was 60.2. He is taking metformin currently and continues to work on diet and exercise to decrease risk of diabetes. He denies nausea or hypoglycemia.  At risk for diabetes Cameron Valenzuela is at higher than average risk for developing diabetes due to his obesity. He currently denies polyuria or polydipsia.  Vitamin D deficiency Cameron Valenzuela has a diagnosis of Vitamin D deficiency. He is currently taking prescription Vit D and denies nausea, vomiting or muscle weakness.  ASSESSMENT AND PLAN:  Prediabetes - Plan: Hemoglobin A1c, Insulin, random, Comprehensive metabolic panel  Vitamin D deficiency - Plan: VITAMIN D 25 Hydroxy (Vit-D Deficiency, Fractures), Lipid Panel With LDL/HDL Ratio  At risk for diabetes mellitus  Class 1 obesity with serious comorbidity and body mass index (BMI) of 30.0 to 30.9 in adult, unspecified obesity type  PLAN:  Pre-Diabetes Cameron Valenzuela will continue to work on weight loss, exercise, and decreasing simple carbohydrates in his diet to help decrease the risk of diabetes. We dicussed metformin including benefits and risks. He was informed that eating  too many simple carbohydrates or too many calories at one sitting increases the likelihood of GI side effects. Yazid will continue metformin and follow-up with [place] as directed to monitor his progress.  Diabetes risk counseling Cameron Valenzuela was given extended (15 minutes) diabetes prevention counseling today. He is 48 y.o. male and has risk factors for diabetes including obesity. We discussed intensive lifestyle modifications today with an emphasis on weight loss as well as increasing exercise and decreasing simple carbohydrates in his diet.  Vitamin D Deficiency Cameron Valenzuela was informed that low Vitamin D levels contributes to fatigue and are associated with obesity, breast, and colon cancer. He agrees to continue taking prescription Vit D and will have routine testing of Vitamin D today. He was informed of the risk of over-replacement of Vitamin D and agrees to not increase his dose unless he discusses this with [place] first. Cameron Valenzuela agrees to follow-up with our clinic in 2 weeks.  Obesity Cameron Valenzuela is currently in the action stage of change. As such, his goal is to continue with weight loss efforts. He has agreed to follow the Category 4 plan. Cameron Valenzuela will work on meal planning, intentional eating, and will have more structure (take his lunch). Cameron Valenzuela has been instructed to continue exercise (get back in the gym) for weight loss and overall health benefits. We discussed the following Behavioral Modification Stratagies today: increasing lean protein intake, decreasing simple carbohydrates, increasing vegetables, increase H20 intake, decrease eating out, no skipping meals, work on meal planning and easy cooking plans, keeping healthy foods in the  home, travel eating strategies, and planning for success.  Cameron Valenzuela has agreed to follow-up with our clinic in 2 weeks. He was informed of the importance of frequent follow-up visits to maximize his success with intensive lifestyle modifications for his multiple health  conditions.  ALLERGIES: No Known Allergies  MEDICATIONS: Current Outpatient Medications on File Prior to Visit  Medication Sig Dispense Refill  . augmented betamethasone dipropionate (DIPROLENE-AF) 0.05 % cream Apply topically 2 (two) times daily. 60 g 0  . metFORMIN (GLUCOPHAGE) 500 MG tablet Take 1 tablet (500 mg total) by mouth 2 (two) times daily with a meal. 60 tablet 0  . Vitamin D, Ergocalciferol, (DRISDOL) 1.25 MG (50000 UT) CAPS capsule Take 1 capsule (50,000 Units total) by mouth every 7 (seven) days. 4 capsule 0   No current facility-administered medications on file prior to visit.     PAST MEDICAL HISTORY: Past Medical History:  Diagnosis Date  . Allergic rhinitis   . Allergy    seasonal  . Family history of adverse reaction to anesthesia    mother slow to wake up   . Fatigue   . History of kidney stones   . Kidney stones   . Knee pain, right   . Lower extremity edema   . Pinched nerve in neck   . Rosacea   . 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 Sleep apnea     PAST SURGICAL HISTORY: Past Surgical History:  Procedure Laterality Date  . Cystocopy     Dr. Karsten Ro  . CYSTOSCOPY WITH STENT PLACEMENT Left 02/18/2017   Procedure: CYSTOSCOPY/ LEFT RETROGRADE/ WITH LEFT STENT PLACEMENT;  Surgeon: Kathie Rhodes, MD;  Location: [facility];  Service: Urology;  Laterality: Left;  . EXTRACORPOREAL SHOCK WAVE LITHOTRIPSY Left 02/18/2017   Procedure: LEFT EXTRACORPOREAL SHOCK WAVE LITHOTRIPSY (ESWL);  Surgeon: Franchot Gallo, MD;  Location: [facility];  Service: Urology;  Laterality: Left;  . EXTRACORPOREAL SHOCK WAVE LITHOTRIPSY Left 04/11/2017   Procedure: LEFT EXTRACORPOREAL SHOCK WAVE LITHOTRIPSY (ESWL);  Surgeon: Irine Seal, MD;  Location: [facility];  Service: Urology;  Laterality: Left;  . LITHOTRIPSY     02/18/17  . neck bipsy     beningn, in his 75s    SOCIAL HISTORY: Social History   Tobacco  Use  . Smoking status: Never Smoker  . Smokeless tobacco: Never Used  Substance Use Topics  . Alcohol use: Yes    Comment: rarely  . Drug use: No    FAMILY HISTORY: Family History  Problem Relation Age of Onset  . Diabetes Father   . COPD Father   . Heart disease Father        ?  . Stroke Father   . Sleep apnea Father   . Obesity Father   . Heart disease Maternal Grandfather   . Heart disease Paternal Grandmother   . Diabetes Other        many fam members   . Hyperlipidemia Mother        M and others  . Cancer Mother   . Colon cancer Neg Hx   . Prostate cancer Neg Hx    ROS: Review of Systems  Gastrointestinal: Negative for nausea and vomiting.  Musculoskeletal:       Negative for muscle weakness.   PHYSICAL EXAM: Blood pressure 120/72, pulse 69, temperature 98.5 F (36.9 C), temperature source Oral, height 5\' 10"  (1.778 m), weight 205  lb (93 kg), SpO2 97 %. Body mass index is 29.41 kg/m. Physical Exam Vitals signs reviewed.  Constitutional:      Appearance: Normal appearance. He is obese.  Cardiovascular:     Rate and Rhythm: Normal rate.     Pulses: Normal pulses.  Pulmonary:     Effort: Pulmonary effort is normal.     Breath sounds: Normal breath sounds.  Musculoskeletal: Normal range of motion.  Skin:    General: Skin is warm and dry.  Neurological:     Mental Status: He is alert and oriented to person, place, and time.  Psychiatric:        Behavior: Behavior normal.   RECENT LABS AND TESTS: BMET    Component Value Date/Time   NA 142 09/18/2018 0855   K 4.5 09/18/2018 0855   CL 103 09/18/2018 0855   CO2 22 09/18/2018 0855   GLUCOSE 118 (H) 09/18/2018 0855   GLUCOSE 122 (H) 06/24/2018 0809   BUN 16 09/18/2018 0855   CREATININE 1.03 09/18/2018 0855   CALCIUM 9.5 09/18/2018 0855   GFRNONAA 85 09/18/2018 0855   GFRAA 99 09/18/2018 0855   Lab Results  Component Value Date   HGBA1C 6.2 (H) 09/18/2018   HGBA1C 5.5 06/24/2018   HGBA1C 5.6  06/28/2017   Lab Results  Component Value Date   INSULIN 60.2 (H) 09/18/2018   CBC    Component Value Date/Time   WBC 5.4 09/18/2018 0855   WBC 5.6 06/24/2018 0809   RBC 4.81 09/18/2018 0855   RBC 4.76 06/24/2018 0809   HGB 15.1 09/18/2018 0855   HCT 41.5 09/18/2018 0855   PLT 176.0 06/24/2018 0809   MCV 86 09/18/2018 0855   MCH 31.4 09/18/2018 0855   MCH 31.0 02/06/2017 1025   MCHC 36.4 (H) 09/18/2018 0855   MCHC 34.0 06/24/2018 0809   RDW 13.5 09/18/2018 0855   LYMPHSABS 1.6 09/18/2018 0855   MONOABS 0.4 06/24/2018 0809   EOSABS 0.1 09/18/2018 0855   BASOSABS 0.0 09/18/2018 0855   Iron/TIBC/Ferritin/ %Sat No results found for: IRON, TIBC, FERRITIN, IRONPCTSAT Lipid Panel     Component Value Date/Time   CHOL 214 (H) 09/18/2018 0855   TRIG 375 (H) 09/18/2018 0855   HDL 25 (L) 09/18/2018 0855   CHOLHDL 8 06/24/2018 0809   VLDL 66.6 (H) 06/24/2018 0809   LDLCALC 114 (H) 09/18/2018 0855   LDLDIRECT 116.0 06/24/2018 0809   Hepatic Function Panel     Component Value Date/Time   PROT 6.6 09/18/2018 0855   ALBUMIN 4.5 09/18/2018 0855   AST 33 09/18/2018 0855   ALT 47 (H) 09/18/2018 0855   ALKPHOS 80 09/18/2018 0855   BILITOT 0.4 09/18/2018 0855      Component Value Date/Time   TSH 3.960 09/18/2018 0855   TSH 3.15 06/24/2018 0809   TSH 2.25 03/12/2016 1332   Results for KAVION, MANCINAS (MRN [id number]) as of 02/11/2019 11:12  Ref. Range 09/18/2018 08:55  Vitamin D, 25-Hydroxy Latest Ref Range: 30.0 - 100.0 ng/mL 13.3 (L)   OBESITY BEHAVIORAL INTERVENTION VISIT  Today's visit was #10   Starting weight: 256 lbs Starting date: 09/18/2018 Today's weight: 205 lbs  Today's date: 02/11/2019 Total lbs lost to date: 51   02/11/2019  Height 5\' 10"  (1.778 m)  Weight 205 lb (93 kg)  BMI (Calculated) 29.41  BLOOD PRESSURE - SYSTOLIC 409  BLOOD PRESSURE - DIASTOLIC 72   Body Fat % 81.1 %  Total Body Water (lbs) 103.8 lbs  ASK: We discussed the diagnosis of  obesity with Ilene Qua Greenberger today and Jomo agreed to give [place] permission to discuss obesity behavioral modification therapy today.  ASSESS: Finnis has the diagnosis of obesity and his BMI today is 29.4. Sukhdeep is in the action stage of change.  ADVISE: Murat was educated on the multiple health risks of obesity as well as the benefit of weight loss to improve his health. He was advised of the need for long term treatment and the importance of lifestyle modifications to improve his current health and to decrease his risk of future health problems.  AGREE: Multiple dietary modification options and treatment options were discussed and  Kalum agreed to follow the recommendations documented in the above note.  ARRANGE: Joseangel was educated on the importance of frequent visits to treat obesity as outlined per CMS and USPSTF guidelines and agreed to schedule his next follow up appointment today.  Migdalia Dk, am acting as Location manager for [employer], DO  I have reviewed the above documentation for accuracy and completeness, and I agree with the above. -Jearld Lesch, DO

## 2019-02-12 LAB — COMPREHENSIVE METABOLIC PANEL
ALT: 30 IU/L (ref 0–44)
AST: 19 IU/L (ref 0–40)
Albumin/Globulin Ratio: 2.1 (ref 1.2–2.2)
Albumin: 4.7 g/dL (ref 4.0–5.0)
Alkaline Phosphatase: 67 IU/L (ref 39–117)
BUN/Creatinine Ratio: 22 — ABNORMAL HIGH (ref 9–20)
BUN: 23 mg/dL (ref 6–24)
Bilirubin Total: 0.5 mg/dL (ref 0.0–1.2)
CO2: 23 mmol/L (ref 20–29)
Calcium: 9.2 mg/dL (ref 8.7–10.2)
Chloride: 103 mmol/L (ref 96–106)
Creatinine, Ser: 1.03 mg/dL (ref 0.76–1.27)
GFR calc Af Amer: 98 mL/min/{1.73_m2} (ref >59)
GFR calc non Af Amer: 85 mL/min/{1.73_m2} (ref >59)
Globulin, Total: 2.2 g/dL (ref 1.5–4.5)
Glucose: 97 mg/dL (ref 65–99)
Potassium: 4.5 mmol/L (ref 3.5–5.2)
Sodium: 144 mmol/L (ref 134–144)
Total Protein: 6.9 g/dL (ref 6.0–8.5)

## 2019-02-12 LAB — VITAMIN D 25 HYDROXY (VIT D DEFICIENCY, FRACTURES): Vit D, 25-Hydroxy: 49.1 ng/mL (ref 30.0–100.0)

## 2019-02-12 LAB — LIPID PANEL WITH LDL/HDL RATIO
Cholesterol, Total: 222 mg/dL — ABNORMAL HIGH (ref 100–199)
HDL: 33 mg/dL — ABNORMAL LOW (ref >39)
LDL Calculated: 149 mg/dL — ABNORMAL HIGH (ref 0–99)
LDl/HDL Ratio: 4.5 ratio — ABNORMAL HIGH (ref 0.0–3.6)
Triglycerides: 199 mg/dL — ABNORMAL HIGH (ref 0–149)
VLDL Cholesterol Cal: 40 mg/dL (ref 5–40)

## 2019-02-12 LAB — HEMOGLOBIN A1C
Est. average glucose Bld gHb Est-mCnc: 97 mg/dL
Hgb A1c MFr Bld: 5 % (ref 4.8–5.6)

## 2019-02-12 LAB — INSULIN, RANDOM: INSULIN: 16.4 u[IU]/mL (ref 2.6–24.9)

## 2019-02-16 ENCOUNTER — Other Ambulatory Visit (INDEPENDENT_AMBULATORY_CARE_PROVIDER_SITE_OTHER): Payer: Self-pay | Admitting: Physician Assistant

## 2019-02-16 DIAGNOSIS — R7303 Prediabetes: Secondary | ICD-10-CM

## 2019-02-26 ENCOUNTER — Other Ambulatory Visit: Payer: Self-pay

## 2019-02-26 ENCOUNTER — Ambulatory Visit (INDEPENDENT_AMBULATORY_CARE_PROVIDER_SITE_OTHER): Payer: BC Managed Care – PPO | Admitting: Bariatrics

## 2019-02-26 ENCOUNTER — Encounter (INDEPENDENT_AMBULATORY_CARE_PROVIDER_SITE_OTHER): Payer: Self-pay | Admitting: Bariatrics

## 2019-02-26 VITALS — BP 108/73 | HR 69 | Temp 98.6°F | Ht 70.0 in | Wt 205.0 lb

## 2019-02-26 DIAGNOSIS — Z6830 Body mass index (BMI) 30.0-30.9, adult: Secondary | ICD-10-CM

## 2019-02-26 DIAGNOSIS — R7303 Prediabetes: Secondary | ICD-10-CM | POA: Diagnosis not present

## 2019-02-26 DIAGNOSIS — Z9189 Other specified personal risk factors, not elsewhere classified: Secondary | ICD-10-CM | POA: Diagnosis not present

## 2019-02-26 DIAGNOSIS — E669 Obesity, unspecified: Secondary | ICD-10-CM

## 2019-02-26 DIAGNOSIS — E559 Vitamin D deficiency, unspecified: Secondary | ICD-10-CM | POA: Diagnosis not present

## 2019-02-26 MED ORDER — VITAMIN D (ERGOCALCIFEROL) 1.25 MG (50000 UNIT) PO CAPS: 50000.0000 [IU] | ORAL_CAPSULE | ORAL | 0 refills | Status: DC

## 2019-03-02 NOTE — Progress Notes (Signed)
Office: [phone]  /  Fax: [phone]   HPI:   Chief Complaint: OBESITY Cameron Valenzuela is here to discuss his progress with his obesity treatment plan. He is on the  follow the Category 4 plan and is following his eating plan approximately 50 % of the time. He states he is walking 1 mile 3 to 4 times per week. Cameron Valenzuela has stayed the same, but he is doing better overall. He is not drinking enough water. His weight is 205 lb (93 kg) today and he has maintained weight since his last visit. He has lost 51 lbs since starting treatment with [place].  Vitamin D deficiency Cameron Valenzuela has a diagnosis of vitamin D deficiency. He is currently taking vit D and denies nausea, vomiting or muscle weakness.  At risk for osteopenia and osteoporosis Cameron Valenzuela is at higher risk of osteopenia and osteoporosis due to vitamin D deficiency.   Pre-Diabetes Cameron Valenzuela has a diagnosis of prediabetes based on his elevated Hgb A1c and was informed this puts him at greater risk of developing diabetes. His last A1c was at 5.0 and last insulin level was at 16.4 He is taking metformin currently and continues to work on diet and exercise to decrease risk of diabetes. He denies polyphagia.  ASSESSMENT AND PLAN:  Vitamin D deficiency - Plan: Vitamin D, Ergocalciferol, (DRISDOL) 1.25 MG (50000 UT) CAPS capsule  Prediabetes  At risk for osteoporosis  Class 1 obesity with serious comorbidity and body mass index (BMI) of 30.0 to 30.9 in adult, unspecified obesity type  PLAN:  Vitamin D Deficiency Meer was informed that low vitamin D levels contributes to fatigue and are associated with obesity, breast, and colon cancer. He agrees to continue to take prescription Vit D @50 ,000 IU and change to every 2 weeks #2 with no refills and will follow up for routine testing of vitamin D, at least 2-3 times per year. He was informed of the risk of over-replacement of vitamin D and agrees to not increase his dose unless he discusses this with  [place] first. Tyrice agrees to follow up as directed.  At risk for osteopenia and osteoporosis Cameron Valenzuela was given extended  (15 minutes) osteoporosis prevention counseling today. Cameron Valenzuela is at risk for osteopenia and osteoporosis due to his vitamin D deficiency. He was encouraged to take his vitamin D and follow his higher calcium diet and increase strengthening exercise to help strengthen his bones and decrease his risk of osteopenia and osteoporosis.  Pre-Diabetes Cameron Valenzuela will continue to work on weight loss, increasing exercise, increasing lean protein and decreasing simple carbohydrates in his diet to help decrease the risk of diabetes. We dicussed metformin including benefits and risks. He was informed that eating too many simple carbohydrates or too many calories at one sitting increases the likelihood of GI side effects. Cameron Valenzuela will metformin for now and a prescription was not written today. Cameron Valenzuela agreed to follow up with [place] as directed to monitor his progress.  Obesity Cameron Valenzuela is currently in the action stage of change. As such, his goal is to continue with weight loss efforts He has agreed to follow the Category 4 plan Cameron Valenzuela has been instructed to work up to a goal of 150 minutes of combined cardio and strengthening exercise per week for weight loss and overall health benefits. We discussed the following Behavioral Modification Strategies today: increase H2O intake, no skipping meals, keeping healthy foods in the home, increasing lean protein intake, decreasing simple carbohydrates, increasing vegetables, decrease eating out and work on meal  planning and intentional eating  Cameron Valenzuela has agreed to follow up with our clinic in 2 weeks. He was informed of the importance of frequent follow up visits to maximize his success with intensive lifestyle modifications for his multiple health conditions.  ALLERGIES: No Known Allergies  MEDICATIONS: Current Outpatient Medications on File Prior to  Visit  Medication Sig Dispense Refill  . augmented betamethasone dipropionate (DIPROLENE-AF) 0.05 % cream Apply topically 2 (two) times daily. 60 g 0  . metFORMIN (GLUCOPHAGE) 500 MG tablet TAKE 1 TABLET (500 MG TOTAL) BY MOUTH 2 (TWO) TIMES DAILY WITH A MEAL. 60 tablet 0   No current facility-administered medications on file prior to visit.     PAST MEDICAL HISTORY: Past Medical History:  Diagnosis Date  . Allergic rhinitis   . Allergy    seasonal  . Family history of adverse reaction to anesthesia    mother slow to wake up   . Fatigue   . History of kidney stones   . Kidney stones   . Knee pain, right   . Lower extremity edema   . Pinched nerve in neck   . Rosacea   . 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 Sleep apnea     PAST SURGICAL HISTORY: Past Surgical History:  Procedure Laterality Date  . Cystocopy     Dr. Karsten Ro  . CYSTOSCOPY WITH STENT PLACEMENT Left 02/18/2017   Procedure: CYSTOSCOPY/ LEFT RETROGRADE/ WITH LEFT STENT PLACEMENT;  Surgeon: Kathie Rhodes, MD;  Location: [facility];  Service: Urology;  Laterality: Left;  . EXTRACORPOREAL SHOCK WAVE LITHOTRIPSY Left 02/18/2017   Procedure: LEFT EXTRACORPOREAL SHOCK WAVE LITHOTRIPSY (ESWL);  Surgeon: Franchot Gallo, MD;  Location: [facility];  Service: Urology;  Laterality: Left;  . EXTRACORPOREAL SHOCK WAVE LITHOTRIPSY Left 04/11/2017   Procedure: LEFT EXTRACORPOREAL SHOCK WAVE LITHOTRIPSY (ESWL);  Surgeon: Irine Seal, MD;  Location: [facility];  Service: Urology;  Laterality: Left;  . LITHOTRIPSY     02/18/17  . neck bipsy     beningn, in his 65s    SOCIAL HISTORY: Social History   Tobacco Use  . Smoking status: Never Smoker  . Smokeless tobacco: Never Used  Substance Use Topics  . Alcohol use: Yes    Comment: rarely  . Drug use: No    FAMILY HISTORY: Family History  Problem Relation Age of Onset  . Diabetes Father   . COPD Father   .  Heart disease Father        ?  . Stroke Father   . Sleep apnea Father   . Obesity Father   . Heart disease Maternal Grandfather   . Heart disease Paternal Grandmother   . Diabetes Other        many fam members   . Hyperlipidemia Mother        M and others  . Cancer Mother   . Colon cancer Neg Hx   . Prostate cancer Neg Hx     ROS: Review of Systems  Constitutional: Negative for weight loss.  Gastrointestinal: Negative for nausea and vomiting.  Musculoskeletal:       Negative for muscle weakness  Endo/Heme/Allergies:       Negative for polyphagia    PHYSICAL EXAM: Blood pressure 108/73, pulse 69, temperature 98.6 F (37 C), temperature source Oral, height 5\' 10"  (1.778 m), weight 205 lb (93 kg), SpO2 100 %. Body mass index is  29.41 kg/m. Physical Exam  RECENT LABS AND TESTS: BMET    Component Value Date/Time   NA 144 02/11/2019 1426   K 4.5 02/11/2019 1426   CL 103 02/11/2019 1426   CO2 23 02/11/2019 1426   GLUCOSE 97 02/11/2019 1426   GLUCOSE 122 (H) 06/24/2018 0809   BUN 23 02/11/2019 1426   CREATININE 1.03 02/11/2019 1426   CALCIUM 9.2 02/11/2019 1426   GFRNONAA 85 02/11/2019 1426   GFRAA 98 02/11/2019 1426   Lab Results  Component Value Date   HGBA1C 5.0 02/11/2019   HGBA1C 6.2 (H) 09/18/2018   HGBA1C 5.5 06/24/2018   HGBA1C 5.6 06/28/2017   Lab Results  Component Value Date   INSULIN 16.4 02/11/2019   INSULIN 60.2 (H) 09/18/2018   CBC    Component Value Date/Time   WBC 5.4 09/18/2018 0855   WBC 5.6 06/24/2018 0809   RBC 4.81 09/18/2018 0855   RBC 4.76 06/24/2018 0809   HGB 15.1 09/18/2018 0855   HCT 41.5 09/18/2018 0855   PLT 176.0 06/24/2018 0809   MCV 86 09/18/2018 0855   MCH 31.4 09/18/2018 0855   MCH 31.0 02/06/2017 1025   MCHC 36.4 (H) 09/18/2018 0855   MCHC 34.0 06/24/2018 0809   RDW 13.5 09/18/2018 0855   LYMPHSABS 1.6 09/18/2018 0855   MONOABS 0.4 06/24/2018 0809   EOSABS 0.1 09/18/2018 0855   BASOSABS 0.0 09/18/2018 0855    Iron/TIBC/Ferritin/ %Sat No results found for: IRON, TIBC, FERRITIN, IRONPCTSAT Lipid Panel     Component Value Date/Time   CHOL 222 (H) 02/11/2019 1426   TRIG 199 (H) 02/11/2019 1426   HDL 33 (L) 02/11/2019 1426   CHOLHDL 8 06/24/2018 0809   VLDL 66.6 (H) 06/24/2018 0809   LDLCALC 149 (H) 02/11/2019 1426   LDLDIRECT 116.0 06/24/2018 0809   Hepatic Function Panel     Component Value Date/Time   PROT 6.9 02/11/2019 1426   ALBUMIN 4.7 02/11/2019 1426   AST 19 02/11/2019 1426   ALT 30 02/11/2019 1426   ALKPHOS 67 02/11/2019 1426   BILITOT 0.5 02/11/2019 1426      Component Value Date/Time   TSH 3.960 09/18/2018 0855   TSH 3.15 06/24/2018 0809   TSH 2.25 03/12/2016 1332     Ref. Range 02/11/2019 14:26  Vitamin D, 25-Hydroxy Latest Ref Range: 30.0 - 100.0 ng/mL 49.1    OBESITY BEHAVIORAL INTERVENTION VISIT  Today's visit was # 11   Starting weight: 256 lbs Starting date: 09/18/2018 Today's weight : 205 lbs  Today's date: 02/26/2019 Total lbs lost to date: 51    02/26/2019  Height 5\' 10"  (1.778 m)  Weight 205 lb (93 kg)  BMI (Calculated) 29.41  BLOOD PRESSURE - SYSTOLIC 956  BLOOD PRESSURE - DIASTOLIC 73   Body Fat % 25 %  Total Body Water (lbs) 104.4 lbs    ASK: We discussed the diagnosis of obesity with Cameron Valenzuela Tant today and Ervine agreed to give [place] permission to discuss obesity behavioral modification therapy today.  ASSESS: Kweli has the diagnosis of obesity and his BMI today is 29.41 Jarion is in the action stage of change   ADVISE: Lourdes was educated on the multiple health risks of obesity as well as the benefit of weight loss to improve his health. He was advised of the need for long term treatment and the importance of lifestyle modifications to improve his current health and to decrease his risk of future health problems.  AGREE: Multiple dietary modification  options and treatment options were discussed and  Leigh agreed to follow the  recommendations documented in the above note.  ARRANGE: Ethelbert was educated on the importance of frequent visits to treat obesity as outlined per CMS and USPSTF guidelines and agreed to schedule his next follow up appointment today.  Corey Skains, am acting as Location manager for [employer]. Owens Shark, DO  I have reviewed the above documentation for accuracy and completeness, and I agree with the above. -Jearld Lesch, DO

## 2019-03-11 ENCOUNTER — Other Ambulatory Visit: Payer: Self-pay

## 2019-03-11 ENCOUNTER — Encounter (INDEPENDENT_AMBULATORY_CARE_PROVIDER_SITE_OTHER): Payer: Self-pay | Admitting: Family Medicine

## 2019-03-11 ENCOUNTER — Ambulatory Visit (INDEPENDENT_AMBULATORY_CARE_PROVIDER_SITE_OTHER): Payer: BC Managed Care – PPO | Admitting: Family Medicine

## 2019-03-11 VITALS — BP 104/71 | HR 74 | Temp 97.8°F | Ht 70.0 in | Wt 205.0 lb

## 2019-03-11 DIAGNOSIS — E8881 Metabolic syndrome: Secondary | ICD-10-CM | POA: Diagnosis not present

## 2019-03-11 DIAGNOSIS — Z9189 Other specified personal risk factors, not elsewhere classified: Secondary | ICD-10-CM

## 2019-03-11 DIAGNOSIS — E88819 Insulin resistance, unspecified: Secondary | ICD-10-CM

## 2019-03-11 DIAGNOSIS — F3289 Other specified depressive episodes: Secondary | ICD-10-CM

## 2019-03-11 DIAGNOSIS — E669 Obesity, unspecified: Secondary | ICD-10-CM | POA: Diagnosis not present

## 2019-03-11 DIAGNOSIS — Z6830 Body mass index (BMI) 30.0-30.9, adult: Secondary | ICD-10-CM

## 2019-03-11 MED ORDER — BUPROPION HCL ER (SR) 150 MG PO TB12: 150.0000 mg | ORAL_TABLET | Freq: Every day | ORAL | 0 refills | Status: DC

## 2019-03-11 NOTE — Progress Notes (Signed)
Office: [phone]  /  Fax: [phone]   HPI:   Chief Complaint: OBESITY Cameron Valenzuela is here to discuss his progress with his obesity treatment plan. He is on the Category 4 plan and is following his eating plan approximately 50% of the time. He states he is walking 1 mile 20 minutes 5 times per week. Cameron Valenzuela is struggling with temptations at dinner and before bed. He states he is eating all of the protein on the plan.  His weight is 205 lb (93 kg) today and has not lost weight since his last visit. He has lost 51 lbs since starting treatment with [place].  Depression with emotional eating behaviors Cameron Valenzuela is struggling with emotional eating and using food for comfort to the extent that it is negatively impacting his health. He often snacks when he is not hungry in the evening. Cameron Valenzuela sometimes feels he is out of control and then feels guilty that he made poor food choices. He has been working on behavior modification techniques to help reduce his emotional eating and has been somewhat successful. Cameron Valenzuela denies depression but does admit to stress eating and snacking. He shows no sign of suicidal or homicidal ideations.  Depression screen [facility] 2/9 09/18/2018 04/15/2018 04/12/2017 03/12/2016 02/14/2015  Decreased Interest 1 0 0 0 0  Down, Depressed, Hopeless 1 0 0 0 0  PHQ - 2 Score 2 0 0 0 0  Altered sleeping 3 - - - -  Tired, decreased energy 3 - - - -  Change in appetite 1 - - - -  Feeling bad or failure about yourself  1 - - - -  Trouble concentrating 0 - - - -  Moving slowly or fidgety/restless 3 - - - -  Suicidal thoughts 0 - - - -  PHQ-9 Score 13 - - - -  Difficult doing work/chores Somewhat difficult - - - -   Insulin Resistance Cameron Valenzuela has a diagnosis of insulin resistance based on his elevated fasting insulin level >5. He was previously prediabetic.Although Cameron Valenzuela blood glucose readings are still under good control, insulin resistance puts him at greater risk of metabolic syndrome  and diabetes. He is taking metformin currently and continues to work on diet and exercise to decrease risk of diabetes. No polyphagia. Lab Results  Component Value Date   HGBA1C 5.0 02/11/2019     At risk for diabetes Cameron Valenzuela is at higher than average risk for developing diabetes due to his obesity. He currently denies polyuria or polydipsia.  ASSESSMENT AND PLAN:  Other depression - with emotional eating - Plan: buPROPion (WELLBUTRIN SR) 150 MG 12 hr tablet  Insulin resistance  At risk for diabetes mellitus  Class 1 obesity with serious comorbidity and body mass index (BMI) of 30.0 to 30.9 in adult, unspecified obesity type - Starting BMI greater then 30  PLAN:  Depression with Emotional Eating Behaviors We discussed behavior modification techniques today to help Cameron Valenzuela deal with his emotional eating and depression. Cameron Valenzuela was given a new prescription for bupropion SR 150 mg QAM #30 with 0 refills. He agrees to follow-up with our clinic in 2 weeks.  Insulin Resistance Cameron Valenzuela will continue to work on weight loss, exercise, and decreasing simple carbohydrates in his diet to help decrease the risk of diabetes. We dicussed metformin including benefits and risks. He was informed that eating too many simple carbohydrates or too many calories at one sitting increases the likelihood of GI side effects. Cameron Valenzuela will continue metformin and follow-up with [place] as  directed to monitor his progress.  Diabetes risk counseling Cameron Valenzuela was given extended (15 minutes) diabetes prevention counseling today. He is 48 y.o. male and has risk factors for diabetes including obesity. We discussed intensive lifestyle modifications today with an emphasis on weight loss as well as increasing exercise and decreasing simple carbohydrates in his diet.  Obesity Cameron Valenzuela is currently in the action stage of change. As such, his goal is to continue with weight loss efforts. He has agreed to follow the Category 4 plan  and journal 550-700 calories and 45+ grams of protein at supper. Cameron Valenzuela was provided bands and Exercise handout. Handouts were given on Journaling, Protein Content, and Dining Out. Cameron Valenzuela has been instructed to add resistance bands 2 times per week for weight loss and overall health benefits. We discussed the following Behavioral Modification Strategies today: decreasing simple carbohydrates, planning for success and keep a strict food journal.  Cameron Valenzuela has agreed to follow-up with our clinic in 2 weeks. He was informed of the importance of frequent follow-up visits to maximize his success with intensive lifestyle modifications for his multiple health conditions.  ALLERGIES: No Known Allergies  MEDICATIONS: Current Outpatient Medications on File Prior to Visit  Medication Sig Dispense Refill   augmented betamethasone dipropionate (DIPROLENE-AF) 0.05 % cream Apply topically 2 (two) times daily. 60 g 0   metFORMIN (GLUCOPHAGE) 500 MG tablet TAKE 1 TABLET (500 MG TOTAL) BY MOUTH 2 (TWO) TIMES DAILY WITH A MEAL. 60 tablet 0   Vitamin D, Ergocalciferol, (DRISDOL) 1.25 MG (50000 UT) CAPS capsule Take 1 capsule (50,000 Units total) by mouth every 14 (fourteen) days. 2 capsule 0   No current facility-administered medications on file prior to visit.     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 Karsten Ro   CYSTOSCOPY WITH STENT PLACEMENT Left 02/18/2017   Procedure:  CYSTOSCOPY/ LEFT RETROGRADE/ WITH LEFT STENT PLACEMENT;  Surgeon: Kathie Rhodes, MD;  Location: [facility];  Service: Urology;  Laterality: Left;   EXTRACORPOREAL SHOCK WAVE LITHOTRIPSY Left 02/18/2017   Procedure: LEFT EXTRACORPOREAL SHOCK WAVE LITHOTRIPSY (ESWL);  Surgeon: Franchot Gallo, MD;  Location: [facility];  Service: Urology;  Laterality: Left;   EXTRACORPOREAL SHOCK WAVE LITHOTRIPSY Left 04/11/2017   Procedure: LEFT EXTRACORPOREAL SHOCK WAVE LITHOTRIPSY (ESWL);  Surgeon: Irine Seal, MD;  Location: [facility];  Service: Urology;  Laterality: Left;   LITHOTRIPSY     02/18/17   neck bipsy     beningn, in his 41s    SOCIAL HISTORY: Social History   Tobacco Use   Smoking status: Never Smoker   Smokeless tobacco: Never Used  Substance Use Topics   Alcohol use: Yes    Comment: rarely   Drug use: No    FAMILY HISTORY: Family History  Problem Relation Age of Onset   Diabetes Father  COPD Father    Heart disease Father        ?   Stroke Father    Sleep apnea Father    Obesity Father    Heart disease Maternal Grandfather    Heart disease Paternal Grandmother    Diabetes Other        many fam members    Hyperlipidemia Mother        M and others   Cancer Mother    Colon cancer Neg Hx    Prostate cancer Neg Hx    ROS: Review of Systems  Endo/Heme/Allergies:       Negative for polyphagia.  Psychiatric/Behavioral: Positive for depression (emotional eating). Negative for suicidal ideas.       Negative for homicidal ideas.   PHYSICAL EXAM: Blood pressure 104/71, pulse 74, temperature 97.8 F (36.6 C), temperature source Oral, height 5\' 10"  (1.778 m), weight 205 lb (93 kg), SpO2 100 %. Body mass index is 29.41 kg/m. Physical Exam Vitals signs reviewed.  Constitutional:      Appearance: Normal appearance. He is obese.  Cardiovascular:     Rate and Rhythm: Normal rate.     Pulses: Normal pulses.  Pulmonary:     Effort: Pulmonary effort is normal.      Breath sounds: Normal breath sounds.  Musculoskeletal: Normal Valenzuela of motion.  Skin:    General: Skin is warm and dry.  Neurological:     Mental Status: He is alert and oriented to person, place, and time.  Psychiatric:        Behavior: Behavior normal.   RECENT LABS AND TESTS: BMET    Component Value Date/Time   NA 144 02/11/2019 1426   K 4.5 02/11/2019 1426   CL 103 02/11/2019 1426   CO2 23 02/11/2019 1426   GLUCOSE 97 02/11/2019 1426   GLUCOSE 122 (H) 06/24/2018 0809   BUN 23 02/11/2019 1426   CREATININE 1.03 02/11/2019 1426   CALCIUM 9.2 02/11/2019 1426   GFRNONAA 85 02/11/2019 1426   GFRAA 98 02/11/2019 1426   Lab Results  Component Value Date   HGBA1C 5.0 02/11/2019   HGBA1C 6.2 (H) 09/18/2018   HGBA1C 5.5 06/24/2018   HGBA1C 5.6 06/28/2017   Lab Results  Component Value Date   INSULIN 16.4 02/11/2019   INSULIN 60.2 (H) 09/18/2018   CBC    Component Value Date/Time   WBC 5.4 09/18/2018 0855   WBC 5.6 06/24/2018 0809   RBC 4.81 09/18/2018 0855   RBC 4.76 06/24/2018 0809   HGB 15.1 09/18/2018 0855   HCT 41.5 09/18/2018 0855   PLT 176.0 06/24/2018 0809   MCV 86 09/18/2018 0855   MCH 31.4 09/18/2018 0855   MCH 31.0 02/06/2017 1025   MCHC 36.4 (H) 09/18/2018 0855   MCHC 34.0 06/24/2018 0809   RDW 13.5 09/18/2018 0855   LYMPHSABS 1.6 09/18/2018 0855   MONOABS 0.4 06/24/2018 0809   EOSABS 0.1 09/18/2018 0855   BASOSABS 0.0 09/18/2018 0855   Iron/TIBC/Ferritin/ %Sat No results found for: IRON, TIBC, FERRITIN, IRONPCTSAT Lipid Panel     Component Value Date/Time   CHOL 222 (H) 02/11/2019 1426   TRIG 199 (H) 02/11/2019 1426   HDL 33 (L) 02/11/2019 1426   CHOLHDL 8 06/24/2018 0809   VLDL 66.6 (H) 06/24/2018 0809   LDLCALC 149 (H) 02/11/2019 1426   LDLDIRECT 116.0 06/24/2018 0809   Hepatic Function Panel     Component Value Date/Time   PROT 6.9 02/11/2019 1426  ALBUMIN 4.7 02/11/2019 1426   AST 19 02/11/2019 1426   ALT 30 02/11/2019 1426    ALKPHOS 67 02/11/2019 1426   BILITOT 0.5 02/11/2019 1426      Component Value Date/Time   TSH 3.960 09/18/2018 0855   TSH 3.15 06/24/2018 0809   TSH 2.25 03/12/2016 1332   Results for MUNG, HOUGE (MRN [id number]) as of 03/11/2019 11:22  Ref. Valenzuela 02/11/2019 14:26  Vitamin D, 25-Hydroxy Latest Ref Valenzuela: 30.0 - 100.0 ng/mL 49.1   OBESITY BEHAVIORAL INTERVENTION VISIT  Today's visit was #12  Starting weight: 256 lbs Starting date: 09/18/2018 Today's weight: 205 lbs  Today's date: 03/11/2019 Total lbs lost to date: 51    03/11/2019  Height 5\' 10"  (1.778 m)  Weight 205 lb (93 kg)  BMI (Calculated) 29.41  BLOOD PRESSURE - SYSTOLIC [id number]  BLOOD PRESSURE - DIASTOLIC 71   Body Fat % [id number] %  Total Body Water (lbs) 106 lbs   ASK: We discussed the diagnosis of obesity with Cameron Valenzuela today and Cameron Valenzuela agreed to give [place] permission to discuss obesity behavioral modification therapy today.  ASSESS: Cameron Valenzuela has the diagnosis of obesity and his BMI today is 29.4. Cameron Valenzuela is in the action stage of change.   ADVISE: Castin was educated on the multiple health risks of obesity as well as the benefit of weight loss to improve his health. He was advised of the need for long term treatment and the importance of lifestyle modifications to improve his current health and to decrease his risk of future health problems.  AGREE: Multiple dietary modification options and treatment options were discussed and  Cameron Valenzuela agreed to follow the recommendations documented in the above note.  ARRANGE: Cameron Valenzuela was educated on the importance of frequent visits to treat obesity as outlined per CMS and USPSTF guidelines and agreed to schedule his next follow up appointment today.  IMichaelene Song, am acting as Location manager for [employer], FNP  I have reviewed the above documentation for accuracy and completeness, and I agree with the above.  - Enrrique Mierzwa, FNP-C.

## 2019-03-14 ENCOUNTER — Other Ambulatory Visit (INDEPENDENT_AMBULATORY_CARE_PROVIDER_SITE_OTHER): Payer: Self-pay | Admitting: Bariatrics

## 2019-03-14 DIAGNOSIS — R7303 Prediabetes: Secondary | ICD-10-CM

## 2019-03-25 ENCOUNTER — Ambulatory Visit (INDEPENDENT_AMBULATORY_CARE_PROVIDER_SITE_OTHER): Payer: BC Managed Care – PPO | Admitting: Family Medicine

## 2019-03-25 ENCOUNTER — Other Ambulatory Visit: Payer: Self-pay

## 2019-03-25 VITALS — BP 112/72 | HR 76 | Temp 98.0°F | Ht 70.0 in | Wt 205.8 lb

## 2019-03-25 DIAGNOSIS — E8881 Metabolic syndrome: Secondary | ICD-10-CM | POA: Diagnosis not present

## 2019-03-25 DIAGNOSIS — E669 Obesity, unspecified: Secondary | ICD-10-CM

## 2019-03-25 DIAGNOSIS — Z9189 Other specified personal risk factors, not elsewhere classified: Secondary | ICD-10-CM | POA: Diagnosis not present

## 2019-03-25 DIAGNOSIS — E559 Vitamin D deficiency, unspecified: Secondary | ICD-10-CM | POA: Diagnosis not present

## 2019-03-25 DIAGNOSIS — E88819 Insulin resistance, unspecified: Secondary | ICD-10-CM

## 2019-03-25 DIAGNOSIS — Z6830 Body mass index (BMI) 30.0-30.9, adult: Secondary | ICD-10-CM

## 2019-03-25 DIAGNOSIS — F3289 Other specified depressive episodes: Secondary | ICD-10-CM

## 2019-03-25 MED ORDER — METFORMIN HCL 500 MG PO TABS: 500.0000 mg | ORAL_TABLET | Freq: Two times a day (BID) | ORAL | 0 refills | Status: DC

## 2019-03-26 ENCOUNTER — Encounter (INDEPENDENT_AMBULATORY_CARE_PROVIDER_SITE_OTHER): Payer: Self-pay | Admitting: Family Medicine

## 2019-03-26 DIAGNOSIS — F329 Major depressive disorder, single episode, unspecified: Secondary | ICD-10-CM | POA: Insufficient documentation

## 2019-03-26 DIAGNOSIS — F32A Depression, unspecified: Secondary | ICD-10-CM | POA: Insufficient documentation

## 2019-03-26 NOTE — Progress Notes (Addendum)
Office: [phone]  /  Fax: [phone]   HPI:   Chief Complaint: OBESITY Kendon is here to discuss his progress with his obesity treatment plan. He is on the Category 4 plan and is following his eating plan approximately 75 % of the time. He states he is doing circuit training and cardio exercise 45 minutes 3 times per week. An had some celebrations over the past few weeks. He tends to struggle with snacking on unhealthy foods if they are available. His weight is 205 lb 12.8 oz (93.4 kg) today and he has maintained weight since his last visit. He has lost 51 lbs since starting treatment with [place].  Insulin Resistance Demarkus has a diagnosis of insulin resistance based on his elevated fasting insulin level >5. Although Jaxxson's blood glucose readings are still under good control, insulin resistance puts him at greater risk of metabolic syndrome and diabetes. His A1c is down to 5.0 from 6.2. He is taking metformin currently and continues to work on diet and exercise to decrease risk of diabetes. Rakshan denies polyphagia.  At risk for diabetes Eero is at higher than average risk for developing diabetes due to his obesity. He currently denies polyuria or polydipsia.  Vitamin D deficiency Tel has a diagnosis of vitamin D deficiency which is at goal. He is currently taking vit D every there week. He denies nausea, vomiting or muscle weakness.  Depression with emotional eating behaviors Cordairo has not started Bupropion prescribed at his last office visit.[name] He would like to wait and see if he can do without it. Dasir struggles with emotional eating and using food for comfort to the extent that it is negatively impacting his health. He often snacks when he is not hungry. Aiden sometimes feels he is out of control and then feels guilty that he made poor food choices. He has been working on behavior modification techniques to help reduce his emotional eating and has been somewhat  successful. He shows no sign of suicidal or homicidal ideations.  ASSESSMENT AND PLAN:  Insulin resistance - Plan: metFORMIN (GLUCOPHAGE) 500 MG tablet  Other depression - with emotional eating  At risk for diabetes mellitus  Class 1 obesity with serious comorbidity and body mass index (BMI) of 30.0 to 30.9 in adult, unspecified obesity type - BMI was greater at start of program   PLAN:  Insulin Resistance Jhony will continue to work on weight loss, exercise, and decreasing simple carbohydrates in his diet to help decrease the risk of diabetes. We dicussed metformin including benefits and risks. He was informed that eating too many simple carbohydrates or too many calories at one sitting increases the likelihood of GI side effects. Lakeisha agrees to continue metformin 500 mg BID #60 with no refills and follow up with [place] as directed to monitor his progress.  Diabetes risk counseling Vlad was given extended (15 minutes) diabetes prevention counseling today. He is 48 y.o. male and has risk factors for diabetes including obesity and insulin resistance. We discussed intensive lifestyle modifications today with an emphasis on weight loss as well as increasing exercise and decreasing simple carbohydrates in his diet.  Vitamin D Deficiency  He agrees to continue to take prescription Vit D @50 ,000 IU every other week to maintain his vit D levels and will follow up for routine testing of vitamin D, at least 2-3 times per year. He was informed of the risk of over-replacement of vitamin D and agrees to not increase his dose unless he discusses this  with [place] first.  Depression with Emotional Eating Behaviors We discussed behavior modification techniques today to help Nolyn deal with his emotional eating and depression. He has agreed to start Bupropion if needed and follow up as directed.  Obesity Gabrel is currently in the action stage of change. As such, his goal is to continue with weight  loss efforts He has agreed to follow the Category 4 plan Joven will continue his current exercise regimen for weight loss and overall health benefits. We discussed the following Behavioral Modification Strategies today: planning for success, increasing lean protein intake and decreasing simple carbohydrates   Koty has agreed to follow up with our clinic in 3 weeks. He was informed of the importance of frequent follow up visits to maximize his success with intensive lifestyle modifications for his multiple health conditions.  ALLERGIES: No Known Allergies  MEDICATIONS: Current Outpatient Medications on File Prior to Visit  Medication Sig Dispense Refill   augmented betamethasone dipropionate (DIPROLENE-AF) 0.05 % cream Apply topically 2 (two) times daily. 60 g 0   Vitamin D, Ergocalciferol, (DRISDOL) 1.25 MG (50000 UT) CAPS capsule Take 1 capsule (50,000 Units total) by mouth every 14 (fourteen) days. 2 capsule 0   buPROPion (WELLBUTRIN SR) 150 MG 12 hr tablet Take 1 tablet (150 mg total) by mouth daily. (Patient not taking: Reported on 03/25/2019) 30 tablet 0   No current facility-administered medications on file prior to visit.     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 Karsten Ro   CYSTOSCOPY WITH STENT PLACEMENT Left 02/18/2017   Procedure: CYSTOSCOPY/ LEFT RETROGRADE/ WITH LEFT STENT PLACEMENT;  Surgeon: Kathie Rhodes, MD;  Location: [facility];  Service:  Urology;  Laterality: Left;   EXTRACORPOREAL SHOCK WAVE LITHOTRIPSY Left 02/18/2017   Procedure: LEFT EXTRACORPOREAL SHOCK WAVE LITHOTRIPSY (ESWL);  Surgeon: Franchot Gallo, MD;  Location: [facility];  Service: Urology;  Laterality: Left;   EXTRACORPOREAL SHOCK WAVE LITHOTRIPSY Left 04/11/2017   Procedure: LEFT EXTRACORPOREAL SHOCK WAVE LITHOTRIPSY (ESWL);  Surgeon: Irine Seal, MD;  Location: [facility];  Service: Urology;  Laterality: Left;   LITHOTRIPSY     02/18/17   neck bipsy     beningn, in his 7s    SOCIAL HISTORY: Social History   Tobacco Use   Smoking status: Never Smoker   Smokeless tobacco: Never Used  Substance Use Topics   Alcohol use: Yes    Comment: rarely   Drug use: No    FAMILY HISTORY: Family History  Problem Relation Age of Onset   Diabetes Father    COPD Father    Heart disease Father        ?   Stroke Father    Sleep apnea Father    Obesity Father    Heart disease Maternal Grandfather    Heart disease  Paternal Grandmother    Diabetes Other        many fam members    Hyperlipidemia Mother        M and others   Cancer Mother    Colon cancer Neg Hx    Prostate cancer Neg Hx     ROS: Review of Systems  Constitutional: Negative for weight loss.  Genitourinary: Negative for frequency.  Endo/Heme/Allergies: Negative for polydipsia.       Negative for polyphagia  Psychiatric/Behavioral: Positive for depression. Negative for suicidal ideas.    PHYSICAL EXAM: Blood pressure 112/72, pulse 76, temperature 98 F (36.7 C), temperature source Oral, height 5\' 10"  (1.778 m), weight 205 lb 12.8 oz (93.4 kg), SpO2 97 %. Body mass index is 29.53 kg/m. Physical Exam Vitals signs reviewed.  Constitutional:      Appearance: Normal appearance. He is well-developed. He is obese.  Cardiovascular:     Rate and Rhythm: Normal rate.  Pulmonary:     Effort: Pulmonary effort is normal.  Musculoskeletal: Normal range of motion.  Skin:     General: Skin is warm and dry.  Neurological:     Mental Status: He is alert and oriented to person, place, and time.  Psychiatric:        Mood and Affect: Mood normal.        Behavior: Behavior normal.        Thought Content: Thought content does not include homicidal or suicidal ideation.     RECENT LABS AND TESTS: BMET    Component Value Date/Time   NA 144 02/11/2019 1426   K 4.5 02/11/2019 1426   CL 103 02/11/2019 1426   CO2 23 02/11/2019 1426   GLUCOSE 97 02/11/2019 1426   GLUCOSE 122 (H) 06/24/2018 0809   BUN 23 02/11/2019 1426   CREATININE 1.03 02/11/2019 1426   CALCIUM 9.2 02/11/2019 1426   GFRNONAA 85 02/11/2019 1426   GFRAA 98 02/11/2019 1426   Lab Results  Component Value Date   HGBA1C 5.0 02/11/2019   HGBA1C 6.2 (H) 09/18/2018   HGBA1C 5.5 06/24/2018   HGBA1C 5.6 06/28/2017   Lab Results  Component Value Date   INSULIN 16.4 02/11/2019   INSULIN 60.2 (H) 09/18/2018   CBC    Component Value Date/Time   WBC 5.4 09/18/2018 0855   WBC 5.6 06/24/2018 0809   RBC 4.81 09/18/2018 0855   RBC 4.76 06/24/2018 0809   HGB 15.1 09/18/2018 0855   HCT 41.5 09/18/2018 0855   PLT 176.0 06/24/2018 0809   MCV 86 09/18/2018 0855   MCH 31.4 09/18/2018 0855   MCH 31.0 02/06/2017 1025   MCHC 36.4 (H) 09/18/2018 0855   MCHC 34.0 06/24/2018 0809   RDW 13.5 09/18/2018 0855   LYMPHSABS 1.6 09/18/2018 0855   MONOABS 0.4 06/24/2018 0809   EOSABS 0.1 09/18/2018 0855   BASOSABS 0.0 09/18/2018 0855   Iron/TIBC/Ferritin/ %Sat No results found for: IRON, TIBC, FERRITIN, IRONPCTSAT Lipid Panel     Component Value Date/Time   CHOL 222 (H) 02/11/2019 1426   TRIG 199 (H) 02/11/2019 1426   HDL 33 (L) 02/11/2019 1426   CHOLHDL 8 06/24/2018 0809   VLDL 66.6 (H) 06/24/2018 0809   LDLCALC 149 (H) 02/11/2019 1426   LDLDIRECT 116.0 06/24/2018 0809   Hepatic Function Panel     Component Value Date/Time   PROT 6.9 02/11/2019 1426   ALBUMIN 4.7 02/11/2019 1426   AST 19  02/11/2019 1426   ALT 30 02/11/2019 1426  ALKPHOS 67 02/11/2019 1426   BILITOT 0.5 02/11/2019 1426      Component Value Date/Time   TSH 3.960 09/18/2018 0855   TSH 3.15 06/24/2018 0809   TSH 2.25 03/12/2016 1332     Ref. Range 02/11/2019 14:26  Vitamin D, 25-Hydroxy Latest Ref Range: 30.0 - 100.0 ng/mL 49.1    OBESITY BEHAVIORAL INTERVENTION VISIT  Today's visit was # 13   Starting weight: 256 lbs Starting date: 09/18/2018 Today's weight : 205 lbs  Today's date: 03/25/2019 Total lbs lost to date: 51    03/25/2019  Height 5\' 10"  (1.778 m)  Weight 205 lb 12.8 oz (93.4 kg)  BMI (Calculated) 29.53  BLOOD PRESSURE - SYSTOLIC [id number]  BLOOD PRESSURE - DIASTOLIC 72   Body Fat % [id number] %  Total Body Water (lbs) 105.8 lbs    ASK: We discussed the diagnosis of obesity with [place] today and Guled agreed to give [place] permission to discuss obesity behavioral modification therapy today.  ASSESS: Elih has the diagnosis of obesity and his BMI today is 29.53 Drevan is in the action stage of change   ADVISE: Demecio was educated on the multiple health risks of obesity as well as the benefit of weight loss to improve his health. He was advised of the need for long term treatment and the importance of lifestyle modifications to improve his current health and to decrease his risk of future health problems.  AGREE: Multiple dietary modification options and treatment options were discussed and  Jj agreed to follow the recommendations documented in the above note.  ARRANGE: Markley was educated on the importance of frequent visits to treat obesity as outlined per CMS and USPSTF guidelines and agreed to schedule his next follow up appointment today.  I, Doreene Nest, am acting as transcriptionist for [employer], FNP-C  I have reviewed the above documentation for accuracy and completeness, and I agree with the above.  - Ricketta Colantonio, FNP-C.

## 2019-03-29 ENCOUNTER — Other Ambulatory Visit (INDEPENDENT_AMBULATORY_CARE_PROVIDER_SITE_OTHER): Payer: Self-pay | Admitting: Bariatrics

## 2019-03-29 DIAGNOSIS — E559 Vitamin D deficiency, unspecified: Secondary | ICD-10-CM

## 2019-03-30 MED ORDER — VITAMIN D (ERGOCALCIFEROL) 1.25 MG (50000 UNIT) PO CAPS: 50000.0000 [IU] | ORAL_CAPSULE | ORAL | 0 refills | Status: DC

## 2019-03-30 NOTE — Addendum Note (Signed)
Addended by: Georgianne Fick on: 03/30/2019 01:27 PM   Modules accepted: Orders

## 2019-04-15 ENCOUNTER — Ambulatory Visit (INDEPENDENT_AMBULATORY_CARE_PROVIDER_SITE_OTHER): Payer: BC Managed Care – PPO | Admitting: Family Medicine

## 2019-04-15 ENCOUNTER — Encounter (INDEPENDENT_AMBULATORY_CARE_PROVIDER_SITE_OTHER): Payer: Self-pay | Admitting: Family Medicine

## 2019-04-15 ENCOUNTER — Other Ambulatory Visit: Payer: Self-pay

## 2019-04-15 VITALS — BP 117/77 | HR 75 | Temp 98.2°F | Ht 70.0 in | Wt 208.0 lb

## 2019-04-15 DIAGNOSIS — E8881 Metabolic syndrome: Secondary | ICD-10-CM | POA: Diagnosis not present

## 2019-04-15 DIAGNOSIS — F3289 Other specified depressive episodes: Secondary | ICD-10-CM

## 2019-04-15 DIAGNOSIS — E669 Obesity, unspecified: Secondary | ICD-10-CM

## 2019-04-15 DIAGNOSIS — Z6830 Body mass index (BMI) 30.0-30.9, adult: Secondary | ICD-10-CM

## 2019-04-15 NOTE — Progress Notes (Signed)
Office: [phone]  /  Fax: [phone]   HPI:   Chief Complaint: OBESITY Cameron Valenzuela is here to discuss his progress with his obesity treatment plan. He is on the Category 4 plan and is following his eating plan approximately 75% of the time. He states he is walking 20 minutes 4 times per week. Cameron Valenzuela has been off plan over the past few weeks and reports eating more sweets and junk food. He also feels he may not be eating enough protein. He has also not been going to the gym.  His weight is 208 lb (94.3 kg) today and has had a weight gain of 3 lbs since his last visit. He has lost 48 lbs since starting treatment with [place].  Depression with emotional eating behaviors Cameron Valenzuela is struggling with emotional eating and using food for comfort to the extent that it is negatively impacting his health. He often snacks when he is not hungry. Cameron Valenzuela sometimes feels he is out of control and then feels guilty that he made poor food choices. He has been working on behavior modification techniques to help reduce his emotional eating and has been somewhat successful. Cameron Valenzuela reports having cravings at work and he tends to give in. He is considering starting the bupropion which was prescribed several weeks ago. He wants to try to avoid taking it if possible. He shows no sign of suicidal or homicidal ideations.  Depression screen Cameron Valenzuela 2/9 09/18/2018 04/15/2018 04/12/2017 03/12/2016 02/14/2015  Decreased Interest 1 0 0 0 0  Down, Depressed, Hopeless 1 0 0 0 0  PHQ - 2 Score 2 0 0 0 0  Altered sleeping 3 - - - -  Tired, decreased energy 3 - - - -  Change in appetite 1 - - - -  Feeling bad or failure about yourself  1 - - - -  Trouble concentrating 0 - - - -  Moving slowly or fidgety/restless 3 - - - -  Suicidal thoughts 0 - - - -  PHQ-9 Score 13 - - - -  Difficult doing work/chores Somewhat difficult - - - -   Insulin Resistance Cameron Valenzuela has a diagnosis of insulin resistance based on his elevated fasting insulin  level >5. He was previously prediabetic. Although Cameron Valenzuela's blood glucose readings are still under good control, insulin resistance puts him at greater risk of metabolic syndrome and diabetes. He is taking metformin currently and continues to work on diet and exercise to decrease risk of diabetes. No polyphagia. Lab Results  Component Value Date   HGBA1C 5.0 02/11/2019     ASSESSMENT AND PLAN:  Insulin resistance  Other depression - with emotional eating   Class 1 obesity with serious comorbidity and body mass index (BMI) of 30.0 to 30.9 in adult, unspecified obesity type - BMI greater than 30 at start of program   PLAN:  Depression with Emotional Eating Behaviors We discussed behavior modification techniques today to help Cameron Valenzuela deal with his emotional eating and depression. Cameron Valenzuela will start bupropion if needed and will follow-up as directed.  Insulin Resistance Cameron Valenzuela will continue to work on weight loss, exercise, and decreasing simple carbohydrates in his diet to help decrease the risk of diabetes. Cameron Valenzuela will continue metformin and follow-up with [place] as directed to monitor his progress.  Obesity Cameron Valenzuela is currently in the action stage of change. As such, his goal is to continue with weight loss efforts. He has agreed to follow the Category 4 plan. Cameron Valenzuela has been instructed to go to  the gym 2 times per week for weight loss and overall health benefits. We discussed the following Behavioral Modification Strategies today: increasing lean protein intake and decreasing simple carbohydrates, better snacking choices, avoiding temptations, and planning for success.  Cameron Valenzuela has agreed to follow-up with our clinic in 3 weeks. He was informed of the importance of frequent follow-up visits to maximize his success with intensive lifestyle modifications for his multiple health conditions.  ALLERGIES: No Known Allergies  MEDICATIONS: Current Outpatient Medications on File Prior to  Visit  Medication Sig Dispense Refill  . augmented betamethasone dipropionate (DIPROLENE-AF) 0.05 % cream Apply topically 2 (two) times daily. 60 g 0  . metFORMIN (GLUCOPHAGE) 500 MG tablet Take 1 tablet (500 mg total) by mouth 2 (two) times daily with a meal. 60 tablet 0  . buPROPion (WELLBUTRIN SR) 150 MG 12 hr tablet Take 1 tablet (150 mg total) by mouth daily. (Patient not taking: Reported on 04/15/2019) 30 tablet 0  . Vitamin D, Ergocalciferol, (DRISDOL) 1.25 MG (50000 UT) CAPS capsule Take 1 capsule (50,000 Units total) by mouth every 14 (fourteen) days. (Patient not taking: Reported on 04/15/2019) 6 capsule 0   No current facility-administered medications on file prior to visit.     PAST MEDICAL HISTORY: Past Medical History:  Diagnosis Date  . Allergic rhinitis   . Allergy    seasonal  . Family history of adverse reaction to anesthesia    mother slow to wake up   . Fatigue   . History of kidney stones   . Kidney stones   . Knee pain, right   . Lower extremity edema   . Pinched nerve in neck   . Rosacea   . 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 Sleep apnea     PAST SURGICAL HISTORY: Past Surgical History:  Procedure Laterality Date  . Cystocopy     Dr. Karsten Ro  . CYSTOSCOPY WITH STENT PLACEMENT Left 02/18/2017   Procedure: CYSTOSCOPY/ LEFT RETROGRADE/ WITH LEFT STENT PLACEMENT;  Surgeon: Kathie Rhodes, MD;  Location: [facility];  Service: Urology;  Laterality: Left;  . EXTRACORPOREAL SHOCK WAVE LITHOTRIPSY Left 02/18/2017   Procedure: LEFT EXTRACORPOREAL SHOCK WAVE LITHOTRIPSY (ESWL);  Surgeon: Franchot Gallo, MD;  Location: [facility];  Service: Urology;  Laterality: Left;  . EXTRACORPOREAL SHOCK WAVE LITHOTRIPSY Left 04/11/2017   Procedure: LEFT EXTRACORPOREAL SHOCK WAVE LITHOTRIPSY (ESWL);  Surgeon: Irine Seal, MD;  Location: [facility];  Service: Urology;  Laterality: Left;  . LITHOTRIPSY     02/18/17   . neck bipsy     beningn, in his 64s    SOCIAL HISTORY: Social History   Tobacco Use  . Smoking status: Never Smoker  . Smokeless tobacco: Never Used  Substance Use Topics  . Alcohol use: Yes    Comment: rarely  . Drug use: No    FAMILY HISTORY: Family History  Problem Relation Age of Onset  . Diabetes Father   . COPD Father   . Heart disease Father        ?  . Stroke Father   . Sleep apnea Father   . Obesity Father   . Heart disease Maternal Grandfather   . Heart disease Paternal Grandmother   . Diabetes Other        many fam members   . Hyperlipidemia Mother        M and others  . Cancer Mother   .  Colon cancer Neg Hx   . Prostate cancer Neg Hx    ROS: Review of Systems  Endo/Heme/Allergies:       Negative for polyphagia.  Psychiatric/Behavioral: Positive for depression (emotional eating). Negative for suicidal ideas.       Negative for homicidal ideas.   PHYSICAL EXAM: Blood pressure 117/77, pulse 75, temperature 98.2 F (36.8 C), temperature source Oral, height 5\' 10"  (1.778 m), weight 208 lb (94.3 kg), SpO2 99 %. Body mass index is 29.84 kg/m. Physical Exam Vitals signs reviewed.  Constitutional:      Appearance: Normal appearance. He is obese.  Cardiovascular:     Rate and Rhythm: Normal rate.     Pulses: Normal pulses.  Pulmonary:     Effort: Pulmonary effort is normal.     Breath sounds: Normal breath sounds.  Musculoskeletal: Normal range of motion.  Skin:    General: Skin is warm and dry.  Neurological:     Mental Status: He is alert and oriented to person, place, and time.  Psychiatric:        Behavior: Behavior normal.   RECENT LABS AND TESTS: BMET    Component Value Date/Time   NA 144 02/11/2019 1426   K 4.5 02/11/2019 1426   CL 103 02/11/2019 1426   CO2 23 02/11/2019 1426   GLUCOSE 97 02/11/2019 1426   GLUCOSE 122 (H) 06/24/2018 0809   BUN 23 02/11/2019 1426   CREATININE 1.03 02/11/2019 1426   CALCIUM 9.2 02/11/2019 1426    GFRNONAA 85 02/11/2019 1426   GFRAA 98 02/11/2019 1426   Lab Results  Component Value Date   HGBA1C 5.0 02/11/2019   HGBA1C 6.2 (H) 09/18/2018   HGBA1C 5.5 06/24/2018   HGBA1C 5.6 06/28/2017   Lab Results  Component Value Date   INSULIN 16.4 02/11/2019   INSULIN 60.2 (H) 09/18/2018   CBC    Component Value Date/Time   WBC 5.4 09/18/2018 0855   WBC 5.6 06/24/2018 0809   RBC 4.81 09/18/2018 0855   RBC 4.76 06/24/2018 0809   HGB 15.1 09/18/2018 0855   HCT 41.5 09/18/2018 0855   PLT 176.0 06/24/2018 0809   MCV 86 09/18/2018 0855   MCH 31.4 09/18/2018 0855   MCH 31.0 02/06/2017 1025   MCHC 36.4 (H) 09/18/2018 0855   MCHC 34.0 06/24/2018 0809   RDW 13.5 09/18/2018 0855   LYMPHSABS 1.6 09/18/2018 0855   MONOABS 0.4 06/24/2018 0809   EOSABS 0.1 09/18/2018 0855   BASOSABS 0.0 09/18/2018 0855   Iron/TIBC/Ferritin/ %Sat No results found for: IRON, TIBC, FERRITIN, IRONPCTSAT Lipid Panel     Component Value Date/Time   CHOL 222 (H) 02/11/2019 1426   TRIG 199 (H) 02/11/2019 1426   HDL 33 (L) 02/11/2019 1426   CHOLHDL 8 06/24/2018 0809   VLDL 66.6 (H) 06/24/2018 0809   LDLCALC 149 (H) 02/11/2019 1426   LDLDIRECT 116.0 06/24/2018 0809   Hepatic Function Panel     Component Value Date/Time   PROT 6.9 02/11/2019 1426   ALBUMIN 4.7 02/11/2019 1426   AST 19 02/11/2019 1426   ALT 30 02/11/2019 1426   ALKPHOS 67 02/11/2019 1426   BILITOT 0.5 02/11/2019 1426      Component Value Date/Time   TSH 3.960 09/18/2018 0855   TSH 3.15 06/24/2018 0809   TSH 2.25 03/12/2016 1332   Results for EVER, COLOMBE (MRN [id number]) as of 04/15/2019 11:12  Ref. Range 02/11/2019 14:26  Vitamin D, 25-Hydroxy Latest Ref Range: 30.0 - 100.0  ng/mL 49.1   OBESITY BEHAVIORAL INTERVENTION VISIT  Today's visit was #14  Starting weight: 256 lbs Starting date: 09/18/2018 Today's weight: 208 lbs  Today's date: 04/15/2019 Total lbs lost to date: 48    04/15/2019  Height 5\' 10"  (1.778 m)   Weight 208 lb (94.3 kg)  BMI (Calculated) 29.84  BLOOD PRESSURE - SYSTOLIC [id number]  BLOOD PRESSURE - DIASTOLIC 77   Body Fat % 99991111 %  Total Body Water (lbs) 105.6 lbs   ASK: We discussed the diagnosis of obesity with Cameron Valenzuela today and Cameron Valenzuela agreed to give [place] permission to discuss obesity behavioral modification therapy today.  ASSESS: Cameron Valenzuela has the diagnosis of obesity and his BMI today is 29.8. Naveed is in the action stage of change.   ADVISE: Cameron Valenzuela was educated on the multiple health risks of obesity as well as the benefit of weight loss to improve his health. He was advised of the need for long term treatment and the importance of lifestyle modifications to improve his current health and to decrease his risk of future health problems.  AGREE: Multiple dietary modification options and treatment options were discussed and  Kedric agreed to follow the recommendations documented in the above note.  ARRANGE: Cameron Valenzuela was educated on the importance of frequent visits to treat obesity as outlined per CMS and USPSTF guidelines and agreed to schedule his next follow up appointment today.  IMichaelene Song, am acting as Location manager for [employer], FNP  I have reviewed the above documentation for accuracy and completeness, and I agree with the above.  - Savonna Birchmeier, FNP-C.

## 2019-04-17 ENCOUNTER — Telehealth: Payer: Self-pay

## 2019-04-17 ENCOUNTER — Encounter: Payer: Self-pay | Admitting: Family Medicine

## 2019-04-17 ENCOUNTER — Other Ambulatory Visit: Payer: Self-pay

## 2019-04-17 ENCOUNTER — Ambulatory Visit (INDEPENDENT_AMBULATORY_CARE_PROVIDER_SITE_OTHER): Payer: BC Managed Care – PPO | Admitting: Family Medicine

## 2019-04-17 DIAGNOSIS — R509 Fever, unspecified: Secondary | ICD-10-CM

## 2019-04-17 DIAGNOSIS — J029 Acute pharyngitis, unspecified: Secondary | ICD-10-CM

## 2019-04-17 MED ORDER — AMOXICILLIN-POT CLAVULANATE 875-125 MG PO TABS: 1.0000 | ORAL_TABLET | Freq: Two times a day (BID) | ORAL | 0 refills | Status: DC

## 2019-04-17 NOTE — Telephone Encounter (Signed)
Copied from [place] [phone]. Topic: Appointment Scheduling - Scheduling Inquiry for Clinic >> Apr 17, 2019 10:40 AM Cameron Valenzuela wrote: Reason for CRM:   Pt calling wants an appointment today for fever (not controlled with ibuprofen) and sore throat.  Called office, no answer.

## 2019-04-17 NOTE — Progress Notes (Signed)
CC: Fever  Cameron Valenzuela here for fever. Due to COVID-19 pandemic, we are interacting via web portal for an electronic face-to-face visit. I verified patient's ID using 2 identifiers. Patient agreed to proceed with visit via this method. Patient is at home, I am at office. Patient and I are present for visit.   Duration: 2 days  Associated symptoms: Fever (101.2 F) and sore throat Denies: sinus pain, rhinorrhea, itchy watery eyes, ear pain, ear drainage, wheezing, shortness of breath, myalgia and cough/gi s/s's Treatment to date: ibuprofen Sick contacts: Yes- wife w GI s/s's  ROS:  Const: + fevers HEENT: As noted in HPI Lungs: No SOB  Past Medical History:  Diagnosis Date  . Allergic rhinitis   . Allergy    seasonal  . Family history of adverse reaction to anesthesia    mother slow to wake up   . Fatigue   . History of kidney stones   . Kidney stones   . Knee pain, right   . Lower extremity edema   . Pinched nerve in neck   . Rosacea   . 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 Sleep apnea    Exam No conversational dyspnea Age appropriate judgment and insight Nml affect and mood  Fever, unspecified fever cause - Plan: amoxicillin-clavulanate (AUGMENTIN) 875-125 MG tablet  Sore throat - Plan: amoxicillin-clavulanate (AUGMENTIN) 875-125 MG tablet  Orders as above. Continue to push fluids, practice good hand hygiene, cover mouth when coughing. Empirically treat for strep throat. F/u prn. If starting to experience increasing fevers, shaking, or shortness of breath, seek immediate care. Pt voiced understanding and agreement to the plan.  Christopher, DO 04/17/19 12:13 PM

## 2019-05-05 ENCOUNTER — Ambulatory Visit: Payer: BC Managed Care – PPO | Admitting: Psychology

## 2019-05-06 ENCOUNTER — Encounter (INDEPENDENT_AMBULATORY_CARE_PROVIDER_SITE_OTHER): Payer: Self-pay | Admitting: Family Medicine

## 2019-05-06 ENCOUNTER — Other Ambulatory Visit: Payer: Self-pay

## 2019-05-06 ENCOUNTER — Ambulatory Visit (INDEPENDENT_AMBULATORY_CARE_PROVIDER_SITE_OTHER): Payer: BC Managed Care – PPO | Admitting: Family Medicine

## 2019-05-06 VITALS — BP 107/71 | HR 63 | Temp 98.2°F | Ht 70.0 in | Wt 203.0 lb

## 2019-05-06 DIAGNOSIS — Z9189 Other specified personal risk factors, not elsewhere classified: Secondary | ICD-10-CM | POA: Diagnosis not present

## 2019-05-06 DIAGNOSIS — F3289 Other specified depressive episodes: Secondary | ICD-10-CM | POA: Diagnosis not present

## 2019-05-06 DIAGNOSIS — E669 Obesity, unspecified: Secondary | ICD-10-CM | POA: Diagnosis not present

## 2019-05-06 DIAGNOSIS — Z6830 Body mass index (BMI) 30.0-30.9, adult: Secondary | ICD-10-CM

## 2019-05-06 DIAGNOSIS — E8881 Metabolic syndrome: Secondary | ICD-10-CM

## 2019-05-06 DIAGNOSIS — E88819 Insulin resistance, unspecified: Secondary | ICD-10-CM

## 2019-05-06 MED ORDER — METFORMIN HCL 500 MG PO TABS: 500.0000 mg | ORAL_TABLET | Freq: Two times a day (BID) | ORAL | 0 refills | Status: DC

## 2019-05-11 NOTE — Progress Notes (Signed)
Office: [phone]  /  Fax: [phone]   HPI:   Chief Complaint: OBESITY Cameron Valenzuela is here to discuss his progress with his obesity treatment plan. He is on the Category 4 plan and he is following his eating plan approximately 75 % of the time. He states he is exercising at the gym and doing circuit training 30 minutes 3 times per week. Cameron Valenzuela has increased exercise and is doing well on the plan. He has decreased junk food. Cameron Valenzuela has occasionally been indulgent at lunch while eating out. His weight is 203 lb (92.1 kg) today and has had a weight loss of 5 pounds over a period of 3 weeks since his last visit. He has lost 53 lbs since starting treatment with [place].  Insulin Resistance Cameron Valenzuela has a diagnosis of insulin resistance based on his elevated fasting insulin level >5. Although Cameron Valenzuela's blood glucose readings are still under good control, insulin resistance puts him at greater risk of metabolic syndrome and diabetes. He had previously been prediabetic and A1c was 6.2 on 09/18/18.  He is taking metformin twice daily and he denies polyphagia, nausea, vomiting or diarrhea. Cameron Valenzuela to work on diet and exercise to decrease risk of diabetes. Lab Results  Component Value Date   HGBA1C 5.0 02/11/2019    At risk for diabetes Cameron Valenzuela is at higher than average risk for developing diabetes due to his obesity and insulin resistance. He currently denies polyuria or polydipsia.  Depression with emotional eating behaviors Cameron Valenzuela tends to stress eat, but he is trying to eat healthier foods when he does this. He has not started Bupropion that was prescribed a few months ago. He is trying to do without it. He is struggling with emotional eating and using food for comfort to the extent that it is negatively impacting his health. He often snacks when he is not hungry. Cameron Valenzuela sometimes feels he is out of control and then feels guilty that he made poor food choices. He has been working on  behavior modification techniques to help reduce his emotional eating and has been somewhat successful. He shows no sign of suicidal or homicidal ideations.  ASSESSMENT AND PLAN:  Insulin resistance - Plan: metFORMIN (GLUCOPHAGE) 500 MG tablet  Other depression  At risk for diabetes mellitus  Class 1 obesity with serious comorbidity and body mass index (BMI) of 30.0 to 30.9 in adult, unspecified obesity type - Starting BMI greater then 30  PLAN:  Insulin Resistance Cameron Valenzuela will continue to work on weight loss, exercise, and decreasing simple carbohydrates in his diet to help decrease the risk of diabetes. We dicussed metformin including benefits and risks. He was informed that eating too many simple carbohydrates or too many calories at one sitting increases the likelihood of GI side effects. Cameron Valenzuela agreed to continue metformin 500 mg daily with a meal #60 with no refills and follow up with [place] as directed to monitor his progress.  Diabetes risk counseling Cameron Valenzuela was given extended (15 minutes) diabetes prevention counseling today. He is 48 y.o. male and has risk factors for diabetes including obesity and insulin resistance. We discussed intensive lifestyle modifications today with an emphasis on weight loss as well as increasing exercise and decreasing simple carbohydrates in his diet.  Depression with Emotional Eating Behaviors We discussed behavior modification techniques today to help Cameron Valenzuela deal with his emotional eating and depression. Cameron Valenzuela will start Bupropion if needed and he will follow up as directed.  Obesity Cameron Valenzuela is currently in the action stage of  change. As such, his goal is to continue with weight loss efforts He has agreed to follow the Category 4 plan Cameron Valenzuela will continue exercising at the gym and circuit training 30 minutes, 3 times per week for weight loss and overall health benefits. We discussed the following Behavioral Modification Strategies today: planning  for success, increasing lean protein intake, decreasing simple carbohydrates and dealing with family or coworker sabotage  Cameron Valenzuela has agreed to follow up with our clinic in 2 to 3 weeks. He was informed of the importance of frequent follow up visits to maximize his success with intensive lifestyle modifications for his multiple health conditions.  ALLERGIES: No Known Allergies  MEDICATIONS: Current Outpatient Medications on File Prior to Visit  Medication Sig Dispense Refill   augmented betamethasone dipropionate (DIPROLENE-AF) 0.05 % cream Apply topically 2 (two) times daily. 60 g 0   buPROPion (WELLBUTRIN SR) 150 MG 12 hr tablet Take 1 tablet (150 mg total) by mouth daily. 30 tablet 0   Vitamin D, Ergocalciferol, (DRISDOL) 1.25 MG (50000 UT) CAPS capsule Take 1 capsule (50,000 Units total) by mouth every 14 (fourteen) days. 6 capsule 0   No current facility-administered medications on file prior to visit.     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 Karsten Ro   CYSTOSCOPY WITH STENT PLACEMENT Left 02/18/2017   Procedure: CYSTOSCOPY/ LEFT RETROGRADE/ WITH LEFT STENT PLACEMENT;  Surgeon: Kathie Rhodes, MD;  Location: [facility];  Service: Urology;  Laterality: Left;   EXTRACORPOREAL SHOCK WAVE LITHOTRIPSY Left 02/18/2017   Procedure: LEFT EXTRACORPOREAL SHOCK WAVE LITHOTRIPSY (ESWL);  Surgeon: Franchot Gallo, MD;  Location: [facility];  Service: Urology;  Laterality:  Left;   EXTRACORPOREAL SHOCK WAVE LITHOTRIPSY Left 04/11/2017   Procedure: LEFT EXTRACORPOREAL SHOCK WAVE LITHOTRIPSY (ESWL);  Surgeon: Irine Seal, MD;  Location: [facility];  Service: Urology;  Laterality: Left;   LITHOTRIPSY     02/18/17   neck bipsy     beningn, in his 38s    SOCIAL HISTORY: Social History   Tobacco Use   Smoking status: Never Smoker   Smokeless tobacco: Never Used  Substance Use Topics   Alcohol use: Yes    Comment: rarely   Drug use: No    FAMILY HISTORY: Family History  Problem Relation Age of Onset   Diabetes Father    COPD Father    Heart disease Father        ?   Stroke Father    Sleep apnea Father    Obesity Father    Heart disease Maternal Grandfather    Heart disease Paternal Grandmother    Diabetes Other        many fam members    Hyperlipidemia Mother        M and others   Cancer Mother  Colon cancer Neg Hx    Prostate cancer Neg Hx     ROS: Review of Systems  Constitutional: Positive for weight loss.  Gastrointestinal: Negative for diarrhea, nausea and vomiting.  Genitourinary: Negative for frequency.  Endo/Heme/Allergies: Negative for polydipsia.       Negative for polyphagia    PHYSICAL EXAM: Blood pressure 107/71, pulse 63, temperature 98.2 F (36.8 C), temperature source Oral, height 5\' 10"  (1.778 m), weight 203 lb (92.1 kg), SpO2 99 %. Body mass index is 29.13 kg/m. Physical Exam Vitals signs reviewed.  Constitutional:      Appearance: Normal appearance. He is well-developed. He is obese.  Cardiovascular:     Rate and Rhythm: Normal rate.  Pulmonary:     Effort: Pulmonary effort is normal.  Musculoskeletal: Normal range of motion.  Skin:    General: Skin is warm and dry.  Neurological:     Mental Status: He is alert and oriented to person, place, and time.  Psychiatric:        Mood and Affect: Mood normal.        Behavior: Behavior normal.     RECENT LABS AND TESTS: BMET    Component  Value Date/Time   NA 144 02/11/2019 1426   K 4.5 02/11/2019 1426   CL 103 02/11/2019 1426   CO2 23 02/11/2019 1426   GLUCOSE 97 02/11/2019 1426   GLUCOSE 122 (H) 06/24/2018 0809   BUN 23 02/11/2019 1426   CREATININE 1.03 02/11/2019 1426   CALCIUM 9.2 02/11/2019 1426   GFRNONAA 85 02/11/2019 1426   GFRAA 98 02/11/2019 1426   Lab Results  Component Value Date   HGBA1C 5.0 02/11/2019   HGBA1C 6.2 (H) 09/18/2018   HGBA1C 5.5 06/24/2018   HGBA1C 5.6 06/28/2017   Lab Results  Component Value Date   INSULIN 16.4 02/11/2019   INSULIN 60.2 (H) 09/18/2018   CBC    Component Value Date/Time   WBC 5.4 09/18/2018 0855   WBC 5.6 06/24/2018 0809   RBC 4.81 09/18/2018 0855   RBC 4.76 06/24/2018 0809   HGB 15.1 09/18/2018 0855   HCT 41.5 09/18/2018 0855   PLT 176.0 06/24/2018 0809   MCV 86 09/18/2018 0855   MCH 31.4 09/18/2018 0855   MCH 31.0 02/06/2017 1025   MCHC 36.4 (H) 09/18/2018 0855   MCHC 34.0 06/24/2018 0809   RDW 13.5 09/18/2018 0855   LYMPHSABS 1.6 09/18/2018 0855   MONOABS 0.4 06/24/2018 0809   EOSABS 0.1 09/18/2018 0855   BASOSABS 0.0 09/18/2018 0855   Iron/TIBC/Ferritin/ %Sat No results found for: IRON, TIBC, FERRITIN, IRONPCTSAT Lipid Panel     Component Value Date/Time   CHOL 222 (H) 02/11/2019 1426   TRIG 199 (H) 02/11/2019 1426   HDL 33 (L) 02/11/2019 1426   CHOLHDL 8 06/24/2018 0809   VLDL 66.6 (H) 06/24/2018 0809   LDLCALC 149 (H) 02/11/2019 1426   LDLDIRECT 116.0 06/24/2018 0809   Hepatic Function Panel     Component Value Date/Time   PROT 6.9 02/11/2019 1426   ALBUMIN 4.7 02/11/2019 1426   AST 19 02/11/2019 1426   ALT 30 02/11/2019 1426   ALKPHOS 67 02/11/2019 1426   BILITOT 0.5 02/11/2019 1426      Component Value Date/Time   TSH 3.960 09/18/2018 0855   TSH 3.15 06/24/2018 0809   TSH 2.25 03/12/2016 1332     Ref. Range 02/11/2019 14:26  Vitamin D, 25-Hydroxy Latest Ref Range: 30.0 - 100.0 ng/mL 49.1    OBESITY  BEHAVIORAL  INTERVENTION VISIT  Today's visit was # 15   Starting weight: 256 lbs Starting date: 09/18/2018 Today's weight : 203 lbs Today's date: 05/06/2019 Total lbs lost to date: 53    05/06/2019  Height 5\' 10"  (1.778 m)  Weight 203 lb (92.1 kg)  BMI (Calculated) 29.13  BLOOD PRESSURE - SYSTOLIC [id number]  BLOOD PRESSURE - DIASTOLIC 71   Body Fat % 99991111 %  Total Body Water (lbs) 104.4 lbs    ASK: We discussed the diagnosis of obesity with Cameron Valenzuela today and Rodrigus agreed to give [place] permission to discuss obesity behavioral modification therapy today.  ASSESS: Tyheem has the diagnosis of obesity and his BMI today is 29.13 Taevin is in the action stage of change   ADVISE: Cameron Valenzuela was educated on the multiple health risks of obesity as well as the benefit of weight loss to improve his health. He was advised of the need for long term treatment and the importance of lifestyle modifications to improve his current health and to decrease his risk of future health problems.  AGREE: Multiple dietary modification options and treatment options were discussed and  Kingstin agreed to follow the recommendations documented in the above note.  ARRANGE: Merrell was educated on the importance of frequent visits to treat obesity as outlined per CMS and USPSTF guidelines and agreed to schedule his next follow up appointment today.  Corey Skains, am acting as Location manager for [employer], FNP-C.  I have reviewed the above documentation for accuracy and completeness, and I agree with the above.  - Teagen Mcleary, FNP-C.

## 2019-05-12 ENCOUNTER — Encounter (INDEPENDENT_AMBULATORY_CARE_PROVIDER_SITE_OTHER): Payer: Self-pay | Admitting: Family Medicine

## 2019-05-27 ENCOUNTER — Encounter (INDEPENDENT_AMBULATORY_CARE_PROVIDER_SITE_OTHER): Payer: Self-pay | Admitting: Family Medicine

## 2019-05-27 ENCOUNTER — Other Ambulatory Visit: Payer: Self-pay

## 2019-05-27 ENCOUNTER — Ambulatory Visit (INDEPENDENT_AMBULATORY_CARE_PROVIDER_SITE_OTHER): Payer: BC Managed Care – PPO | Admitting: Family Medicine

## 2019-05-27 VITALS — BP 115/71 | HR 78 | Temp 98.3°F | Ht 70.0 in | Wt 208.0 lb

## 2019-05-27 DIAGNOSIS — Z9189 Other specified personal risk factors, not elsewhere classified: Secondary | ICD-10-CM

## 2019-05-27 DIAGNOSIS — E669 Obesity, unspecified: Secondary | ICD-10-CM | POA: Diagnosis not present

## 2019-05-27 DIAGNOSIS — E8881 Metabolic syndrome: Secondary | ICD-10-CM | POA: Diagnosis not present

## 2019-05-27 DIAGNOSIS — E559 Vitamin D deficiency, unspecified: Secondary | ICD-10-CM | POA: Diagnosis not present

## 2019-05-27 DIAGNOSIS — Z6830 Body mass index (BMI) 30.0-30.9, adult: Secondary | ICD-10-CM

## 2019-05-27 DIAGNOSIS — E88819 Insulin resistance, unspecified: Secondary | ICD-10-CM

## 2019-05-27 MED ORDER — VITAMIN D (ERGOCALCIFEROL) 1.25 MG (50000 UNIT) PO CAPS: 50000.0000 [IU] | ORAL_CAPSULE | ORAL | 0 refills | Status: DC

## 2019-05-27 MED ORDER — METFORMIN HCL 500 MG PO TABS: 500.0000 mg | ORAL_TABLET | Freq: Two times a day (BID) | ORAL | 0 refills | Status: DC

## 2019-05-27 NOTE — Progress Notes (Signed)
Office: [phone]  /  Fax: [phone]   HPI:   Chief Complaint: OBESITY Cameron Valenzuela is here to discuss his progress with his obesity treatment plan. He is on the Category 4 plan and is following his eating plan approximately 50 to 75 % of the time. He states he is exercising at the gym 30 to 60 minutes 2 to 3 times per week. Cameron Valenzuela was off track, due to Halloween, eating too much candy and drinking too much alcohol. He had candy at his house and he admits to indulging quite a bit. His weight is 208 lb (94.3 kg) today and has had a weight gain of 5 pounds over a period of 3 weeks since his last visit. He has lost 48 lbs since starting treatment with [place].  Insulin Resistance Cameron Valenzuela has a diagnosis of insulin resistance based on his elevated fasting insulin level >5.  Cameron Valenzuela is on metformin currently. He has felt slightly queasy when he indulged in too many sweets while on metformin. He continues to work on diet and exercise to decrease risk of diabetes. Cameron Valenzuela admits polyphagia. Lab Results  Component Value Date   HGBA1C 5.0 02/11/2019    At risk for diabetes Cameron Valenzuela is at higher than average risk for developing diabetes due to his obesity and insulin resistance. He currently denies polyuria or polydipsia.  Vitamin D deficiency Cameron Valenzuela has a diagnosis of vitamin D deficiency. Cameron Valenzuela is on vit D and his vitamin D level is nearly at goal (49.1 02/11/19). Cameron Valenzuela denies nausea, vomiting or muscle weakness.  ASSESSMENT AND PLAN:  Insulin resistance - Plan: metFORMIN (GLUCOPHAGE) 500 MG tablet  Vitamin D deficiency - Plan: Vitamin D, Ergocalciferol, (DRISDOL) 1.25 MG (50000 UT) CAPS capsule  At risk for diabetes mellitus  Class 1 obesity with serious comorbidity and body mass index (BMI) of 30.0 to 30.9 in adult, unspecified obesity type - Starting BMI greater then 30  PLAN:  Insulin Resistance Cameron Valenzuela will continue to work on weight loss, exercise, and decreasing simple  carbohydrates in his diet to help decrease the risk of diabetes. We discussed metformin including benefits and risks. He was informed that eating too many simple carbohydrates or too many calories at one sitting increases the likelihood of GI side effects. Cameron Valenzuela agrees to continue metformin 500 mg two times daily with a meal #60 with no refills and follow up with [place] as directed to monitor his progress. We will check A1c in 1 month.  Diabetes risk counseling Cameron Valenzuela was given extended (15 minutes) diabetes prevention counseling today. He is 48 y.o. male and has risk factors for diabetes including obesity and insulin resistance. We discussed intensive lifestyle modifications today with an emphasis on weight loss as well as increasing exercise and decreasing simple carbohydrates in his diet.  Vitamin D Deficiency  Cameron Valenzuela agrees to continue to take prescription Vit D @50 ,000 IU every other week #6 with no refills and he will follow up for routine testing of vitamin D, at least 2-3 times per year. He was informed of the risk of over-replacement of vitamin D and agrees to not increase his dose unless he discusses this with [place] first. We will check vitamin D level in 1 month and Cameron Valenzuela agrees to follow up as directed.  Obesity Cameron Valenzuela is currently in the action stage of change. As such, his goal is to continue with weight loss efforts He has agreed to follow the Category 4 plan Cameron Valenzuela will continue exercising at the gym for 30 to 60  minutes, 2 to 3 times per week for weight loss and overall health benefits. We discussed the following Behavioral Modification Strategies today: planning for success, decreasing simple carbohydrates and decrease eating out  Cameron Valenzuela has agreed to follow up with our clinic in 3 weeks. He was informed of the importance of frequent follow up visits to maximize his success with intensive lifestyle modifications for his multiple health conditions.  ALLERGIES: No Known Allergies   MEDICATIONS: Current Outpatient Medications on File Prior to Visit  Medication Sig Dispense Refill  . augmented betamethasone dipropionate (DIPROLENE-AF) 0.05 % cream Apply topically 2 (two) times daily. 60 g 0  . buPROPion (WELLBUTRIN SR) 150 MG 12 hr tablet Take 1 tablet (150 mg total) by mouth daily. (Patient not taking: Reported on 05/27/2019) 30 tablet 0   No current facility-administered medications on file prior to visit.     PAST MEDICAL HISTORY: Past Medical History:  Diagnosis Date  . Allergic rhinitis   . Allergy    seasonal  . Family history of adverse reaction to anesthesia    mother slow to wake up   . Fatigue   . History of kidney stones   . Kidney stones   . Knee pain, right   . Lower extremity edema   . Pinched nerve in neck   . Rosacea   . 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 Sleep apnea     PAST SURGICAL HISTORY: Past Surgical History:  Procedure Laterality Date  . Cystocopy     Dr. Karsten Ro  . CYSTOSCOPY WITH STENT PLACEMENT Left 02/18/2017   Procedure: CYSTOSCOPY/ LEFT RETROGRADE/ WITH LEFT STENT PLACEMENT;  Surgeon: Kathie Rhodes, MD;  Location: [facility];  Service: Urology;  Laterality: Left;  . EXTRACORPOREAL SHOCK WAVE LITHOTRIPSY Left 02/18/2017   Procedure: LEFT EXTRACORPOREAL SHOCK WAVE LITHOTRIPSY (ESWL);  Surgeon: Franchot Gallo, MD;  Location: [facility];  Service: Urology;  Laterality: Left;  . EXTRACORPOREAL SHOCK WAVE LITHOTRIPSY Left 04/11/2017   Procedure: LEFT EXTRACORPOREAL SHOCK WAVE LITHOTRIPSY (ESWL);  Surgeon: Irine Seal, MD;  Location: [facility];  Service: Urology;  Laterality: Left;  . LITHOTRIPSY     02/18/17  . neck bipsy     beningn, in his 34s    SOCIAL HISTORY: Social History   Tobacco Use  . Smoking status: Never Smoker  . Smokeless tobacco: Never Used  Substance Use Topics  . Alcohol use: Yes    Comment: rarely  . Drug use: No    FAMILY  HISTORY: Family History  Problem Relation Age of Onset  . Diabetes Father   . COPD Father   . Heart disease Father        ?  . Stroke Father   . Sleep apnea Father   . Obesity Father   . Heart disease Maternal Grandfather   . Heart disease Paternal Grandmother   . Diabetes Other        many fam members   . Hyperlipidemia Mother        M and others  . Cancer Mother   . Colon cancer Neg Hx   . Prostate cancer Neg Hx     ROS: Review of Systems  Constitutional: Negative for weight loss.  Gastrointestinal: Positive for nausea. Negative for vomiting.  Genitourinary: Negative for frequency.  Musculoskeletal:       Negative for muscle weakness  Endo/Heme/Allergies: Negative for polydipsia.  Positive for polyphagia    PHYSICAL EXAM: Blood pressure 115/71, pulse 78, temperature 98.3 F (36.8 C), temperature source Oral, height 5\' 10"  (1.778 m), weight 208 lb (94.3 kg), SpO2 98 %. Body mass index is 29.84 kg/m. Physical Exam Vitals signs reviewed.  Constitutional:      Appearance: Normal appearance. He is well-developed. He is obese.  Cardiovascular:     Rate and Rhythm: Normal rate.  Pulmonary:     Effort: Pulmonary effort is normal.  Musculoskeletal: Normal range of motion.  Skin:    General: Skin is warm and dry.  Neurological:     Mental Status: He is alert and oriented to person, place, and time.  Psychiatric:        Mood and Affect: Mood normal.        Behavior: Behavior normal.     RECENT LABS AND TESTS: BMET    Component Value Date/Time   NA 144 02/11/2019 1426   K 4.5 02/11/2019 1426   CL 103 02/11/2019 1426   CO2 23 02/11/2019 1426   GLUCOSE 97 02/11/2019 1426   GLUCOSE 122 (H) 06/24/2018 0809   BUN 23 02/11/2019 1426   CREATININE 1.03 02/11/2019 1426   CALCIUM 9.2 02/11/2019 1426   GFRNONAA 85 02/11/2019 1426   GFRAA 98 02/11/2019 1426   Lab Results  Component Value Date   HGBA1C 5.0 02/11/2019   HGBA1C 6.2 (H) 09/18/2018   HGBA1C 5.5  06/24/2018   HGBA1C 5.6 06/28/2017   Lab Results  Component Value Date   INSULIN 16.4 02/11/2019   INSULIN 60.2 (H) 09/18/2018   CBC    Component Value Date/Time   WBC 5.4 09/18/2018 0855   WBC 5.6 06/24/2018 0809   RBC 4.81 09/18/2018 0855   RBC 4.76 06/24/2018 0809   HGB 15.1 09/18/2018 0855   HCT 41.5 09/18/2018 0855   PLT 176.0 06/24/2018 0809   MCV 86 09/18/2018 0855   MCH 31.4 09/18/2018 0855   MCH 31.0 02/06/2017 1025   MCHC 36.4 (H) 09/18/2018 0855   MCHC 34.0 06/24/2018 0809   RDW 13.5 09/18/2018 0855   LYMPHSABS 1.6 09/18/2018 0855   MONOABS 0.4 06/24/2018 0809   EOSABS 0.1 09/18/2018 0855   BASOSABS 0.0 09/18/2018 0855   Iron/TIBC/Ferritin/ %Sat No results found for: IRON, TIBC, FERRITIN, IRONPCTSAT Lipid Panel     Component Value Date/Time   CHOL 222 (H) 02/11/2019 1426   TRIG 199 (H) 02/11/2019 1426   HDL 33 (L) 02/11/2019 1426   CHOLHDL 8 06/24/2018 0809   VLDL 66.6 (H) 06/24/2018 0809   LDLCALC 149 (H) 02/11/2019 1426   LDLDIRECT 116.0 06/24/2018 0809   Hepatic Function Panel     Component Value Date/Time   PROT 6.9 02/11/2019 1426   ALBUMIN 4.7 02/11/2019 1426   AST 19 02/11/2019 1426   ALT 30 02/11/2019 1426   ALKPHOS 67 02/11/2019 1426   BILITOT 0.5 02/11/2019 1426      Component Value Date/Time   TSH 3.960 09/18/2018 0855   TSH 3.15 06/24/2018 0809   TSH 2.25 03/12/2016 1332    Results for DU, TEPLY (MRN [id number]) as of 05/27/2019 14:23  Ref. Range 02/11/2019 14:26  Vitamin D, 25-Hydroxy Latest Ref Range: 30.0 - 100.0 ng/mL 49.1    OBESITY BEHAVIORAL INTERVENTION VISIT  Today's visit was # 16   Starting weight: 256 lbs Starting date: 09/18/2018 Today's weight : 208 lbs Today's date: 05/27/2019 Total lbs lost to date: 48    05/27/2019  Height  5\' 10"  (1.778 m)  Weight 208 lb (94.3 kg)  BMI (Calculated) 29.84  BLOOD PRESSURE - SYSTOLIC [id number]  BLOOD PRESSURE - DIASTOLIC 71   Body Fat % 0000000 %  Total Body Water (lbs)  106.6 lbs    ASK: We discussed the diagnosis of obesity with [place] today and Henrick agreed to give [place] permission to discuss obesity behavioral modification therapy today.  ASSESS: Cameron Valenzuela has the diagnosis of obesity and his BMI today is 29.84 Ha[place] is in the action stage of change   ADVISE: Canon was educated on the multiple health risks of obesity as well as the benefit of weight loss to improve his health. He was advised of the need for long term treatment and the importance of lifestyle modifications to improve his current health and to decrease his risk of future health problems.  AGREE: Multiple dietary modification options and treatment options were discussed and  Roarke agreed to follow the recommendations documented in the above note.  ARRANGE: [place] was educated on the importance of frequent visits to treat obesity as outlined per CMS and USPSTF guidelines and agreed to schedule his next follow up appointment today.  Corey Skains, am acting as Location manager for [employer], FNP-C.  I have reviewed the above documentation for accuracy and completeness, and I agree with the above.  - Adeola Dennen, FNP-C.

## 2019-06-02 ENCOUNTER — Encounter (INDEPENDENT_AMBULATORY_CARE_PROVIDER_SITE_OTHER): Payer: Self-pay | Admitting: Family Medicine

## 2019-06-17 ENCOUNTER — Other Ambulatory Visit: Payer: Self-pay

## 2019-06-17 ENCOUNTER — Telehealth (INDEPENDENT_AMBULATORY_CARE_PROVIDER_SITE_OTHER): Payer: BC Managed Care – PPO | Admitting: Family Medicine

## 2019-06-17 ENCOUNTER — Encounter (INDEPENDENT_AMBULATORY_CARE_PROVIDER_SITE_OTHER): Payer: Self-pay | Admitting: Family Medicine

## 2019-06-17 DIAGNOSIS — E8881 Metabolic syndrome: Secondary | ICD-10-CM | POA: Diagnosis not present

## 2019-06-17 DIAGNOSIS — E559 Vitamin D deficiency, unspecified: Secondary | ICD-10-CM

## 2019-06-17 DIAGNOSIS — E669 Obesity, unspecified: Secondary | ICD-10-CM | POA: Diagnosis not present

## 2019-06-17 DIAGNOSIS — Z6830 Body mass index (BMI) 30.0-30.9, adult: Secondary | ICD-10-CM

## 2019-06-17 DIAGNOSIS — F3289 Other specified depressive episodes: Secondary | ICD-10-CM | POA: Diagnosis not present

## 2019-06-17 MED ORDER — METFORMIN HCL 500 MG PO TABS: 500.0000 mg | ORAL_TABLET | Freq: Two times a day (BID) | ORAL | 0 refills | Status: DC

## 2019-06-17 MED ORDER — VITAMIN D (ERGOCALCIFEROL) 1.25 MG (50000 UNIT) PO CAPS: 50000.0000 [IU] | ORAL_CAPSULE | ORAL | 0 refills | Status: DC

## 2019-06-18 NOTE — Progress Notes (Signed)
Office: [phone]  /  Fax: [phone] TeleHealth Visit:  Cameron Valenzuela has verbally consented to this TeleHealth visit today. The patient is located at work, the provider is located at the [employer] [facility] office. The participants in this visit include the listed provider and patient and any and all parties involved. The visit was conducted today via WebEx.  HPI:   Chief Complaint: OBESITY Cameron Valenzuela is here to discuss his progress with his obesity treatment plan. He is on the Category 4 plan and is following his eating plan approximately 75 % of the time. He states he is exercising at the gym 30 to 60 minutes 2 times per week. [name] is drinking less alcohol, but he has maintained his weight, and he has not made headway with weight loss. Thanksgiving had him off track. He traveled down to [place]. He is mostly back on track. [name] has cut back on protein to lose weight. He is eating out too much recently at dinner. We were unable to weigh the patient today for this TeleHealth visit. He feels as if he has maintained weight since his last visit (weight not reported). He has lost 48 lbs since starting treatment with [place].  Insulin Resistance [name] has a diagnosis of insulin resistance based on his elevated fasting insulin level >5. Although [name]'s blood glucose readings are still under good control, insulin resistance puts him at greater risk of metabolic syndrome and diabetes. His A1c was previously 6.2 and is now down to 5.0. He is on metformin twice daily. Shadd continues to work on diet and exercise to decrease risk of diabetes.  Vitamin D deficiency [name] has a diagnosis of vitamin D deficiency. His last vitamin D level was 49.1 (02/11/19) and is at goal. [name] is maintained on prescription vit D 50,000 IU every 14 days. He denies nausea, vomiting or muscle weakness.  Depression with emotional eating behaviors [name] reports eating when stressed and he has been  struggling with this recently. He tries to eat an apple when he has cravings. He has a prescription for Bupropion, but he wants to hold off on taking it. [name] He shows no sign of suicidal or homicidal ideations.  ASSESSMENT AND PLAN:  Insulin resistance - Plan: metFORMIN (GLUCOPHAGE) 500 MG tablet  Vitamin D deficiency - Plan: Vitamin D, Ergocalciferol, (DRISDOL) 1.25 MG (50000 UT) CAPS capsule  Other depression - with emotional eating  Class 1 obesity with serious comorbidity and body mass index (BMI) of 30.0 to 30.9 in adult, unspecified obesity type  PLAN:  Insulin Resistance [name] will continue to work on weight loss, exercise, and decreasing simple carbohydrates in his diet to help decrease the risk of diabetes. [name] agrees to continue metformin 500 mg two times daily with meals #60 with no refills and follow up with [place] as directed to monitor his progress.  Vitamin D Deficiency [name] was informed that low vitamin D levels contributes to fatigue and are associated with obesity, breast, and colon cancer. [name] agrees to continue to take prescription Vit D @50 ,000 IU every 14 days #6 with no refills and he will follow up for routine testing of vitamin D, at least 2-3 times per year. He was informed of the risk of over-replacement of vitamin D and agrees to not increase his dose unless he discusses this with [place] first. [name] agrees to follow up as directed.  Emotional Eating Behaviors (other depression) We discussed better snacking choices. [name] agrees to follow up with our clinic in  2 weeks.  Obesity [name] is currently in the action stage of change. As such, his goal is to continue with weight loss efforts He has agreed to follow the Category 4 plan [name] will increase exercise to 3 to 4 days per week for weight loss and overall health benefits. We discussed the following Behavioral Modification Strategies today: planning for success, keeping healthy foods in the home,  better snacking choices, increasing lean protein intake, decrease eating out, work on meal planning and easy cooking plans and emotional eating strategies  [name] has agreed to follow up with our clinic in 2 weeks. He was informed of the importance of frequent follow up visits to maximize his success with intensive lifestyle modifications for his multiple health conditions.  ALLERGIES: No Known Allergies  MEDICATIONS: Current Outpatient Medications on File Prior to Visit  Medication Sig Dispense Refill  . augmented betamethasone dipropionate (DIPROLENE-AF) 0.05 % cream Apply topically 2 (two) times daily. 60 g 0  . buPROPion (WELLBUTRIN SR) 150 MG 12 hr tablet Take 1 tablet (150 mg total) by mouth daily. (Patient not taking: Reported on 05/27/2019) 30 tablet 0   No current facility-administered medications on file prior to visit.     PAST MEDICAL HISTORY: Past Medical History:  Diagnosis Date  . Allergic rhinitis   . Allergy    seasonal  . Family history of adverse reaction to anesthesia    mother slow to wake up   . Fatigue   . History of kidney stones   . Kidney stones   . Knee pain, right   . Lower extremity edema   . Pinched nerve in neck   . Rosacea   . 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 Sleep apnea     PAST SURGICAL HISTORY: Past Surgical History:  Procedure Laterality Date  . Cystocopy     Dr. Karsten Ro  . CYSTOSCOPY WITH STENT PLACEMENT Left 02/18/2017   Procedure: CYSTOSCOPY/ LEFT RETROGRADE/ WITH LEFT STENT PLACEMENT;  Surgeon: Kathie Rhodes, MD;  Location: [facility];  Service: Urology;  Laterality: Left;  . EXTRACORPOREAL SHOCK WAVE LITHOTRIPSY Left 02/18/2017   Procedure: LEFT EXTRACORPOREAL SHOCK WAVE LITHOTRIPSY (ESWL);  Surgeon: Franchot Gallo, MD;  Location: [facility];  Service: Urology;  Laterality: Left;  . EXTRACORPOREAL SHOCK WAVE LITHOTRIPSY Left 04/11/2017   Procedure: LEFT  EXTRACORPOREAL SHOCK WAVE LITHOTRIPSY (ESWL);  Surgeon: Irine Seal, MD;  Location: [facility];  Service: Urology;  Laterality: Left;  . LITHOTRIPSY     02/18/17  . neck bipsy     beningn, in his 19s    SOCIAL HISTORY: Social History   Tobacco Use  . Smoking status: Never Smoker  . Smokeless tobacco: Never Used  Substance Use Topics  . Alcohol use: Yes    Comment: rarely  . Drug use: No    FAMILY HISTORY: Family History  Problem Relation Age of Onset  . Diabetes Father   . COPD Father   . Heart disease Father        ?  . Stroke Father   . Sleep apnea Father   . Obesity Father   . Heart disease Maternal Grandfather   . Heart disease Paternal Grandmother   . Diabetes Other        many fam members   . Hyperlipidemia Mother        M and others  . Cancer Mother   . Colon  cancer Neg Hx   . Prostate cancer Neg Hx     ROS: Review of Systems  Constitutional: Negative for weight loss.  Gastrointestinal: Negative for nausea and vomiting.  Musculoskeletal:       Negative for muscle weakness  Psychiatric/Behavioral: Positive for depression. Negative for suicidal ideas.    PHYSICAL EXAM: Pt in no acute distress  RECENT LABS AND TESTS: BMET    Component Value Date/Time   NA 144 02/11/2019 1426   K 4.5 02/11/2019 1426   CL 103 02/11/2019 1426   CO2 23 02/11/2019 1426   GLUCOSE 97 02/11/2019 1426   GLUCOSE 122 (H) 06/24/2018 0809   BUN 23 02/11/2019 1426   CREATININE 1.03 02/11/2019 1426   CALCIUM 9.2 02/11/2019 1426   GFRNONAA 85 02/11/2019 1426   GFRAA 98 02/11/2019 1426   Lab Results  Component Value Date   HGBA1C 5.0 02/11/2019   HGBA1C 6.2 (H) 09/18/2018   HGBA1C 5.5 06/24/2018   HGBA1C 5.6 06/28/2017   Lab Results  Component Value Date   INSULIN 16.4 02/11/2019   INSULIN 60.2 (H) 09/18/2018   CBC    Component Value Date/Time   WBC 5.4 09/18/2018 0855   WBC 5.6 06/24/2018 0809   RBC 4.81 09/18/2018 0855   RBC 4.76 06/24/2018 0809   HGB 15.1  09/18/2018 0855   HCT 41.5 09/18/2018 0855   PLT 176.0 06/24/2018 0809   MCV 86 09/18/2018 0855   MCH 31.4 09/18/2018 0855   MCH 31.0 02/06/2017 1025   MCHC 36.4 (H) 09/18/2018 0855   MCHC 34.0 06/24/2018 0809   RDW 13.5 09/18/2018 0855   LYMPHSABS 1.6 09/18/2018 0855   MONOABS 0.4 06/24/2018 0809   EOSABS 0.1 09/18/2018 0855   BASOSABS 0.0 09/18/2018 0855   Iron/TIBC/Ferritin/ %Sat No results found for: IRON, TIBC, FERRITIN, IRONPCTSAT Lipid Panel     Component Value Date/Time   CHOL 222 (H) 02/11/2019 1426   TRIG 199 (H) 02/11/2019 1426   HDL 33 (L) 02/11/2019 1426   CHOLHDL 8 06/24/2018 0809   VLDL 66.6 (H) 06/24/2018 0809   LDLCALC 149 (H) 02/11/2019 1426   LDLDIRECT 116.0 06/24/2018 0809   Hepatic Function Panel     Component Value Date/Time   PROT 6.9 02/11/2019 1426   ALBUMIN 4.7 02/11/2019 1426   AST 19 02/11/2019 1426   ALT 30 02/11/2019 1426   ALKPHOS 67 02/11/2019 1426   BILITOT 0.5 02/11/2019 1426      Component Value Date/Time   TSH 3.960 09/18/2018 0855   TSH 3.15 06/24/2018 0809   TSH 2.25 03/12/2016 1332     Ref. Range 02/11/2019 14:26  Vitamin D, 25-Hydroxy Latest Ref Range: 30.0 - 100.0 ng/mL 49.1    I, Doreene Nest, am acting as Location manager for [employer], FNP-C.  I have reviewed the above documentation for accuracy and completeness, and I agree with the above.  - Desmond Szabo, FNP-C.

## 2019-06-30 ENCOUNTER — Encounter (INDEPENDENT_AMBULATORY_CARE_PROVIDER_SITE_OTHER): Payer: Self-pay

## 2019-07-01 ENCOUNTER — Encounter (INDEPENDENT_AMBULATORY_CARE_PROVIDER_SITE_OTHER): Payer: Self-pay | Admitting: Bariatrics

## 2019-07-01 ENCOUNTER — Other Ambulatory Visit: Payer: Self-pay

## 2019-07-01 ENCOUNTER — Telehealth (INDEPENDENT_AMBULATORY_CARE_PROVIDER_SITE_OTHER): Payer: BC Managed Care – PPO | Admitting: Bariatrics

## 2019-07-01 DIAGNOSIS — Z6830 Body mass index (BMI) 30.0-30.9, adult: Secondary | ICD-10-CM | POA: Diagnosis not present

## 2019-07-01 DIAGNOSIS — E559 Vitamin D deficiency, unspecified: Secondary | ICD-10-CM | POA: Diagnosis not present

## 2019-07-01 DIAGNOSIS — E6609 Other obesity due to excess calories: Secondary | ICD-10-CM

## 2019-07-01 DIAGNOSIS — E8881 Metabolic syndrome: Secondary | ICD-10-CM | POA: Diagnosis not present

## 2019-07-02 NOTE — Progress Notes (Signed)
Office: [phone]  /  Fax: [phone] TeleHealth Visit:  Cameron Valenzuela has verbally consented to this TeleHealth visit today. The patient is located in the car, the provider is located at the [employer] [facility] office. The participants in this visit include the listed provider and patient. The visit was conducted today via telephone call (18 minutes).  HPI:  Chief Complaint: OBESITY Cameron Valenzuela is here to discuss his progress with his obesity treatment plan. He is on the Category 4 plan and states he is following his eating plan approximately 75% of the time. He states he is walking/cardio 30 minutes 3-4 times per week.  Cameron Valenzuela states that his weight remains the same (208 lbs).  Today's visit was #18 Starting weight: 256 lbs Starting date: 09/18/2018   Insulin Resistance Cameron Valenzuela has a diagnosis of insulin resistance and is taking metformin 500 mg BID. No polyphagia.  Vitamin D deficiency Cameron Valenzuela has a diagnosis of Vitamin D deficiency and is taking Vitamin D every 2 weeks.  ASSESSMENT AND PLAN:  Insulin resistance  Vitamin D deficiency  Class 1 obesity due to excess calories with serious comorbidity and body mass index (BMI) of 30.0 to 30.9 in adult  PLAN:  Insulin Resistance Cameron Valenzuela will continue to work on weight loss, exercise, and decreasing simple carbohydrates to help decrease the risk of diabetes. Cameron Valenzuela will continue metformin and agreed to follow-up with [place] as directed to closely monitor his progress.  Vitamin D Deficiency Cameron Valenzuela was informed that low Vitamin D levels contributes to fatigue and are associated with obesity, breast, and colon cancer. He agrees to continue Vit D and will follow-up for routine testing of Vitamin D, at least 2-3 times per year. He was informed of the risk of over-replacement of Vitamin D and agrees to not increase his dose unless he discusses this with [place] first. Cameron Valenzuela agrees to follow-up with our clinic in 3  weeks.  Obesity Cameron Valenzuela is currently in the action stage of change. As such, his goal is to continue with weight loss efforts. He has agreed to follow the Category 4 plan. Cameron Valenzuela will work on meal planning, intentional eating, increasing his water intake to 64 ounces or more daily, and increasing his protein intake.  Cameron Valenzuela has been instructed to continue and increase exercise for weight loss and overall health benefits. We discussed the following Behavioral Modification Strategies today: increasing lean protein intake, decreasing simple carbohydrates, increasing vegetables, increase H20 intake, decrease eating out, no skipping meals, work on meal planning and easy cooking plans, and keeping healthy foods in the home.  Cameron Valenzuela has agreed to follow-up with our clinic in 3 weeks. He was informed of the importance of frequent followup visits to maximize his success with intensive lifestyle modifications for his multiple health conditions.  ALLERGIES: No Known Allergies  MEDICATIONS: Current Outpatient Medications on File Prior to Visit  Medication Sig Dispense Refill  . augmented betamethasone dipropionate (DIPROLENE-AF) 0.05 % cream Apply topically 2 (two) times daily. 60 g 0  . buPROPion (WELLBUTRIN SR) 150 MG 12 hr tablet Take 1 tablet (150 mg total) by mouth daily. (Patient not taking: Reported on 05/27/2019) 30 tablet 0  . metFORMIN (GLUCOPHAGE) 500 MG tablet Take 1 tablet (500 mg total) by mouth 2 (two) times daily with a meal. 60 tablet 0  . Vitamin D, Ergocalciferol, (DRISDOL) 1.25 MG (50000 UT) CAPS capsule Take 1 capsule (50,000 Units total) by mouth every 14 (fourteen) days. 6 capsule 0   No current facility-administered medications  on file prior to visit.    PAST MEDICAL HISTORY: Past Medical History:  Diagnosis Date  . Allergic rhinitis   . Allergy    seasonal  . Family history of adverse reaction to anesthesia    mother slow to wake up   . Fatigue   . History of kidney  stones   . Kidney stones   . Knee pain, right   . Lower extremity edema   . Pinched nerve in neck   . Rosacea   . 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 Sleep apnea     PAST SURGICAL HISTORY: Past Surgical History:  Procedure Laterality Date  . Cystocopy     Dr. Karsten Ro  . CYSTOSCOPY WITH STENT PLACEMENT Left 02/18/2017   Procedure: CYSTOSCOPY/ LEFT RETROGRADE/ WITH LEFT STENT PLACEMENT;  Surgeon: Kathie Rhodes, MD;  Location: [facility];  Service: Urology;  Laterality: Left;  . EXTRACORPOREAL SHOCK WAVE LITHOTRIPSY Left 02/18/2017   Procedure: LEFT EXTRACORPOREAL SHOCK WAVE LITHOTRIPSY (ESWL);  Surgeon: Franchot Gallo, MD;  Location: [facility];  Service: Urology;  Laterality: Left;  . EXTRACORPOREAL SHOCK WAVE LITHOTRIPSY Left 04/11/2017   Procedure: LEFT EXTRACORPOREAL SHOCK WAVE LITHOTRIPSY (ESWL);  Surgeon: Irine Seal, MD;  Location: [facility];  Service: Urology;  Laterality: Left;  . LITHOTRIPSY     02/18/17  . neck bipsy     beningn, in his 58s    SOCIAL HISTORY: Social History   Tobacco Use  . Smoking status: Never Smoker  . Smokeless tobacco: Never Used  Substance Use Topics  . Alcohol use: Yes    Comment: rarely  . Drug use: No    FAMILY HISTORY: Family History  Problem Relation Age of Onset  . Diabetes Father   . COPD Father   . Heart disease Father        ?  . Stroke Father   . Sleep apnea Father   . Obesity Father   . Heart disease Maternal Grandfather   . Heart disease Paternal Grandmother   . Diabetes Other        many fam members   . Hyperlipidemia Mother        M and others  . Cancer Mother   . Colon cancer Neg Hx   . Prostate cancer Neg Hx    ROS: Review of Systems  Endo/Heme/Allergies:       Negative for polyphagia.   PHYSICAL EXAM: There were no vitals taken for this visit. There is no height or weight on file to calculate BMI. Physical Exam: Pt in no acute  distress.  RECENT LABS AND TESTS: BMET    Component Value Date/Time   NA 144 02/11/2019 1426   K 4.5 02/11/2019 1426   CL 103 02/11/2019 1426   CO2 23 02/11/2019 1426   GLUCOSE 97 02/11/2019 1426   GLUCOSE 122 (H) 06/24/2018 0809   BUN 23 02/11/2019 1426   CREATININE 1.03 02/11/2019 1426   CALCIUM 9.2 02/11/2019 1426   GFRNONAA 85 02/11/2019 1426   GFRAA 98 02/11/2019 1426   Lab Results  Component Value Date   HGBA1C 5.0 02/11/2019   HGBA1C 6.2 (H) 09/18/2018   HGBA1C 5.5 06/24/2018   HGBA1C 5.6 06/28/2017   Lab Results  Component Value Date   INSULIN 16.4 02/11/2019   INSULIN 60.2 (H) 09/18/2018   CBC    Component Value Date/Time   WBC 5.4 09/18/2018 0855  WBC 5.6 06/24/2018 0809   RBC 4.81 09/18/2018 0855   RBC 4.76 06/24/2018 0809   HGB 15.1 09/18/2018 0855   HCT 41.5 09/18/2018 0855   PLT 176.0 06/24/2018 0809   MCV 86 09/18/2018 0855   MCH 31.4 09/18/2018 0855   MCH 31.0 02/06/2017 1025   MCHC 36.4 (H) 09/18/2018 0855   MCHC 34.0 06/24/2018 0809   RDW 13.5 09/18/2018 0855   LYMPHSABS 1.6 09/18/2018 0855   MONOABS 0.4 06/24/2018 0809   EOSABS 0.1 09/18/2018 0855   BASOSABS 0.0 09/18/2018 0855   Iron/TIBC/Ferritin/ %Sat No results found for: IRON, TIBC, FERRITIN, IRONPCTSAT Lipid Panel     Component Value Date/Time   CHOL 222 (H) 02/11/2019 1426   TRIG 199 (H) 02/11/2019 1426   HDL 33 (L) 02/11/2019 1426   CHOLHDL 8 06/24/2018 0809   VLDL 66.6 (H) 06/24/2018 0809   LDLCALC 149 (H) 02/11/2019 1426   LDLDIRECT 116.0 06/24/2018 0809   Hepatic Function Panel     Component Value Date/Time   PROT 6.9 02/11/2019 1426   ALBUMIN 4.7 02/11/2019 1426   AST 19 02/11/2019 1426   ALT 30 02/11/2019 1426   ALKPHOS 67 02/11/2019 1426   BILITOT 0.5 02/11/2019 1426      Component Value Date/Time   TSH 3.960 09/18/2018 0855   TSH 3.15 06/24/2018 0809   TSH 2.25 03/12/2016 1332    OBESITY BEHAVIORAL INTERVENTION VISIT DOCUMENTATION FOR INSURANCE (~15  minutes)  I, Michaelene Song, am acting as Location manager for [employer], DO  I have reviewed the above documentation for accuracy and completeness, and I agree with the above. Jearld Lesch, DO

## 2019-07-24 ENCOUNTER — Encounter: Payer: BC Managed Care – PPO | Admitting: Internal Medicine

## 2019-07-27 ENCOUNTER — Encounter: Payer: Self-pay | Admitting: Neurology

## 2019-07-27 ENCOUNTER — Ambulatory Visit (INDEPENDENT_AMBULATORY_CARE_PROVIDER_SITE_OTHER): Payer: BC Managed Care – PPO | Admitting: Bariatrics

## 2019-07-27 ENCOUNTER — Other Ambulatory Visit: Payer: Self-pay

## 2019-07-27 ENCOUNTER — Ambulatory Visit (INDEPENDENT_AMBULATORY_CARE_PROVIDER_SITE_OTHER): Payer: BC Managed Care – PPO | Admitting: Neurology

## 2019-07-27 ENCOUNTER — Encounter (INDEPENDENT_AMBULATORY_CARE_PROVIDER_SITE_OTHER): Payer: Self-pay | Admitting: Bariatrics

## 2019-07-27 VITALS — BP 121/73 | HR 66 | Temp 98.2°F | Ht 70.0 in | Wt 215.0 lb

## 2019-07-27 VITALS — BP 128/68 | HR 68 | Temp 97.0°F | Ht 70.0 in | Wt 215.0 lb

## 2019-07-27 DIAGNOSIS — E669 Obesity, unspecified: Secondary | ICD-10-CM | POA: Diagnosis not present

## 2019-07-27 DIAGNOSIS — E8881 Metabolic syndrome: Secondary | ICD-10-CM | POA: Diagnosis not present

## 2019-07-27 DIAGNOSIS — Z6830 Body mass index (BMI) 30.0-30.9, adult: Secondary | ICD-10-CM

## 2019-07-27 DIAGNOSIS — R0683 Snoring: Secondary | ICD-10-CM

## 2019-07-27 DIAGNOSIS — Z9989 Dependence on other enabling machines and devices: Secondary | ICD-10-CM

## 2019-07-27 DIAGNOSIS — G4733 Obstructive sleep apnea (adult) (pediatric): Secondary | ICD-10-CM | POA: Diagnosis not present

## 2019-07-27 DIAGNOSIS — E6609 Other obesity due to excess calories: Secondary | ICD-10-CM

## 2019-07-27 DIAGNOSIS — F3289 Other specified depressive episodes: Secondary | ICD-10-CM

## 2019-07-27 DIAGNOSIS — E559 Vitamin D deficiency, unspecified: Secondary | ICD-10-CM

## 2019-07-27 DIAGNOSIS — Z9189 Other specified personal risk factors, not elsewhere classified: Secondary | ICD-10-CM | POA: Diagnosis not present

## 2019-07-27 DIAGNOSIS — G4734 Idiopathic sleep related nonobstructive alveolar hypoventilation: Secondary | ICD-10-CM

## 2019-07-27 MED ORDER — METFORMIN HCL 500 MG PO TABS: 500.0000 mg | ORAL_TABLET | Freq: Two times a day (BID) | ORAL | 0 refills | Status: DC

## 2019-07-27 MED ORDER — VITAMIN D (ERGOCALCIFEROL) 1.25 MG (50000 UNIT) PO CAPS: 50000.0000 [IU] | ORAL_CAPSULE | ORAL | 0 refills | Status: DC

## 2019-07-27 MED ORDER — BUPROPION HCL ER (SR) 150 MG PO TB12: 150.0000 mg | ORAL_TABLET | Freq: Every day | ORAL | 0 refills | Status: DC

## 2019-07-27 NOTE — Progress Notes (Signed)
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 Cameron Valenzuela was infected with coronavirus in May of 2020, but had a mild course.  He continued to use CPAP since June and he felt that CPAP actually may have helped him to overcome the symptoms of COVID-19.  I reviewed his download which shows that he is a very compliant patient he has used the machine 20 out of 30 days and 26 of those days over 4 hours reaching a compliance of 87%.  The average user time is 6 hours 3 minutes uses an AutoSet between a minimum pressure of 7 maximum pressure of 13 cmH2O and 3 cm EPR however his residual AHI reflects an apnea index of 9.8/h of which 9.1 are obstructive in nature.  His 95th percentile pressure need is 12.9 cmH2O straddling the upper setting.  I would like to increase his maximum pressure from 13-16 cmH2O there has been no major air leakage so his settings and his supplies do not have to be changed otherwise.     CD/Interval history from 28 July 2018.  I have the pleasure of meeting Ms. Kiya Elek today a 49 year old established CPAP patient in our practice who is followed for his primary care needs by Dr. Belinda Fisher, MD.  He was last seen in April 2019 with the first compliance data gathered from his auto titration device after he underwent a sleep study and was diagnosed with sleep apnea.  His current AutoSet pressures between 7 and 13 cmH2O, with an expiratory pressure relief of 3 cmH2O.  He has a high snoring index while using CPAP which is unusual.  His apnea index is 2.7, his hypopnea index 3.6 but he has an RDI of 2.5 on top of these 2 data.  The 95th percentile pressure is 13 cmH2O as prescribed, the residual AHI total is  6.3 and a little bit too high.       UPDATE 4/15/2019[name], 49 year old male returns for follow-up with newly diagnosed obstructive sleep apnea here for initial CPAP compliance.  He denies difficulty adjusting to the machine.  Compliance data dated 09/24/2017-10/23/2017 shows compliance data greater than 4 hours at 97%.  Average usage 6 hours 8 minutes.  Set pressure 7-12 cm.  EPR level 3.  AHI 6.9.  ESS 4. FSS 36.  He returns for reevaluation. PLAN: CPAP compliance 97% Due to AHI of 6.9 we will increase max pressure slightly to 13 cm  Follow-up in 4 months for repeat compliance [name], GNP, [facility], APRN    11/5/18CDTimothy Chauncey Cruel Kraemer is a 49 y.o. male , seen here as in a referral from Dr. Larose Kells for snoring and witnessed apnea following a medical procedure under anesthesia.  Chief complaint according to patient : "I am not excessively tired or fatigued during the day, but the reported apnea is worrisome"  Mr. Matsuyama is a Caucasian 49 year old married gentleman who presents following 2 lithotripsies under anesthesia- after each apnea had been noticed by medical staff. The first procedure took place in May,  the second in August 2018. The patient also reports that his body mass index and weight has fluctuated vastly, currently being at a higher end.  Last summer he lost about 61  pounds over a period of only 3 months while working out of state.  This came back with an additional weight gain, now at 240 pounds. Other diagnosis were listed below.   Sleep habits are as follows: The patient usually watches television during the last hour before he retreats to bed.  Bedtime is around 11 PM and he has no trouble to fall asleep.  He does not have a preferred sleep position, sleeps on 2 pillows, in the bedroom that is described as cool, quiet and dark.  He shares a bedroom with his wife. He does recall his dreams occasionally, dreams are not described as threatening or nightmarish in  character.  He may have one nocturia break at night but not more. He wakes up spontaneously at about 5:30 AM, and he rises feeling usually refreshed and restored. He wakes with a dry mouth, rarely with headaches in season for his allergic rhinitis.     REVIEW OF SYSTEMS: Full 14 system review of systems performed and notable only for those listed, all others are neg:  Gained further weight Sleep : Obstructive sleep apnea with CPAP.  FSS at 21/ 63 points  Epworth Sleepiness Scale at 6/ 24 points.   unchanged after Covid infection in May 2020.    ALLERGIES: No Known Allergies  HOME MEDICATIONS: Outpatient Medications Prior to Visit  Medication Sig Dispense Refill  . metFORMIN (GLUCOPHAGE) 500 MG tablet Take 1 tablet (500 mg total) by mouth 2 (two) times daily with a meal. 60 tablet 0  . Vitamin D, Ergocalciferol, (DRISDOL) 1.25 MG (50000 UT) CAPS capsule Take 1 capsule (50,000 Units total) by mouth every 14 (fourteen) days. 6 capsule 0  . buPROPion (WELLBUTRIN SR) 150 MG 12 hr tablet Take 1 tablet (150 mg total) by mouth daily. (Patient not taking: Reported on 07/27/2019) 30 tablet 0   No facility-administered medications prior to visit.    PAST MEDICAL HISTORY: Past Medical History:  Diagnosis Date  . Allergic rhinitis   . Allergy    seasonal  . Family history of adverse reaction to anesthesia    mother slow to wake up   . Fatigue   . History of kidney stones   . Kidney stones   . Knee pain, right   . Lower extremity edema   . Pinched nerve in neck   . Rosacea   . 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 Sleep apnea     PAST SURGICAL HISTORY: Past Surgical History:  Procedure Laterality Date  . Cystocopy     Dr. Karsten Ro  . CYSTOSCOPY WITH STENT PLACEMENT Left 02/18/2017   Procedure: CYSTOSCOPY/ LEFT RETROGRADE/ WITH LEFT STENT PLACEMENT;  Surgeon: Kathie Rhodes, MD;  Location: [facility];  Service:  Urology;  Laterality: Left;  . EXTRACORPOREAL SHOCK WAVE LITHOTRIPSY Left 02/18/2017   Procedure: LEFT EXTRACORPOREAL SHOCK WAVE LITHOTRIPSY (ESWL);  Surgeon: Franchot Gallo, MD;  Location: [facility];  Service: Urology;  Laterality: Left;  . EXTRACORPOREAL SHOCK WAVE LITHOTRIPSY Left 04/11/2017   Procedure: LEFT EXTRACORPOREAL SHOCK WAVE LITHOTRIPSY (ESWL);  Surgeon: Irine Seal, MD;  Location: [facility];  Service: Urology;  Laterality: Left;  . LITHOTRIPSY     02/18/17  . neck bipsy     beningn, in his 25s    FAMILY HISTORY: Family History  Problem Relation Age of Onset  . Diabetes Father   . COPD Father   . Heart disease  Father        ?  . Stroke Father   . Sleep apnea Father   . Obesity Father   . Heart disease Maternal Grandfather   . Heart disease Paternal Grandmother   . Diabetes Other        many fam members   . Hyperlipidemia Mother        M and others  . Cancer Mother   . Colon cancer Neg Hx   . Prostate cancer Neg Hx     SOCIAL HISTORY: Social History   Socioeconomic History  . Marital status: Married    Spouse name: Vaughan Basta  . Number of children: 1  . Years of education: Not on file  . Highest education level: Not on file  Occupational History  . 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 Smoking status: Never Smoker  . Smokeless tobacco: Never Used  Substance and Sexual Activity  . Alcohol use: Yes    Comment: rarely  . Drug use: No  . Sexual activity: Yes  Other Topics Concern  . Not on file  Social History Narrative   Household-- pt, wife, child     (mother in law [name], passed away October 23, 2015)   Social Determinants of Health   Financial Resource Strain:   . Difficulty of Paying Living Expenses: Not on file  Food Insecurity:   . Worried About Charity fundraiser in the Last Year: Not on file  . Ran Out of Food in the Last Year: Not on file  Transportation Needs:   . Lack of Transportation (Medical): Not on file  . Lack of  Transportation (Non-Medical): Not on file  Physical Activity:   . Days of Exercise per Week: Not on file  . Minutes of Exercise per Session: Not on file  Stress:   . Feeling of Stress : Not on file  Social Connections:   . Frequency of Communication with Friends and Family: Not on file  . Frequency of Social Gatherings with Friends and Family: Not on file  . Attends Religious Services: Not on file  . Active Member of Clubs or Organizations: Not on file  . Attends Archivist Meetings: Not on file  . Marital Status: Not on file  Intimate Partner Violence:   . Fear of Current or Ex-Partner: Not on file  . Emotionally Abused: Not on file  . Physically Abused: Not on file  . Sexually Abused: Not on file     PHYSICAL EXAM  Vitals:   07/27/19 1329  BP: 128/68  Pulse: 68  Temp: (!) 97 F (36.1 C)  Weight: 215 lb (97.5 kg)  Height: 5\' 10"  (1.778 m)   Body mass index is 30.85 kg/m.  Generalized: Well developed, obese male in no acute distress  Head: normocephalic and atraumatic,. Oropharynx benign mallopatti 4-5 Neck: Supple, neck circumference 19 inches.  Musculoskeletal: No deformity   Neurological examination   Mentation: Alert oriented to time, place, history taking. Attention span and concentration appropriate. Recent and remote memory intact.  Follows all commands speech and language fluent. ESS 4  Cranial nerve :  Taste and smell are intact. He never lost either.    Pupils were equal round reactive to light extraocular movements were full,. Facial sensation and strength were normal. hearing was intact to finger rubbing bilaterally. Uvula tongue midline. head turning and shoulder shrug were normal and symmetric.Tongue protrusion into cheek strength was normal. Motor: normal bulk and tone, C  6 impingement.  Sensory: normal and symmetric to light touch,  Coordination: finger-nose bilaterally, no dysmetria, no tremor.  Gait and Station:  Rising up from seated  position without assistance, normal stance,  moderate stride, good arm swing, smooth turning.   DIAGNOSTIC DATA (LABS, IMAGING, TESTING)  download CPAP data .  - I reviewed patient records, labs, notes, testing and imaging myself where available.  Lab Results  Component Value Date   WBC 5.4 09/18/2018   HGB 15.1 09/18/2018   HCT 41.5 09/18/2018   MCV 86 09/18/2018   PLT 176.0 06/24/2018      Component Value Date/Time   NA 144 02/11/2019 1426   K 4.5 02/11/2019 1426   CL 103 02/11/2019 1426   CO2 23 02/11/2019 1426   GLUCOSE 97 02/11/2019 1426   GLUCOSE 122 (H) 06/24/2018 0809   BUN 23 02/11/2019 1426   CREATININE 1.03 02/11/2019 1426   CALCIUM 9.2 02/11/2019 1426   PROT 6.9 02/11/2019 1426   ALBUMIN 4.7 02/11/2019 1426   AST 19 02/11/2019 1426   ALT 30 02/11/2019 1426   ALKPHOS 67 02/11/2019 1426   BILITOT 0.5 02/11/2019 1426   GFRNONAA 85 02/11/2019 1426   GFRAA 98 02/11/2019 1426   Lab Results  Component Value Date   CHOL 222 (H) 02/11/2019   HDL 33 (L) 02/11/2019   LDLCALC 149 (H) 02/11/2019   LDLDIRECT 116.0 06/24/2018   TRIG 199 (H) 02/11/2019   CHOLHDL 8 06/24/2018   Lab Results  Component Value Date   HGBA1C 5.0 02/11/2019   Lab Results  Component Value Date   VITAMINB12 570 09/18/2018   Lab Results  Component Value Date   TSH 3.960 09/18/2018      ASSESSMENT AND PLAN  49 y.o. year old male  has a past medical history of Allergic rhinitis, OSA on CPAP, Obesity, and covid 19 in 11-2018. [name]   1) There are no central apneas emerging so I think we are safe to further increase the pressure unless the patient cannot tolerated. He hs no air leaks- has facial hair.  I had given an order to increase pressure already in my last visit with the patient, but it was never followed. He uses [facility], DME.  I will increase CPAP upper Pressure to 16 cm water, 2 cm EPR. No aerophagia reported.   2)amb referral for medical weight management with Dr. Leafy Ro,  [facility] [address], [place], [place] [address] [phone]

## 2019-07-28 ENCOUNTER — Encounter (INDEPENDENT_AMBULATORY_CARE_PROVIDER_SITE_OTHER): Payer: Self-pay | Admitting: Bariatrics

## 2019-07-28 DIAGNOSIS — E785 Hyperlipidemia, unspecified: Secondary | ICD-10-CM | POA: Insufficient documentation

## 2019-07-28 LAB — COMPREHENSIVE METABOLIC PANEL
ALT: 22 IU/L (ref 0–44)
AST: 18 IU/L (ref 0–40)
Albumin/Globulin Ratio: 2.3 — ABNORMAL HIGH (ref 1.2–2.2)
Albumin: 4.8 g/dL (ref 4.0–5.0)
Alkaline Phosphatase: 76 IU/L (ref 39–117)
BUN/Creatinine Ratio: 18 (ref 9–20)
BUN: 17 mg/dL (ref 6–24)
Bilirubin Total: 0.4 mg/dL (ref 0.0–1.2)
CO2: 26 mmol/L (ref 20–29)
Calcium: 9.6 mg/dL (ref 8.7–10.2)
Chloride: 101 mmol/L (ref 96–106)
Creatinine, Ser: 0.96 mg/dL (ref 0.76–1.27)
GFR calc Af Amer: 107 mL/min/{1.73_m2} (ref >59)
GFR calc non Af Amer: 92 mL/min/{1.73_m2} (ref >59)
Globulin, Total: 2.1 g/dL (ref 1.5–4.5)
Glucose: 103 mg/dL — ABNORMAL HIGH (ref 65–99)
Potassium: 4.6 mmol/L (ref 3.5–5.2)
Sodium: 140 mmol/L (ref 134–144)
Total Protein: 6.9 g/dL (ref 6.0–8.5)

## 2019-07-28 LAB — HEMOGLOBIN A1C
Est. average glucose Bld gHb Est-mCnc: 100 mg/dL
Hgb A1c MFr Bld: 5.1 % (ref 4.8–5.6)

## 2019-07-28 LAB — LIPID PANEL WITH LDL/HDL RATIO
Cholesterol, Total: 232 mg/dL — ABNORMAL HIGH (ref 100–199)
HDL: 32 mg/dL — ABNORMAL LOW (ref >39)
LDL Chol Calc (NIH): 157 mg/dL — ABNORMAL HIGH (ref 0–99)
LDL/HDL Ratio: 4.9 ratio — ABNORMAL HIGH (ref 0.0–3.6)
Triglycerides: 234 mg/dL — ABNORMAL HIGH (ref 0–149)
VLDL Cholesterol Cal: 43 mg/dL — ABNORMAL HIGH (ref 5–40)

## 2019-07-28 LAB — INSULIN, RANDOM: INSULIN: 22.7 u[IU]/mL (ref 2.6–24.9)

## 2019-07-28 LAB — VITAMIN D 25 HYDROXY (VIT D DEFICIENCY, FRACTURES): Vit D, 25-Hydroxy: 20.8 ng/mL — ABNORMAL LOW (ref 30.0–100.0)

## 2019-07-28 NOTE — Progress Notes (Signed)
Chief Complaint:   Cameron Valenzuela is here to discuss his progress with his obesity treatment plan along with follow-up of his obesity related diagnoses. Cameron Valenzuela is on the Category 4 Plan and states he is following his eating plan approximately 50% of the time. Cameron Valenzuela states he is exercising 0 minutes 0 times per week.  Today's visit was #: 41 Starting weight: 256 lbs Starting date: 09/18/2018 Today's weight: 215 lbs Today's date: 07/27/2019 Total lbs lost to date: 41  Total lbs lost since last in-office visit: 0  Interim History: Cameron Valenzuela went up 7 lbs during the holidays. He still has issues with temptations (stress eating) and is able to "reel it back in."  Subjective:   Vitamin D deficiency.  His Vitamin D level was 49.1 on 02/11/2019. He is currently taking vit D. He denies nausea, vomiting or muscle weakness. He denies adverse effects and reports minimal sun exposure.  Insulin resistance. Cameron Valenzuela has a diagnosis of insulin resistance based on his elevated fasting insulin level >5. He continues to work on diet and exercise to decrease his risk of diabetes. No polyphagia.  Lab Results  Component Value Date   INSULIN 16.4 02/11/2019   INSULIN 60.2 (H) 09/18/2018   Other depression - with emotional eating. Cameron Valenzuela is struggling with emotional eating and using food for comfort to the extent that it is negatively impacting his health. He often snacks when he is not hungry. Cameron Valenzuela sometimes feels he is out of control and then feels guilty that he made poor food choices. He has been working on behavior modification techniques to help reduce his emotional eating and has been somewhat successful. He does report increased stress eating. He shows no sign of suicidal or homicidal ideations.  At risk for hypoglycemia. Cameron Valenzuela is at increased risk for hypoglycemia due to changes in diet, diagnosis of diabetes, and/or insulin use. Cameron Valenzuela is not not currently taking insulin.    Assessment/Plan:   Vitamin D deficiency. Low Vitamin D level contributes to fatigue and are associated with obesity, breast, and colon cancer. He agrees to take Vitamin D (25 hydroxy), Vitamin D, Ergocalciferol, (DRISDOL) 1.25 MG (50000 UT) CAPS capsule every 14 days x2 with 0 refills and will have routine testing of vitamin D today.  Insulin resistance. HgB A1c, Insulin, random, Lipid Panel With LDL/HDL Ratio, Comprehensive Metabolic Panel (CMET) ordered. metFORMIN (GLUCOPHAGE) 500 MG tablet 1 PO BID with meals #60 with 0 refills prescription was given.  Other depression - with emotional eating. buPROPion (WELLBUTRIN SR) 150 MG 12 hr tablet prescription was given.  At risk for hypoglycemia. Cameron Valenzuela was given approximately 15 minutes of counseling today regarding prevention of hypoglycemia secondary to IR and dietary changes. He was advised of symptoms of hypoglycemia. Cameron Valenzuela was instructed to eat regular meals.   Class 1 obesity due to excess calories with serious comorbidity and body mass index (BMI) of 30.0 to 30.9 in adult.  Cameron Valenzuela is currently in the action stage of change. As such, his goal is to continue with weight loss efforts. He has agreed to on the Category 4 Plan.   He will work on meal planning and intentional eating.  We discussed the following exercise goals today: For substantial health benefits, adults should do at least 150 minutes (2 hours and 30 minutes) a week of moderate-intensity, or 75 minutes (1 hour and 15 minutes) a week of vigorous-intensity aerobic physical activity, or an equivalent combination of moderate- and vigorous-intensity aerobic activity. Aerobic activity  should be performed in episodes of at least 10 minutes, and preferably, it should be spread throughout the week. Adults should also include muscle-strengthening activities that involve all major muscle groups on 2 or more days a week.  We discussed the following behavioral modification strategies  today: increasing lean protein intake, decreasing simple carbohydrates, increasing vegetables, increasing water intake, decreasing eating out, no skipping meals, meal planning and cooking strategies, keeping healthy foods in the home and planning for success.  Cameron Valenzuela has agreed to follow-up with our clinic in 2 weeks. He was informed of the importance of frequent follow-up visits to maximize his success with intensive lifestyle modifications for his multiple health conditions.   Cameron Valenzuela was informed we would discuss his lab results at his next visit unless there is a critical issue that needs to be addressed sooner. Cameron Valenzuela agreed to keep his next visit at the agreed upon time to discuss these results.  Objective:   Blood pressure 121/73, pulse 66, temperature 98.2 F (36.8 C), height 5\' 10"  (1.778 m), weight 215 lb (97.5 kg), SpO2 98 %. Body mass index is 30.85 kg/m.  General: Cooperative, alert, well developed, in no acute distress. HEENT: Conjunctivae and lids unremarkable. Neck: No thyromegaly.  Cardiovascular: Regular rhythm.  Lungs: Normal work of breathing. Extremities: No edema.  Neurologic: No focal deficits.   Lab Results  Component Value Date   CREATININE 0.96 07/27/2019   BUN 17 07/27/2019   NA 140 07/27/2019   K 4.6 07/27/2019   CL 101 07/27/2019   CO2 26 07/27/2019   Lab Results  Component Value Date   ALT 22 07/27/2019   AST 18 07/27/2019   ALKPHOS 76 07/27/2019   BILITOT 0.4 07/27/2019   Lab Results  Component Value Date   HGBA1C 5.1 07/27/2019   HGBA1C 5.0 02/11/2019   HGBA1C 6.2 (H) 09/18/2018   HGBA1C 5.5 06/24/2018   HGBA1C 5.6 06/28/2017   Lab Results  Component Value Date   INSULIN 22.7 07/27/2019   INSULIN 16.4 02/11/2019   INSULIN 60.2 (H) 09/18/2018   Lab Results  Component Value Date   TSH 3.960 09/18/2018   Lab Results  Component Value Date   CHOL 232 (H) 07/27/2019   HDL 32 (L) 07/27/2019   LDLCALC 157 (H) 07/27/2019    LDLDIRECT 116.0 06/24/2018   TRIG 234 (H) 07/27/2019   CHOLHDL 8 06/24/2018   Lab Results  Component Value Date   WBC 5.4 09/18/2018   HGB 15.1 09/18/2018   HCT 41.5 09/18/2018   MCV 86 09/18/2018   PLT 176.0 06/24/2018   No results found for: IRON, TIBC, FERRITIN  Attestation Statements:   Reviewed by clinician on day of visit: allergies, medications, problem list, medical history, surgical history, family history, social history, and previous encounter notes.   Migdalia Dk, am acting as Location manager for [employer], DO  I have reviewed the above documentation for accuracy and completeness, and I agree with the above. Jearld Lesch, DO

## 2019-07-29 ENCOUNTER — Encounter (INDEPENDENT_AMBULATORY_CARE_PROVIDER_SITE_OTHER): Payer: Self-pay | Admitting: Bariatrics

## 2019-08-07 ENCOUNTER — Encounter: Payer: Self-pay | Admitting: Internal Medicine

## 2019-08-07 ENCOUNTER — Ambulatory Visit: Payer: BC Managed Care – PPO | Admitting: Internal Medicine

## 2019-08-07 ENCOUNTER — Ambulatory Visit (HOSPITAL_BASED_OUTPATIENT_CLINIC_OR_DEPARTMENT_OTHER)
Admission: RE | Admit: 2019-08-07 | Discharge: 2019-08-07 | Disposition: A | Discharge status: Home / Self Care | Payer: BC Managed Care – PPO | Source: Ambulatory Visit | Attending: Internal Medicine | Admitting: Internal Medicine

## 2019-08-07 ENCOUNTER — Other Ambulatory Visit: Payer: Self-pay

## 2019-08-07 VITALS — BP 111/67 | HR 74 | Temp 97.8°F | Resp 16 | Ht 70.0 in | Wt 218.5 lb

## 2019-08-07 DIAGNOSIS — Z0000 Encounter for general adult medical examination without abnormal findings: Secondary | ICD-10-CM

## 2019-08-07 DIAGNOSIS — R079 Chest pain, unspecified: Secondary | ICD-10-CM

## 2019-08-07 DIAGNOSIS — E785 Hyperlipidemia, unspecified: Secondary | ICD-10-CM

## 2019-08-07 NOTE — Progress Notes (Signed)
Pre visit review using our clinic review tool, if applicable. No additional management support is needed unless otherwise documented below in the visit note. 

## 2019-08-07 NOTE — Patient Instructions (Addendum)
  GO TO THE FRONT DESK Schedule your next appointment   for a physical exam in 1 year    STOP BY THE FIRST FLOOR:  get the XR     [email or web address]

## 2019-08-07 NOTE — Progress Notes (Signed)
Subjective:    Patient ID: Cameron Valenzuela, male    DOB: 04/15/1971, 49 y.o.   MRN: [id number]  DOS:  08/07/2019 Type of visit - description: CPX In general feeling well. Few days ago experience right upper side chest pain with a deep breath, pain lasted few hours, decreased with Tylenol. No substernal chest pain, no fever chills, no cough or chest congestion. He is somewhat concerned due to his History of COVID-19.  Wt Readings from Last 3 Encounters:  08/07/19 218 lb 8 oz (99.1 kg)  07/27/19 215 lb (97.5 kg)  07/27/19 215 lb (97.5 kg)     Review of Systems  Other than above, a 14 point review of systems is negative      Past Medical History:  Diagnosis Date  . Allergic rhinitis   . Allergy    seasonal  . Family history of adverse reaction to anesthesia    mother slow to wake up   . Fatigue   . History of kidney stones   . Kidney stones   . Knee pain, right   . Lower extremity edema   . Pinched nerve in neck   . Rosacea   . 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 Sleep apnea     Past Surgical History:  Procedure Laterality Date  . Cystocopy     Dr. Karsten Ro  . CYSTOSCOPY WITH STENT PLACEMENT Left 02/18/2017   Procedure: CYSTOSCOPY/ LEFT RETROGRADE/ WITH LEFT STENT PLACEMENT;  Surgeon: Kathie Rhodes, MD;  Location: [facility];  Service: Urology;  Laterality: Left;  . EXTRACORPOREAL SHOCK WAVE LITHOTRIPSY Left 02/18/2017   Procedure: LEFT EXTRACORPOREAL SHOCK WAVE LITHOTRIPSY (ESWL);  Surgeon: Franchot Gallo, MD;  Location: [facility];  Service: Urology;  Laterality: Left;  . EXTRACORPOREAL SHOCK WAVE LITHOTRIPSY Left 04/11/2017   Procedure: LEFT EXTRACORPOREAL SHOCK WAVE LITHOTRIPSY (ESWL);  Surgeon: Irine Seal, MD;  Location: [facility];  Service: Urology;  Laterality: Left;  . LITHOTRIPSY     02/18/17  . neck bipsy     beningn, in his 74s   Family History  Problem Relation Age of Onset  .  Diabetes Father   . COPD Father   . Heart disease Father        ?  . Stroke Father   . Sleep apnea Father   . Obesity Father   . Heart disease Maternal Grandfather   . Heart disease Paternal Grandmother   . Diabetes Other        many fam members   . Hyperlipidemia Mother        M and others  . Cancer Mother   . Colon cancer Neg Hx   . Prostate cancer Neg Hx          Objective:   Physical Exam BP 111/67 (BP Location: Left Arm, Patient Position: Sitting, Cuff Size: Normal)   Pulse 74   Temp 97.8 F (36.6 C) (Temporal)   Resp 16   Ht 5\' 10"  (1.778 m)   Wt 218 lb 8 oz (99.1 kg)   SpO2 99%   BMI 31.35 kg/m  General: Well developed, NAD, BMI noted Neck: No  thyromegaly  HEENT:  Normocephalic . Face symmetric, atraumatic Lungs:  CTA B Normal respiratory effort, no intercostal retractions, no accessory muscle use. Heart: RRR,  no murmur.  No pretibial edema bilaterally  Abdomen:  Not distended, soft, non-tender. No rebound or rigidity.  Skin: Exposed areas without rash. Not pale. Not jaundice Neurologic:  alert & oriented X3.  Speech normal, gait appropriate for age and unassisted Strength symmetric and appropriate for age.  Psych: Cognition and judgment appear intact.  Cooperative with normal attention span and concentration.  Behavior appropriate. No anxious or depressed appearing.     Assessment    Assessment Pre diabetes Seasonal allergies Rosacea Kidney stones ([facility]) OSA per sleep 2018 , has a CPAP  SCC dx 2019  PLAN: Here for CPX Today we review his recent labs. His cholesterol was slightly elevated but his 10-year CV RF is only 4.9%.  At this point he does not need statins. Obesity: Plans to see the wellness clinic soon.  He gained some weight during the Christmas season but is ready to get back in track, he is also taking Wellbutrin to help with appetite control. Chest pain: As described above, likely episodic/benign pain, patient  somewhat concerned about history of Covid few months ago, will check a chest x-ray. RTC 1 year.      This visit occurred during the SARS-CoV-2 public health emergency.  Safety protocols were in place, including screening questions prior to the visit, additional usage of staff PPE, and extensive cleaning of exam room while observing appropriate contact time as indicated for disinfecting solutions.

## 2019-08-08 NOTE — Assessment & Plan Note (Signed)
Here for CPX Today we review his recent labs. His cholesterol was slightly elevated but his 10-year CV RF is only 4.9%.  At this point he does not need statins. Obesity: Plans to see the wellness clinic soon.  He gained some weight during the Christmas season but is ready to get back in track, he is also taking Wellbutrin to help with appetite control. Chest pain: As described above, likely episodic/benign pain, patient somewhat concerned about history of Covid few months ago, will check a chest x-ray. RTC 1 year.

## 2019-08-08 NOTE — Assessment & Plan Note (Signed)
-  Td 2016; flu shot 03/2019 -CCS:  no previous colonoscopy, no FH, 3 options reviewed, we agreed on an IFOB -Prostate cancer screening: Not indicated -Diet-exercise: Discussed   -Labs: Recent labs reviewed.  At some point will need to recheck a TSH.  Vitamin D is low, will start supplements.

## 2019-08-10 ENCOUNTER — Telehealth (INDEPENDENT_AMBULATORY_CARE_PROVIDER_SITE_OTHER): Payer: BC Managed Care – PPO | Admitting: Physician Assistant

## 2019-08-10 ENCOUNTER — Other Ambulatory Visit: Payer: Self-pay

## 2019-08-10 ENCOUNTER — Encounter (INDEPENDENT_AMBULATORY_CARE_PROVIDER_SITE_OTHER): Payer: Self-pay | Admitting: Physician Assistant

## 2019-08-10 DIAGNOSIS — Z6830 Body mass index (BMI) 30.0-30.9, adult: Secondary | ICD-10-CM | POA: Diagnosis not present

## 2019-08-10 DIAGNOSIS — E66811 Obesity, class 1: Secondary | ICD-10-CM

## 2019-08-10 DIAGNOSIS — E669 Obesity, unspecified: Secondary | ICD-10-CM

## 2019-08-10 DIAGNOSIS — E559 Vitamin D deficiency, unspecified: Secondary | ICD-10-CM

## 2019-08-10 MED ORDER — VITAMIN D (ERGOCALCIFEROL) 1.25 MG (50000 UNIT) PO CAPS: 50000.0000 [IU] | ORAL_CAPSULE | ORAL | 0 refills | Status: DC

## 2019-08-10 NOTE — Progress Notes (Signed)
TeleHealth Visit:  Due to the COVID-19 pandemic, this visit was completed with telemedicine (audio/video) technology to reduce patient and provider exposure as well as to preserve personal protective equipment.   Cameron Valenzuela has verbally consented to this TeleHealth visit. The patient is located at work, the provider is located at the [facility] office. The participants in this visit include the listed provider and patient. The visit was conducted today via Webex.  Chief Complaint: OBESITY Cameron Valenzuela is here to discuss his progress with his obesity treatment plan along with follow-up of his obesity related diagnoses. Cameron Valenzuela is on the Category 4 Plan and states he is following his eating plan approximately 60% of the time. Cameron Valenzuela states he is exercising 0 minutes 0 times per week.  Today's visit was #: 20 Starting weight: 256 lbs Starting date: 09/18/2018  Interim History: Cameron Valenzuela's most recent weight was 216 lbs. He reports that he started his Wellbutrin last week and hasn't seen a difference in his cravings as of yet. He notes that when he gets home and hasn't meal planned, many times his family has cooked food not on his plan.  Subjective:   Vitamin D deficiency. Last Vitamin D level was 20.8 on 07/27/2019. He is on prescription Vitamin D every 14 days. No nausea, vomiting, or muscle weakness.  Assessment/Plan:   Vitamin D deficiency. Low Vitamin D level contributes to fatigue and are associated with obesity, breast, and colon cancer. His prescription Vitamin D dose was increased to 50,000 IU every week #4 with 0 refills and will follow-up for routine testing of Vitamin D, at least 2-3 times per year to avoid over-replacement.   Class 1 obesity with serious comorbidity and body mass index (BMI) of 30.0 to 30.9 in adult, unspecified obesity type.  Cameron Valenzuela is currently in the action stage of change. As such, his goal is to continue with weight loss efforts. He has  agreed to the Category 4 Plan.   Exercise goals: For substantial health benefits, adults should do at least 150 minutes (2 hours and 30 minutes) a week of moderate-intensity, or 75 minutes (1 hour and 15 minutes) a week of vigorous-intensity aerobic physical activity, or an equivalent combination of moderate- and vigorous-intensity aerobic activity. Aerobic activity should be performed in episodes of at least 10 minutes, and preferably, it should be spread throughout the week. Adults should also include muscle-strengthening activities that involve all major muscle groups on 2 or more days a week.  Behavioral modification strategies: meal planning and cooking strategies and keeping healthy foods in the home.  Cameron Valenzuela has agreed to follow-up with our clinic in 2 weeks. He was informed of the importance of frequent follow-up visits to maximize his success with intensive lifestyle modifications for his multiple health conditions.  Objective:   VITALS: Per patient if applicable, see vitals. GENERAL: Alert and in no acute distress. CARDIOPULMONARY: No increased WOB. Speaking in clear sentences.  PSYCH: Pleasant and cooperative. Speech normal rate and rhythm. Affect is appropriate. Insight and judgement are appropriate. Attention is focused, linear, and appropriate.  NEURO: Oriented as arrived to appointment on time with no prompting.   Lab Results  Component Value Date   CREATININE 0.96 07/27/2019   BUN 17 07/27/2019   NA 140 07/27/2019   K 4.6 07/27/2019   CL 101 07/27/2019   CO2 26 07/27/2019   Lab Results  Component Value Date   ALT 22 07/27/2019   AST 18 07/27/2019   ALKPHOS 76  07/27/2019   BILITOT 0.4 07/27/2019   Lab Results  Component Value Date   HGBA1C 5.1 07/27/2019   HGBA1C 5.0 02/11/2019   HGBA1C 6.2 (H) 09/18/2018   HGBA1C 5.5 06/24/2018   HGBA1C 5.6 06/28/2017   Lab Results  Component Value Date   INSULIN 22.7 07/27/2019   INSULIN 16.4 02/11/2019   INSULIN 60.2 (H)  09/18/2018   Lab Results  Component Value Date   TSH 3.960 09/18/2018   Lab Results  Component Value Date   CHOL 232 (H) 07/27/2019   HDL 32 (L) 07/27/2019   LDLCALC 157 (H) 07/27/2019   LDLDIRECT 116.0 06/24/2018   TRIG 234 (H) 07/27/2019   CHOLHDL 8 06/24/2018   Lab Results  Component Value Date   WBC 5.4 09/18/2018   HGB 15.1 09/18/2018   HCT 41.5 09/18/2018   MCV 86 09/18/2018   PLT 176.0 06/24/2018   No results found for: IRON, TIBC, FERRITIN  Attestation Statements:   Reviewed by clinician on day of visit: allergies, medications, problem list, medical history, surgical history, family history, social history, and previous encounter notes.  Time spent on visit including pre-visit chart review and post-visit care was 32 minutes.   IMichaelene Song, am acting as transcriptionist for Abby Potash, PA-C   I have reviewed the above documentation for accuracy and completeness, and I agree with the above. Abby Potash, PA-C

## 2019-08-26 ENCOUNTER — Encounter (INDEPENDENT_AMBULATORY_CARE_PROVIDER_SITE_OTHER): Payer: Self-pay | Admitting: Physician Assistant

## 2019-08-26 ENCOUNTER — Other Ambulatory Visit: Payer: Self-pay

## 2019-08-26 ENCOUNTER — Telehealth (INDEPENDENT_AMBULATORY_CARE_PROVIDER_SITE_OTHER): Payer: BC Managed Care – PPO | Admitting: Physician Assistant

## 2019-08-26 DIAGNOSIS — F3289 Other specified depressive episodes: Secondary | ICD-10-CM

## 2019-08-26 DIAGNOSIS — E669 Obesity, unspecified: Secondary | ICD-10-CM

## 2019-08-26 DIAGNOSIS — E8881 Metabolic syndrome: Secondary | ICD-10-CM | POA: Diagnosis not present

## 2019-08-26 DIAGNOSIS — Z6831 Body mass index (BMI) 31.0-31.9, adult: Secondary | ICD-10-CM | POA: Diagnosis not present

## 2019-08-26 MED ORDER — METFORMIN HCL 500 MG PO TABS: 500.0000 mg | ORAL_TABLET | Freq: Two times a day (BID) | ORAL | 0 refills | Status: DC

## 2019-08-26 MED ORDER — BUPROPION HCL ER (SR) 200 MG PO TB12: 200.0000 mg | ORAL_TABLET | Freq: Every day | ORAL | 0 refills | Status: DC

## 2019-08-26 NOTE — Progress Notes (Signed)
TeleHealth Visit:  Due to the COVID-19 pandemic, this visit was completed with telemedicine (audio/video) technology to reduce patient and provider exposure as well as to preserve personal protective equipment.   Cameron Valenzuela has verbally consented to this TeleHealth visit. The patient is located at work, the provider is located at the [facility] office. The participants in this visit include the listed provider and patient. The visit was conducted today via Webex.  Chief Complaint: OBESITY [name] is here to discuss his progress with his obesity treatment plan along with follow-up of his obesity related diagnoses. Cameron Valenzuela is on the Category 4 Plan and states he is following his eating plan approximately 75% of the time. [name] states he is exercising 0 minutes 0 times per week.  Today's visit was #: 21 Starting weight: 256 lbs Starting date: 09/18/2018  Interim History: Cameron Valenzuela's most recent weight was 215 lbs. He states that dinner is the toughest meal of the day to stay on plan if he does not meal plan.  Subjective:   Insulin resistance. Cameron Valenzuela has a diagnosis of insulin resistance based on his elevated fasting insulin level >5. He continues to work on diet and exercise to decrease his risk of diabetes. Cameron Valenzuela is on metformin twice daily. No nausea, vomiting, or diarrhea.   Lab Results  Component Value Date   INSULIN 22.7 07/27/2019   INSULIN 16.4 02/11/2019   INSULIN 60.2 (H) 09/18/2018   Lab Results  Component Value Date   HGBA1C 5.1 07/27/2019   Other depression - with emotional eating. Cameron Valenzuela is struggling with emotional eating and using food for comfort to the extent that it is negatively impacting his health. He has been working on behavior modification techniques to help reduce his emotional eating and has been somewhat successful. He shows no sign of suicidal or homicidal ideations. Cameron Valenzuela is on Wellbutrin 150 mg. He reports that Wellbutrin is  not yet helping with cravings.   Assessment/Plan:   Insulin resistance. Hero will continue to work on weight loss, exercise, and decreasing simple carbohydrates to help decrease the risk of diabetes. Paco agreed to follow-up with [place] as directed to closely monitor his progress. He was given a refill on his metFORMIN (GLUCOPHAGE) 500 MG tablet #60 with 0 refills.  Other depression - with emotional eating. Behavior modification techniques were discussed today to help Cameron Valenzuela deal with his emotional/non-hunger eating behaviors.  Orders and follow up as documented in patient record. Ulysis was given a prescription for increased dose of buPROPion (WELLBUTRIN SR) to 200 MG 12 hr tablet 1 PO daily #30 with 0 refills.  Class 1 obesity with serious comorbidity and body mass index (BMI) of 31.0 to 31.9 in adult, unspecified obesity type.  Cameron Valenzuela is currently in the action stage of change. As such, his goal is to continue with weight loss efforts. He has agreed to the Category 4 Plan.   Exercise goals: For substantial health benefits, adults should do at least 150 minutes (2 hours and 30 minutes) a week of moderate-intensity, or 75 minutes (1 hour and 15 minutes) a week of vigorous-intensity aerobic physical activity, or an equivalent combination of moderate- and vigorous-intensity aerobic activity. Aerobic activity should be performed in episodes of at least 10 minutes, and preferably, it should be spread throughout the week.  Behavioral modification strategies: meal planning and cooking strategies and keeping healthy foods in the home.  Cameron Valenzuela has agreed to follow-up with our clinic in 2-3 weeks. He was informed of  the importance of frequent follow-up visits to maximize his success with intensive lifestyle modifications for his multiple health conditions.  Objective:   VITALS: Per patient if applicable, see vitals. GENERAL: Alert and in no acute distress. CARDIOPULMONARY: No increased WOB.  Speaking in clear sentences.  PSYCH: Pleasant and cooperative. Speech normal rate and rhythm. Affect is appropriate. Insight and judgement are appropriate. Attention is focused, linear, and appropriate.  NEURO: Oriented as arrived to appointment on time with no prompting.   Lab Results  Component Value Date   CREATININE 0.96 07/27/2019   BUN 17 07/27/2019   NA 140 07/27/2019   K 4.6 07/27/2019   CL 101 07/27/2019   CO2 26 07/27/2019   Lab Results  Component Value Date   ALT 22 07/27/2019   AST 18 07/27/2019   ALKPHOS 76 07/27/2019   BILITOT 0.4 07/27/2019   Lab Results  Component Value Date   HGBA1C 5.1 07/27/2019   HGBA1C 5.0 02/11/2019   HGBA1C 6.2 (H) 09/18/2018   HGBA1C 5.5 06/24/2018   HGBA1C 5.6 06/28/2017   Lab Results  Component Value Date   INSULIN 22.7 07/27/2019   INSULIN 16.4 02/11/2019   INSULIN 60.2 (H) 09/18/2018   Lab Results  Component Value Date   TSH 3.960 09/18/2018   Lab Results  Component Value Date   CHOL 232 (H) 07/27/2019   HDL 32 (L) 07/27/2019   LDLCALC 157 (H) 07/27/2019   LDLDIRECT 116.0 06/24/2018   TRIG 234 (H) 07/27/2019   CHOLHDL 8 06/24/2018   Lab Results  Component Value Date   WBC 5.4 09/18/2018   HGB 15.1 09/18/2018   HCT 41.5 09/18/2018   MCV 86 09/18/2018   PLT 176.0 06/24/2018   No results found for: IRON, TIBC, FERRITIN  Attestation Statements:   Reviewed by clinician on day of visit: allergies, medications, problem list, medical history, surgical history, family history, social history, and previous encounter notes.  IMichaelene Song, am acting as transcriptionist for Abby Potash, PA-C   I have reviewed the above documentation for accuracy and completeness, and I agree with the above. Abby Potash, PA-C

## 2019-09-03 ENCOUNTER — Other Ambulatory Visit (INDEPENDENT_AMBULATORY_CARE_PROVIDER_SITE_OTHER): Payer: Self-pay | Admitting: Physician Assistant

## 2019-09-03 DIAGNOSIS — E559 Vitamin D deficiency, unspecified: Secondary | ICD-10-CM

## 2019-09-16 ENCOUNTER — Encounter (INDEPENDENT_AMBULATORY_CARE_PROVIDER_SITE_OTHER): Payer: Self-pay | Admitting: Physician Assistant

## 2019-09-16 ENCOUNTER — Other Ambulatory Visit: Payer: Self-pay

## 2019-09-16 ENCOUNTER — Ambulatory Visit (INDEPENDENT_AMBULATORY_CARE_PROVIDER_SITE_OTHER): Payer: BC Managed Care – PPO | Admitting: Physician Assistant

## 2019-09-16 VITALS — BP 126/76 | HR 72 | Temp 98.3°F | Ht 70.0 in | Wt 212.0 lb

## 2019-09-16 DIAGNOSIS — E559 Vitamin D deficiency, unspecified: Secondary | ICD-10-CM

## 2019-09-16 DIAGNOSIS — F3289 Other specified depressive episodes: Secondary | ICD-10-CM

## 2019-09-16 DIAGNOSIS — E8881 Metabolic syndrome: Secondary | ICD-10-CM | POA: Diagnosis not present

## 2019-09-16 DIAGNOSIS — Z9189 Other specified personal risk factors, not elsewhere classified: Secondary | ICD-10-CM | POA: Diagnosis not present

## 2019-09-16 DIAGNOSIS — Z6830 Body mass index (BMI) 30.0-30.9, adult: Secondary | ICD-10-CM

## 2019-09-16 DIAGNOSIS — E669 Obesity, unspecified: Secondary | ICD-10-CM

## 2019-09-16 MED ORDER — VITAMIN D (ERGOCALCIFEROL) 1.25 MG (50000 UNIT) PO CAPS: 50000.0000 [IU] | ORAL_CAPSULE | ORAL | 0 refills | Status: DC

## 2019-09-16 MED ORDER — BUPROPION HCL ER (SR) 200 MG PO TB12: 200.0000 mg | ORAL_TABLET | Freq: Every day | ORAL | 0 refills | Status: DC

## 2019-09-16 MED ORDER — METFORMIN HCL 500 MG PO TABS: 500.0000 mg | ORAL_TABLET | Freq: Two times a day (BID) | ORAL | 0 refills | Status: DC

## 2019-09-16 NOTE — Progress Notes (Signed)
Chief Complaint:   OBESITY Cameron Valenzuela is here to discuss his progress with his obesity treatment plan along with follow-up of his obesity related diagnoses. Cameron Valenzuela is on the Category 4 Plan and states he is following his eating plan approximately 50% of the time. Cameron Valenzuela states he is walking for 15-20 minutes 2 times per week.  Today's visit was #: 22 Starting weight: 256 lbs Starting date: 09/18/2018 Today's weight: 212 lbs Today's date: 09/16/2019 Total lbs lost to date: 44 lbs Total lbs lost since last in-office visit: 3 lbs  Interim History: Cameron Valenzuela states that he recently started exercising again.  He continues to struggle with dinner due to eating with family.  He is not weighing his protein at dinner.  Subjective:   1. Vitamin D deficiency Cameron Valenzuela's Vitamin D level was 20.8 on 07/27/2019. He is currently taking vit D. He denies nausea, vomiting or muscle weakness.  He is walking outside twice weekly.  2. Insulin resistance Cameron Valenzuela has a diagnosis of insulin resistance based on his elevated fasting insulin level >5. He continues to work on diet and exercise to decrease his risk of diabetes.  He is taking metformin 500 mg twice daily.  No nausea, vomiting, or diarrhea.  No polyphagia.  Lab Results  Component Value Date   INSULIN 22.7 07/27/2019   INSULIN 16.4 02/11/2019   INSULIN 60.2 (H) 09/18/2018   Lab Results  Component Value Date   HGBA1C 5.1 07/27/2019   3. Other depression Cameron Valenzuela is struggling with emotional eating and using food for comfort to the extent that it is negatively impacting his health. He has been working on behavior modification techniques to help reduce his emotional eating and has been somewhat successful. He shows no sign of suicidal or homicidal ideations.  He is taking bupropion 200 mg daily.  His blood pressure is normal.  He reports that this helps with medications.  4. At risk for diabetes mellitus Cameron Valenzuela is at higher than average risk for  developing diabetes due to his obesity.   Assessment/Plan:   1. Vitamin D deficiency Low Vitamin D level contributes to fatigue and are associated with obesity, breast, and colon cancer. He agrees to continue to take prescription Vitamin D @50 ,000 IU every week and will follow-up for routine testing of Vitamin D, at least 2-3 times per year to avoid over-replacement. - Vitamin D, Ergocalciferol, (DRISDOL) 1.25 MG (50000 UNIT) CAPS capsule; Take 1 capsule (50,000 Units total) by mouth every 7 (seven) days.  Dispense: 4 capsule; Refill: 0  2. Insulin resistance Cameron Valenzuela will continue to work on weight loss, exercise, and decreasing simple carbohydrates to help decrease the risk of diabetes. Cameron Valenzuela agreed to follow-up with [place] as directed to closely monitor his progress. - metFORMIN (GLUCOPHAGE) 500 MG tablet; Take 1 tablet (500 mg total) by mouth 2 (two) times daily with a meal.  Dispense: 60 tablet; Refill: 0  3. Other depression Behavior modification techniques were discussed today to help Cameron Valenzuela deal with his emotional/non-hunger eating behaviors.  Orders and follow up as documented in patient record.  - buPROPion (WELLBUTRIN SR) 200 MG 12 hr tablet; Take 1 tablet (200 mg total) by mouth daily.  Dispense: 30 tablet; Refill: 0  4. At risk for diabetes mellitus Cameron Valenzuela was given approximately 15 minutes of diabetes education and counseling today. We discussed intensive lifestyle modifications today with an emphasis on weight loss as well as increasing exercise and decreasing simple carbohydrates in his diet. We also reviewed medication options  with an emphasis on risk versus benefit of those discussed.   Repetitive spaced learning was employed today to elicit superior memory formation and behavioral change.  5. Class 1 obesity with serious comorbidity and body mass index (BMI) of 30.0 to 30.9 in adult, unspecified obesity type Cameron Valenzuela is currently in the action stage of change. As such, his goal  is to continue with weight loss efforts. He has agreed to the Category 4 Plan.   Exercise goals: As is.  Behavioral modification strategies: meal planning and cooking strategies and better snacking choices.  Cameron Valenzuela has agreed to follow-up with our clinic in 3 weeks. He was informed of the importance of frequent follow-up visits to maximize his success with intensive lifestyle modifications for his multiple health conditions.   Objective:   Blood pressure 126/76, pulse 72, temperature 98.3 F (36.8 C), temperature source Oral, height 5\' 10"  (1.778 m), weight 212 lb (96.2 kg), SpO2 100 %. Body mass index is 30.42 kg/m.  General: Cooperative, alert, well developed, in no acute distress. HEENT: Conjunctivae and lids unremarkable. Cardiovascular: Regular rhythm.  Lungs: Normal work of breathing. Neurologic: No focal deficits.   Lab Results  Component Value Date   CREATININE 0.96 07/27/2019   BUN 17 07/27/2019   NA 140 07/27/2019   K 4.6 07/27/2019   CL 101 07/27/2019   CO2 26 07/27/2019   Lab Results  Component Value Date   ALT 22 07/27/2019   AST 18 07/27/2019   ALKPHOS 76 07/27/2019   BILITOT 0.4 07/27/2019   Lab Results  Component Value Date   HGBA1C 5.1 07/27/2019   HGBA1C 5.0 02/11/2019   HGBA1C 6.2 (H) 09/18/2018   HGBA1C 5.5 06/24/2018   HGBA1C 5.6 06/28/2017   Lab Results  Component Value Date   INSULIN 22.7 07/27/2019   INSULIN 16.4 02/11/2019   INSULIN 60.2 (H) 09/18/2018   Lab Results  Component Value Date   TSH 3.960 09/18/2018   Lab Results  Component Value Date   CHOL 232 (H) 07/27/2019   HDL 32 (L) 07/27/2019   LDLCALC 157 (H) 07/27/2019   LDLDIRECT 116.0 06/24/2018   TRIG 234 (H) 07/27/2019   CHOLHDL 8 06/24/2018   Lab Results  Component Value Date   WBC 5.4 09/18/2018   HGB 15.1 09/18/2018   HCT 41.5 09/18/2018   MCV 86 09/18/2018   PLT 176.0 06/24/2018   Attestation Statements:   Reviewed by clinician on day of visit: allergies,  medications, problem list, medical history, surgical history, family history, social history, and previous encounter notes.  I, Water quality scientist, CMA, am acting as Location manager for [employer], PA-C.  I have reviewed the above documentation for accuracy and completeness, and I agree with the above. Abby Potash, PA-C

## 2019-10-07 ENCOUNTER — Telehealth (INDEPENDENT_AMBULATORY_CARE_PROVIDER_SITE_OTHER): Payer: BC Managed Care – PPO | Admitting: Bariatrics

## 2019-10-07 ENCOUNTER — Ambulatory Visit (INDEPENDENT_AMBULATORY_CARE_PROVIDER_SITE_OTHER): Payer: BC Managed Care – PPO | Admitting: Physician Assistant

## 2019-10-07 ENCOUNTER — Other Ambulatory Visit: Payer: Self-pay

## 2019-10-07 DIAGNOSIS — Z6830 Body mass index (BMI) 30.0-30.9, adult: Secondary | ICD-10-CM | POA: Diagnosis not present

## 2019-10-07 DIAGNOSIS — E669 Obesity, unspecified: Secondary | ICD-10-CM

## 2019-10-07 DIAGNOSIS — E8881 Metabolic syndrome: Secondary | ICD-10-CM

## 2019-10-07 DIAGNOSIS — F3289 Other specified depressive episodes: Secondary | ICD-10-CM | POA: Diagnosis not present

## 2019-10-07 NOTE — Progress Notes (Signed)
TeleHealth Visit:  Due to the COVID-19 pandemic, this visit was completed with telemedicine (audio/video) technology to reduce patient and provider exposure as well as to preserve personal protective equipment.   Cameron Valenzuela has verbally consented to this TeleHealth visit. The patient is located at the office, the provider is located at the [facility] office. The participants in this visit include the listed provider and patient. The visit was conducted today via audio.  Cameron Valenzuela was unable to use realtime audiovisual technology today and the telehealth visit was conducted via telephone.  Chief Complaint: OBESITY Cameron Valenzuela is here to discuss his progress with his obesity treatment plan along with follow-up of his obesity related diagnoses. Cameron Valenzuela is on the Category 4 Plan and states he is following his eating plan approximately 70% of the time. Cameron Valenzuela states he is walking 1 mile 2 times per week.  Today's visit was #: 23 Starting weight: 256 lbs Starting date: 09/18/2018  Interim History: Cameron Valenzuela has maintained his weight. He is doing well overall.  Subjective:   Other depression, with emotional eating. Cameron Valenzuela is struggling with emotional eating and using food for comfort to the extent that it is negatively impacting his health. He has been working on behavior modification techniques to help reduce his emotional eating and has been somewhat successful. He shows no sign of suicidal or homicidal ideations. Cameron Valenzuela does not think Wellbutrin is helping. He reports occasional stress eating.  Insulin resistance. Jacier has a diagnosis of insulin resistance based on his elevated fasting insulin level >5. He continues to work on diet and exercise to decrease his risk of diabetes. He is taking Glucophage. He reports having a normal appetite.  Lab Results  Component Value Date   INSULIN 22.7 07/27/2019   INSULIN 16.4 02/11/2019   INSULIN 60.2 (H) 09/18/2018   Lab  Results  Component Value Date   HGBA1C 5.1 07/27/2019   Assessment/Plan:   Other depression, with emotional eating. Behavior modification techniques were discussed today to help Shigeki deal with his emotional/non-hunger eating behaviors.  Orders and follow up as documented in patient record. Cameron Valenzuela will stop Wellbutrin for several days to see if it is helping.  Insulin resistance. Cameron Valenzuela will continue to work on weight loss, exercise, and decreasing simple carbohydrates to help decrease the risk of diabetes. Cameron Valenzuela agreed to follow-up with [place] as directed to closely monitor his progress. He will continue taking metformin as directed.  Class 1 obesity with serious comorbidity and body mass index (BMI) of 30.0 to 30.9 in adult, unspecified obesity type.  Cameron Valenzuela is currently in the action stage of change. As such, his goal is to continue with weight loss efforts. He has agreed to the Category 4 Plan.  He will work on meal preparation, intentional eating, and will not have tempting foods in the house.   Exercise goals: Cameron Valenzuela will be more active and continue walking.  Behavioral modification strategies: increasing lean protein intake, decreasing simple carbohydrates, increasing vegetables, increasing water intake, decreasing liquid calories, decreasing eating out, no skipping meals, meal planning and cooking strategies, keeping healthy foods in the home and planning for success.  Cameron Valenzuela has agreed to follow-up with our clinic in 2 weeks. He was informed of the importance of frequent follow-up visits to maximize his success with intensive lifestyle modifications for his multiple health conditions.  Objective:   VITALS: Per patient if applicable, see vitals. GENERAL: Alert and in no acute distress. CARDIOPULMONARY: No increased WOB. Speaking in clear sentences.  PSYCH: Pleasant and cooperative. Speech normal rate and rhythm. Affect is appropriate. Insight and judgement are appropriate.  Attention is focused, linear, and appropriate.  NEURO: Oriented as arrived to appointment on time with no prompting.   Lab Results  Component Value Date   CREATININE 0.96 07/27/2019   BUN 17 07/27/2019   NA 140 07/27/2019   K 4.6 07/27/2019   CL 101 07/27/2019   CO2 26 07/27/2019   Lab Results  Component Value Date   ALT 22 07/27/2019   AST 18 07/27/2019   ALKPHOS 76 07/27/2019   BILITOT 0.4 07/27/2019   Lab Results  Component Value Date   HGBA1C 5.1 07/27/2019   HGBA1C 5.0 02/11/2019   HGBA1C 6.2 (H) 09/18/2018   HGBA1C 5.5 06/24/2018   HGBA1C 5.6 06/28/2017   Lab Results  Component Value Date   INSULIN 22.7 07/27/2019   INSULIN 16.4 02/11/2019   INSULIN 60.2 (H) 09/18/2018   Lab Results  Component Value Date   TSH 3.960 09/18/2018   Lab Results  Component Value Date   CHOL 232 (H) 07/27/2019   HDL 32 (L) 07/27/2019   LDLCALC 157 (H) 07/27/2019   LDLDIRECT 116.0 06/24/2018   TRIG 234 (H) 07/27/2019   CHOLHDL 8 06/24/2018   Lab Results  Component Value Date   WBC 5.4 09/18/2018   HGB 15.1 09/18/2018   HCT 41.5 09/18/2018   MCV 86 09/18/2018   PLT 176.0 06/24/2018   No results found for: IRON, TIBC, FERRITIN  Attestation Statements:   Reviewed by clinician on day of visit: allergies, medications, problem list, medical history, surgical history, family history, social history, and previous encounter notes.  Time spent on visit including pre-visit chart review and post-visit charting and care was 20 minutes.   Migdalia Dk, am acting as Location manager for [employer], DO   I have reviewed the above documentation for accuracy and completeness, and I agree with the above. Jearld Lesch, DO

## 2019-10-12 ENCOUNTER — Other Ambulatory Visit (INDEPENDENT_AMBULATORY_CARE_PROVIDER_SITE_OTHER): Payer: Self-pay | Admitting: Physician Assistant

## 2019-10-12 DIAGNOSIS — E559 Vitamin D deficiency, unspecified: Secondary | ICD-10-CM

## 2019-10-19 ENCOUNTER — Other Ambulatory Visit (INDEPENDENT_AMBULATORY_CARE_PROVIDER_SITE_OTHER): Payer: Self-pay | Admitting: Physician Assistant

## 2019-10-19 DIAGNOSIS — E8881 Metabolic syndrome: Secondary | ICD-10-CM

## 2019-10-19 DIAGNOSIS — F3289 Other specified depressive episodes: Secondary | ICD-10-CM

## 2019-10-26 ENCOUNTER — Ambulatory Visit (INDEPENDENT_AMBULATORY_CARE_PROVIDER_SITE_OTHER): Payer: BC Managed Care – PPO | Admitting: Bariatrics

## 2019-10-29 ENCOUNTER — Ambulatory Visit (INDEPENDENT_AMBULATORY_CARE_PROVIDER_SITE_OTHER): Payer: BC Managed Care – PPO | Admitting: Bariatrics

## 2019-10-29 ENCOUNTER — Encounter (INDEPENDENT_AMBULATORY_CARE_PROVIDER_SITE_OTHER): Payer: Self-pay | Admitting: Bariatrics

## 2019-10-29 ENCOUNTER — Other Ambulatory Visit: Payer: Self-pay

## 2019-10-29 VITALS — BP 118/75 | HR 76 | Temp 97.8°F | Ht 70.0 in | Wt 219.0 lb

## 2019-10-29 DIAGNOSIS — E8881 Metabolic syndrome: Secondary | ICD-10-CM | POA: Diagnosis not present

## 2019-10-29 DIAGNOSIS — E559 Vitamin D deficiency, unspecified: Secondary | ICD-10-CM

## 2019-10-29 DIAGNOSIS — Z6831 Body mass index (BMI) 31.0-31.9, adult: Secondary | ICD-10-CM

## 2019-10-29 DIAGNOSIS — E669 Obesity, unspecified: Secondary | ICD-10-CM

## 2019-10-29 DIAGNOSIS — Z9189 Other specified personal risk factors, not elsewhere classified: Secondary | ICD-10-CM

## 2019-10-29 MED ORDER — METFORMIN HCL 500 MG PO TABS: 500.0000 mg | ORAL_TABLET | Freq: Two times a day (BID) | ORAL | 0 refills | Status: DC

## 2019-10-29 MED ORDER — VITAMIN D (ERGOCALCIFEROL) 1.25 MG (50000 UNIT) PO CAPS: 50000.0000 [IU] | ORAL_CAPSULE | ORAL | 0 refills | Status: DC

## 2019-11-02 ENCOUNTER — Encounter (INDEPENDENT_AMBULATORY_CARE_PROVIDER_SITE_OTHER): Payer: Self-pay | Admitting: Bariatrics

## 2019-11-02 NOTE — Progress Notes (Signed)
Chief Complaint:   Cameron Valenzuela is here to discuss his progress with his obesity treatment plan along with follow-up of his obesity related diagnoses. Cameron Valenzuela is on the Category 4 Plan and states he is following his eating plan approximately 75% of the time. Cameron Valenzuela states he is walking 1 mile 4 times per week.  Today's visit was #: 24 Starting weight: 256 lbs Starting date: 09/18/2018 Today's weight: 219 lbs Today's date: 10/29/2019 Total lbs lost to date: 37 Total lbs lost since last in-office visit: 0  Interim History: Cameron Valenzuela is up 7 lbs. He notes stress with increased stress eating. He states he has been getting out and walking during nice weather. He is up 2 lbs in water weight.  He works 12-hour shifts and is tired at night, but energy is good for the most part. He reports he has been eating out more.  Subjective:   Vitamin D deficiency. Cameron Valenzuela is on prescription Vitamin D. Last Vitamin D 20.8 on 07/27/2019.  Insulin resistance. Cameron Valenzuela has a diagnosis of insulin resistance based on his elevated fasting insulin level >5. He continues to work on diet and exercise to decrease his risk of diabetes. He notes increased appetite.  Lab Results  Component Value Date   INSULIN 22.7 07/27/2019   INSULIN 16.4 02/11/2019   INSULIN 60.2 (H) 09/18/2018   Lab Results  Component Value Date   HGBA1C 5.1 07/27/2019   At risk for diabetes mellitus. Cameron Valenzuela is at higher than average risk for developing diabetes due to insulin resistance and dietary changes.   Assessment/Plan:   Vitamin D deficiency. Low Vitamin D level contributes to fatigue and are associated with obesity, breast, and colon cancer. He was given a refill on his Vitamin D, Ergocalciferol, (DRISDOL) 1.25 MG (50000 UNIT) CAPS capsule every week #4 with 0 refills and will follow-up for routine testing of Vitamin D, at least 2-3 times per year to avoid over-replacement.     Insulin resistance. Cameron Valenzuela will  continue to work on weight loss, exercise, and decreasing simple carbohydrates to help decrease the risk of diabetes. Cameron Valenzuela agreed to follow-up with [place] as directed to closely monitor his progress. He was given a refill on his metFORMIN (GLUCOPHAGE) 500 MG tablet BID with meals #60 with 0 refills.  At risk for diabetes mellitus. Cameron Valenzuela was given approximately 15 minutes of diabetes education and counseling today. We discussed intensive lifestyle modifications today with an emphasis on weight loss as well as increasing exercise and decreasing simple carbohydrates in his diet. We also reviewed medication options with an emphasis on risk versus benefit of those discussed.   Repetitive spaced learning was employed today to elicit superior memory formation and behavioral change.  Class 1 obesity with serious comorbidity and body mass index (BMI) of 31.0 to 31.9 in adult, unspecified obesity type.  Cameron Valenzuela is currently in the action stage of change. As such, his goal is to continue with weight loss efforts. He has agreed to the Category 4 Plan.   He will work on meal planning.  Handouts were given today on Smart Fruit, Recipes, Snack Calories, and Category 4 meal plan.  Exercise goals: All adults should avoid inactivity. Some physical activity is better than none, and adults who participate in any amount of physical activity gain some health benefits.  Behavioral modification strategies: increasing lean protein intake, decreasing simple carbohydrates, increasing vegetables and increasing water intake.  Cameron Valenzuela has agreed to follow-up with our clinic in 4 weeks.  He was informed of the importance of frequent follow-up visits to maximize his success with intensive lifestyle modifications for his multiple health conditions.   Objective:   Blood pressure 118/75, pulse 76, temperature 97.8 F (36.6 C), height 5\' 10"  (1.778 m), weight 219 lb (99.3 kg), SpO2 98 %. Body mass index is 31.42  kg/m.  General: Cooperative, alert, well developed, in no acute distress. HEENT: Conjunctivae and lids unremarkable. Cardiovascular: Regular rhythm.  Lungs: Normal work of breathing. Neurologic: No focal deficits.   Lab Results  Component Value Date   CREATININE 0.96 07/27/2019   BUN 17 07/27/2019   NA 140 07/27/2019   K 4.6 07/27/2019   CL 101 07/27/2019   CO2 26 07/27/2019   Lab Results  Component Value Date   ALT 22 07/27/2019   AST 18 07/27/2019   ALKPHOS 76 07/27/2019   BILITOT 0.4 07/27/2019   Lab Results  Component Value Date   HGBA1C 5.1 07/27/2019   HGBA1C 5.0 02/11/2019   HGBA1C 6.2 (H) 09/18/2018   HGBA1C 5.5 06/24/2018   HGBA1C 5.6 06/28/2017   Lab Results  Component Value Date   INSULIN 22.7 07/27/2019   INSULIN 16.4 02/11/2019   INSULIN 60.2 (H) 09/18/2018   Lab Results  Component Value Date   TSH 3.960 09/18/2018   Lab Results  Component Value Date   CHOL 232 (H) 07/27/2019   HDL 32 (L) 07/27/2019   LDLCALC 157 (H) 07/27/2019   LDLDIRECT 116.0 06/24/2018   TRIG 234 (H) 07/27/2019   CHOLHDL 8 06/24/2018   Lab Results  Component Value Date   WBC 5.4 09/18/2018   HGB 15.1 09/18/2018   HCT 41.5 09/18/2018   MCV 86 09/18/2018   PLT 176.0 06/24/2018   No results found for: IRON, TIBC, FERRITIN  Attestation Statements:   Reviewed by clinician on day of visit: allergies, medications, problem list, medical history, surgical history, family history, social history, and previous encounter notes.  Migdalia Dk, am acting as Location manager for [employer], DO   I have reviewed the above documentation for accuracy and completeness, and I agree with the above. Jearld Lesch, DO

## 2019-11-26 ENCOUNTER — Ambulatory Visit (INDEPENDENT_AMBULATORY_CARE_PROVIDER_SITE_OTHER): Payer: BC Managed Care – PPO | Admitting: Family Medicine

## 2019-11-26 ENCOUNTER — Other Ambulatory Visit: Payer: Self-pay

## 2019-11-26 ENCOUNTER — Encounter (INDEPENDENT_AMBULATORY_CARE_PROVIDER_SITE_OTHER): Payer: Self-pay | Admitting: Family Medicine

## 2019-11-26 VITALS — BP 121/75 | HR 82 | Temp 97.5°F | Ht 70.0 in | Wt 216.0 lb

## 2019-11-26 DIAGNOSIS — Z9189 Other specified personal risk factors, not elsewhere classified: Secondary | ICD-10-CM | POA: Diagnosis not present

## 2019-11-26 DIAGNOSIS — Z6831 Body mass index (BMI) 31.0-31.9, adult: Secondary | ICD-10-CM

## 2019-11-26 DIAGNOSIS — F3289 Other specified depressive episodes: Secondary | ICD-10-CM

## 2019-11-26 DIAGNOSIS — E669 Obesity, unspecified: Secondary | ICD-10-CM

## 2019-11-26 DIAGNOSIS — E559 Vitamin D deficiency, unspecified: Secondary | ICD-10-CM | POA: Diagnosis not present

## 2019-11-26 DIAGNOSIS — R7303 Prediabetes: Secondary | ICD-10-CM | POA: Diagnosis not present

## 2019-11-26 MED ORDER — BUPROPION HCL ER (SR) 200 MG PO TB12: 200.0000 mg | ORAL_TABLET | Freq: Every day | ORAL | 0 refills | Status: DC

## 2019-11-26 MED ORDER — VITAMIN D (ERGOCALCIFEROL) 1.25 MG (50000 UNIT) PO CAPS: 50000.0000 [IU] | ORAL_CAPSULE | ORAL | 0 refills | Status: DC

## 2019-11-26 MED ORDER — METFORMIN HCL 500 MG PO TABS: 500.0000 mg | ORAL_TABLET | Freq: Two times a day (BID) | ORAL | 0 refills | Status: DC

## 2019-11-30 NOTE — Progress Notes (Signed)
Chief Complaint:   OBESITY Cameron Valenzuela is here to discuss his progress with his obesity treatment plan along with follow-up of his obesity related diagnoses. Cameron Valenzuela is on the Category 4 Plan and states he is following his eating plan approximately 70% of the time. Karver states he is going to the gym 2-3 times per week.  Today's visit was #: 25 Starting weight: 256 lbs Starting date: 09/18/2018 Today's weight: 216 lbs Today's date: 11/26/2019 Total lbs lost to date: 40 lbs Total lbs lost since last in-office visit: 3 lbs  Interim History: Cameron Valenzuela has returned to the clinic today after 1 month.  She has increased his exercise and decreased indulgences.  He has noticed he has been eating out more but has tried to cut back.  He has an egg for breakfast (habitually).  He reports some upcoming stress with his daughter graduating high school and going to college.  Subjective:   1. Prediabetes [name] has a diagnosis of prediabetes based on his elevated HgA1c and was informed this puts him at greater risk of developing diabetes. He continues to work on diet and exercise to decrease his risk of diabetes. He denies nausea or hypoglycemia.  He is taking metformin with no GI side effects.  Lab Results  Component Value Date   HGBA1C 5.1 07/27/2019   Lab Results  Component Value Date   INSULIN 22.7 07/27/2019   INSULIN 16.4 02/11/2019   INSULIN 60.2 (H) 09/18/2018   2. Vitamin D deficiency [name]'s Vitamin D level was 20.8 on 07/27/2019. He is currently taking prescription vitamin D 50,000 IU each week. He denies nausea, vomiting or muscle weakness.  3. Other depression [name] is struggling with emotional eating and using food for comfort to the extent that it is negatively impacting his health. He has been working on behavior modification techniques to help reduce his emotional eating and has been successful. He shows no sign of suicidal or homicidal ideations.  [name] says his symptoms  were been better controlled on Wellbutrin.  He went off Wellbutrin, but noticed an increase in irritability.  4. At risk for osteoporosis [name] is at higher risk of osteopenia and osteoporosis due to Vitamin D deficiency.   Assessment/Plan:   1. Prediabetes Derby will continue to work on weight loss, exercise, and decreasing simple carbohydrates to help decrease the risk of diabetes.  - metFORMIN (GLUCOPHAGE) 500 MG tablet; Take 1 tablet (500 mg total) by mouth 2 (two) times daily with a meal.  Dispense: 60 tablet; Refill: 0  2. Vitamin D deficiency Low Vitamin D level contributes to fatigue and are associated with obesity, breast, and colon cancer. He agrees to continue to take prescription Vitamin D @50 ,000 IU every week and will follow-up for routine testing of Vitamin D, at least 2-3 times per year to avoid over-replacement. - Vitamin D, Ergocalciferol, (DRISDOL) 1.25 MG (50000 UNIT) CAPS capsule; Take 1 capsule (50,000 Units total) by mouth every 7 (seven) days.  Dispense: 4 capsule; Refill: 0  3. Other depression Behavior modification techniques were discussed today to help Cameron Valenzuela deal with his emotional/non-hunger eating behaviors.  Orders and follow up as documented in patient record.  - buPROPion (WELLBUTRIN SR) 200 MG 12 hr tablet; Take 1 tablet (200 mg total) by mouth daily.  Dispense: 30 tablet; Refill: 0  4. At risk for osteoporosis Cameron Valenzuela was given approximately 15 minutes of osteoporosis prevention counseling today. [name] is at risk for osteopenia and osteoporosis due to his Vitamin D deficiency.  He was encouraged to take his Vitamin D and follow his higher calcium diet and increase strengthening exercise to help strengthen his bones and decrease his risk of osteopenia and osteoporosis.  Repetitive spaced learning was employed today to elicit superior memory formation and behavioral change.  5. Class 1 obesity with serious comorbidity and body mass index (BMI) of 31.0  to 31.9 in adult, unspecified obesity type Cameron Valenzuela is currently in the action stage of change. As such, his goal is to continue with weight loss efforts. He has agreed to the Category 4 Plan.   Exercise goals: [name] will add more resistance training.  Behavioral modification strategies: increasing lean protein intake, meal planning and cooking strategies, keeping healthy foods in the home and planning for success.  [name] has agreed to follow-up with our clinic in 4 weeks. He was informed of the importance of frequent follow-up visits to maximize his success with intensive lifestyle modifications for his multiple health conditions.   Objective:   Blood pressure 121/75, pulse 82, temperature (!) 97.5 F (36.4 C), temperature source Oral, height 5\' 10"  (1.778 m), weight 216 lb (98 kg), SpO2 98 %. Body mass index is 30.99 kg/m.  General: Cooperative, alert, well developed, in no acute distress. HEENT: Conjunctivae and lids unremarkable. Cardiovascular: Regular rhythm.  Lungs: Normal work of breathing. Neurologic: No focal deficits.   Lab Results  Component Value Date   CREATININE 0.96 07/27/2019   BUN 17 07/27/2019   NA 140 07/27/2019   K 4.6 07/27/2019   CL 101 07/27/2019   CO2 26 07/27/2019   Lab Results  Component Value Date   ALT 22 07/27/2019   AST 18 07/27/2019   ALKPHOS 76 07/27/2019   BILITOT 0.4 07/27/2019   Lab Results  Component Value Date   HGBA1C 5.1 07/27/2019   HGBA1C 5.0 02/11/2019   HGBA1C 6.2 (H) 09/18/2018   HGBA1C 5.5 06/24/2018   HGBA1C 5.6 06/28/2017   Lab Results  Component Value Date   INSULIN 22.7 07/27/2019   INSULIN 16.4 02/11/2019   INSULIN 60.2 (H) 09/18/2018   Lab Results  Component Value Date   TSH 3.960 09/18/2018   Lab Results  Component Value Date   CHOL 232 (H) 07/27/2019   HDL 32 (L) 07/27/2019   LDLCALC 157 (H) 07/27/2019   LDLDIRECT 116.0 06/24/2018   TRIG 234 (H) 07/27/2019   CHOLHDL 8 06/24/2018   Lab Results    Component Value Date   WBC 5.4 09/18/2018   HGB 15.1 09/18/2018   HCT 41.5 09/18/2018   MCV 86 09/18/2018   PLT 176.0 06/24/2018   Attestation Statements:   Reviewed by clinician on day of visit: allergies, medications, problem list, medical history, surgical history, family history, social history, and previous encounter notes.  I, Water quality scientist, CMA, am acting as transcriptionist for Coralie Common, MD.  I have reviewed the above documentation for accuracy and completeness, and I agree with the above. - Ilene Qua, MD

## 2019-12-22 ENCOUNTER — Other Ambulatory Visit (INDEPENDENT_AMBULATORY_CARE_PROVIDER_SITE_OTHER): Payer: Self-pay | Admitting: Family Medicine

## 2019-12-22 DIAGNOSIS — R7303 Prediabetes: Secondary | ICD-10-CM

## 2019-12-22 DIAGNOSIS — F3289 Other specified depressive episodes: Secondary | ICD-10-CM

## 2019-12-24 ENCOUNTER — Encounter (INDEPENDENT_AMBULATORY_CARE_PROVIDER_SITE_OTHER): Payer: Self-pay | Admitting: Family Medicine

## 2019-12-24 ENCOUNTER — Ambulatory Visit (INDEPENDENT_AMBULATORY_CARE_PROVIDER_SITE_OTHER): Payer: BC Managed Care – PPO | Admitting: Family Medicine

## 2019-12-24 ENCOUNTER — Other Ambulatory Visit: Payer: Self-pay

## 2019-12-24 VITALS — BP 119/77 | HR 77 | Temp 98.0°F | Ht 70.0 in | Wt 214.0 lb

## 2019-12-24 DIAGNOSIS — Z9189 Other specified personal risk factors, not elsewhere classified: Secondary | ICD-10-CM | POA: Diagnosis not present

## 2019-12-24 DIAGNOSIS — R7303 Prediabetes: Secondary | ICD-10-CM | POA: Diagnosis not present

## 2019-12-24 DIAGNOSIS — E7849 Other hyperlipidemia: Secondary | ICD-10-CM | POA: Diagnosis not present

## 2019-12-24 DIAGNOSIS — E559 Vitamin D deficiency, unspecified: Secondary | ICD-10-CM | POA: Diagnosis not present

## 2019-12-24 DIAGNOSIS — Z6830 Body mass index (BMI) 30.0-30.9, adult: Secondary | ICD-10-CM

## 2019-12-24 DIAGNOSIS — F3289 Other specified depressive episodes: Secondary | ICD-10-CM | POA: Diagnosis not present

## 2019-12-24 DIAGNOSIS — E669 Obesity, unspecified: Secondary | ICD-10-CM

## 2019-12-24 NOTE — Progress Notes (Signed)
Chief Complaint:   Cameron Valenzuela is here to discuss his progress with his obesity treatment plan along with follow-up of his obesity related diagnoses. Cameron Valenzuela is on the Category 4 Plan and states he is following his eating plan approximately 60% of the time. Cameron Valenzuela states he is walking 2 miles 4 times per week.  Today's visit was #: 33 Starting weight: 256 lbs Starting date: 09/18/2018 Today's weight: 214 lbs Today's date: 12/24/2019 Total lbs lost to date: 42 Total lbs lost since last in-office visit: 2  Interim History: Cameron Valenzuela's daughter graduated from high school and that involved a significant amount of eating out and he tried to make smart choices when eating out. He has started walking. He will be taking his daughter to college in ~2 weeks. He will be working and has no other plans.  Subjective:   Vitamin D deficiency. No nausea, vomiting, or muscle weakness, but he endorses fatigue. Cameron Valenzuela is on prescription Vitamin D. Last Vitamin D was 20.8 on 07/27/2019.  Prediabetes. Cameron Valenzuela has a diagnosis of prediabetes based on his elevated HgA1c and was informed this puts him at greater risk of developing diabetes. He continues to work on diet and exercise to decrease his risk of diabetes. He denies nausea or hypoglycemia. Cameron Valenzuela is on metformin.  Lab Results  Component Value Date   HGBA1C 5.1 07/27/2019   Lab Results  Component Value Date   INSULIN 22.7 07/27/2019   INSULIN 16.4 02/11/2019   INSULIN 60.2 (H) 09/18/2018   Other depression, with emotional eating. Cameron Valenzuela is struggling with emotional eating and using food for comfort to the extent that it is negatively impacting his health. He has been working on behavior modification techniques to help reduce his emotional eating and has been somewhat successful. He shows no sign of suicidal or homicidal ideations. Cameron Valenzuela reports his symptoms are well controlled, but does note symptoms returning if he is off  Wellbutrin for a few days.  Other hyperlipidemia. Cameron Valenzuela is not on a statin.   Lab Results  Component Value Date   CHOL 232 (H) 07/27/2019   HDL 32 (L) 07/27/2019   LDLCALC 157 (H) 07/27/2019   LDLDIRECT 116.0 06/24/2018   TRIG 234 (H) 07/27/2019   CHOLHDL 8 06/24/2018   Lab Results  Component Value Date   ALT 22 07/27/2019   AST 18 07/27/2019   ALKPHOS 76 07/27/2019   BILITOT 0.4 07/27/2019   The 10-year ASCVD risk score Cameron Bussing DC Jr., et al., 2013) is: 6%   Values used to calculate the score:     Age: 49 years     Sex: Male     Is Non-Hispanic African American: No     Diabetic: No     Tobacco smoker: No     Systolic Blood Pressure: 213 mmHg     Is BP treated: No     HDL Cholesterol: 32 mg/dL     Total Cholesterol: 232 mg/dL  At risk for heart disease. Cameron Valenzuela is at a higher than average risk for cardiovascular disease due to obesity.   Assessment/Plan:   Vitamin D deficiency. Low Vitamin D level contributes to fatigue and are associated with obesity, breast, and colon cancer. He was given a refill on his Vitamin D, Ergocalciferol, (DRISDOL) 1.25 MG (50000 UNIT) CAPS capsule every week #4 with 0 refills and VITAMIN D 25 Hydroxy (Vit-D Deficiency, Fractures) level was ordered today.  Prediabetes. Adric will continue to work on weight loss, exercise, and  decreasing simple carbohydrates to help decrease the risk of diabetes. Refill was given for metFORMIN (GLUCOPHAGE) 500 MG tablet PO BID #60 with 0 refills. Comprehensive metabolic panel, Hemoglobin A1c, Insulin, random labs were ordered today.  Other depression, with emotional eating. Behavior modification techniques were discussed today to help Cameron Valenzuela deal with his emotional/non-hunger eating behaviors.  Orders and follow up as documented in patient record. Refill was given for buPROPion (WELLBUTRIN SR) 200 MG 12 hr tablet PO daily #30 with 0 refills.  Other hyperlipidemia. Cardiovascular risk and specific lipid/LDL goals  reviewed.  We discussed several lifestyle modifications today and Cameron Valenzuela will continue to work on diet, exercise and weight loss efforts. Orders and follow up as documented in patient record. Comprehensive metabolic panel, Lipid Panel With LDL/HDL Ratio labs ordered today.  Counseling Intensive lifestyle modifications are the first line treatment for this issue. . Dietary changes: Increase soluble fiber. Decrease simple carbohydrates. . Exercise changes: Moderate to vigorous-intensity aerobic activity 150 minutes per week if tolerated. . Lipid-lowering medications: see documented in medical record.   At risk for heart disease. Cameron Valenzuela was given approximately 15 minutes of coronary artery disease prevention counseling today. He is 49 y.o. male and has risk factors for heart disease including obesity. We discussed intensive lifestyle modifications today with an emphasis on specific weight loss instructions and strategies.   Repetitive spaced learning was employed today to elicit superior memory formation and behavioral change.  Class 1 obesity with serious comorbidity and body mass index (BMI) of 30.0 to 30.9 in adult, unspecified obesity type.  Cameron Valenzuela is currently in the action stage of change. As such, his goal is to continue with weight loss efforts. He has agreed to the Category 4 Plan.   Exercise goals: Cameron Valenzuela will continue his current exercise regimen and will add resistance training up to 30 minutes 2 times per week.  Behavioral modification strategies: increasing lean protein intake, meal planning and cooking strategies, keeping healthy foods in the home and planning for success.  Cameron Valenzuela has agreed to follow-up with our clinic in 3 weeks. He was informed of the importance of frequent follow-up visits to maximize his success with intensive lifestyle modifications for his multiple health conditions.   Cameron Valenzuela was informed we would discuss his lab results at his next visit unless there  is a critical issue that needs to be addressed sooner. Cameron Valenzuela agreed to keep his next visit at the agreed upon time to discuss these results.  Objective:   Blood pressure 119/77, pulse 77, temperature 98 F (36.7 C), temperature source Oral, height 5\' 10"  (1.778 m), weight 214 lb (97.1 kg). Body mass index is 30.71 kg/m.  General: Cooperative, alert, well developed, in no acute distress. HEENT: Conjunctivae and lids unremarkable. Cardiovascular: Regular rhythm.  Lungs: Normal work of breathing. Neurologic: No focal deficits.   Lab Results  Component Value Date   CREATININE 0.96 07/27/2019   BUN 17 07/27/2019   NA 140 07/27/2019   K 4.6 07/27/2019   CL 101 07/27/2019   CO2 26 07/27/2019   Lab Results  Component Value Date   ALT 22 07/27/2019   AST 18 07/27/2019   ALKPHOS 76 07/27/2019   BILITOT 0.4 07/27/2019   Lab Results  Component Value Date   HGBA1C 5.1 07/27/2019   HGBA1C 5.0 02/11/2019   HGBA1C 6.2 (H) 09/18/2018   HGBA1C 5.5 06/24/2018   HGBA1C 5.6 06/28/2017   Lab Results  Component Value Date   INSULIN 22.7 07/27/2019   INSULIN 16.4  02/11/2019   INSULIN 60.2 (H) 09/18/2018   Lab Results  Component Value Date   TSH 3.960 09/18/2018   Lab Results  Component Value Date   CHOL 232 (H) 07/27/2019   HDL 32 (L) 07/27/2019   LDLCALC 157 (H) 07/27/2019   LDLDIRECT 116.0 06/24/2018   TRIG 234 (H) 07/27/2019   CHOLHDL 8 06/24/2018   Lab Results  Component Value Date   WBC 5.4 09/18/2018   HGB 15.1 09/18/2018   HCT 41.5 09/18/2018   MCV 86 09/18/2018   PLT 176.0 06/24/2018   No results found for: IRON, TIBC, FERRITIN  Attestation Statements:   Reviewed by clinician on day of visit: allergies, medications, problem list, medical history, surgical history, family history, social history, and previous encounter notes.  I, Michaelene Song, am acting as transcriptionist for Coralie Common, MD   I have reviewed the above documentation for accuracy and  completeness, and I agree with the above. - Jinny Blossom, MD

## 2019-12-25 LAB — COMPREHENSIVE METABOLIC PANEL
ALT: 31 IU/L (ref 0–44)
AST: 28 IU/L (ref 0–40)
Albumin/Globulin Ratio: 2 (ref 1.2–2.2)
Albumin: 4.7 g/dL (ref 4.0–5.0)
Alkaline Phosphatase: 69 IU/L (ref 48–121)
BUN/Creatinine Ratio: 19 (ref 9–20)
BUN: 21 mg/dL (ref 6–24)
Bilirubin Total: 0.4 mg/dL (ref 0.0–1.2)
CO2: 24 mmol/L (ref 20–29)
Calcium: 9.5 mg/dL (ref 8.7–10.2)
Chloride: 102 mmol/L (ref 96–106)
Creatinine, Ser: 1.12 mg/dL (ref 0.76–1.27)
GFR calc Af Amer: 88 mL/min/{1.73_m2} (ref >59)
GFR calc non Af Amer: 76 mL/min/{1.73_m2} (ref >59)
Globulin, Total: 2.4 g/dL (ref 1.5–4.5)
Glucose: 97 mg/dL (ref 65–99)
Potassium: 4.6 mmol/L (ref 3.5–5.2)
Sodium: 141 mmol/L (ref 134–144)
Total Protein: 7.1 g/dL (ref 6.0–8.5)

## 2019-12-25 LAB — VITAMIN D 25 HYDROXY (VIT D DEFICIENCY, FRACTURES): Vit D, 25-Hydroxy: 44.3 ng/mL (ref 30.0–100.0)

## 2019-12-25 LAB — LIPID PANEL WITH LDL/HDL RATIO
Cholesterol, Total: 224 mg/dL — ABNORMAL HIGH (ref 100–199)
HDL: 32 mg/dL — ABNORMAL LOW (ref >39)
LDL Chol Calc (NIH): 146 mg/dL — ABNORMAL HIGH (ref 0–99)
LDL/HDL Ratio: 4.6 ratio — ABNORMAL HIGH (ref 0.0–3.6)
Triglycerides: 253 mg/dL — ABNORMAL HIGH (ref 0–149)
VLDL Cholesterol Cal: 46 mg/dL — ABNORMAL HIGH (ref 5–40)

## 2019-12-25 LAB — HEMOGLOBIN A1C
Est. average glucose Bld gHb Est-mCnc: 103 mg/dL
Hgb A1c MFr Bld: 5.2 % (ref 4.8–5.6)

## 2019-12-25 LAB — INSULIN, RANDOM: INSULIN: 18.7 u[IU]/mL (ref 2.6–24.9)

## 2019-12-25 MED ORDER — VITAMIN D (ERGOCALCIFEROL) 1.25 MG (50000 UNIT) PO CAPS: 50000.0000 [IU] | ORAL_CAPSULE | ORAL | 0 refills | Status: DC

## 2019-12-25 MED ORDER — BUPROPION HCL ER (SR) 200 MG PO TB12: 200.0000 mg | ORAL_TABLET | Freq: Every day | ORAL | 0 refills | Status: DC

## 2019-12-25 MED ORDER — METFORMIN HCL 500 MG PO TABS: 500.0000 mg | ORAL_TABLET | Freq: Two times a day (BID) | ORAL | 0 refills | Status: DC

## 2020-01-20 ENCOUNTER — Encounter: Payer: Self-pay | Admitting: Internal Medicine

## 2020-01-20 ENCOUNTER — Other Ambulatory Visit (INDEPENDENT_AMBULATORY_CARE_PROVIDER_SITE_OTHER): Payer: Self-pay | Admitting: Family Medicine

## 2020-01-20 DIAGNOSIS — R7303 Prediabetes: Secondary | ICD-10-CM

## 2020-01-20 DIAGNOSIS — F3289 Other specified depressive episodes: Secondary | ICD-10-CM

## 2020-01-21 ENCOUNTER — Other Ambulatory Visit: Payer: Self-pay

## 2020-01-21 ENCOUNTER — Encounter (INDEPENDENT_AMBULATORY_CARE_PROVIDER_SITE_OTHER): Payer: Self-pay | Admitting: Family Medicine

## 2020-01-21 ENCOUNTER — Ambulatory Visit (INDEPENDENT_AMBULATORY_CARE_PROVIDER_SITE_OTHER): Payer: BC Managed Care – PPO | Admitting: Family Medicine

## 2020-01-21 VITALS — BP 104/69 | HR 69 | Temp 97.8°F | Ht 70.0 in | Wt 220.0 lb

## 2020-01-21 DIAGNOSIS — Z6830 Body mass index (BMI) 30.0-30.9, adult: Secondary | ICD-10-CM

## 2020-01-21 DIAGNOSIS — R7303 Prediabetes: Secondary | ICD-10-CM

## 2020-01-21 DIAGNOSIS — E559 Vitamin D deficiency, unspecified: Secondary | ICD-10-CM | POA: Diagnosis not present

## 2020-01-21 DIAGNOSIS — E7849 Other hyperlipidemia: Secondary | ICD-10-CM | POA: Diagnosis not present

## 2020-01-21 DIAGNOSIS — E669 Obesity, unspecified: Secondary | ICD-10-CM

## 2020-01-21 DIAGNOSIS — Z9189 Other specified personal risk factors, not elsewhere classified: Secondary | ICD-10-CM

## 2020-01-21 MED ORDER — VITAMIN D (ERGOCALCIFEROL) 1.25 MG (50000 UNIT) PO CAPS: 50000.0000 [IU] | ORAL_CAPSULE | ORAL | 0 refills | Status: DC

## 2020-01-21 MED ORDER — METFORMIN HCL 500 MG PO TABS: 500.0000 mg | ORAL_TABLET | Freq: Two times a day (BID) | ORAL | 0 refills | Status: DC

## 2020-01-26 NOTE — Progress Notes (Signed)
Chief Complaint:   OBESITY Cameron Valenzuela is here to discuss his progress with his obesity treatment plan along with follow-up of his obesity related diagnoses. Cameron Valenzuela is on the Category 4 Plan and states he is following his eating plan approximately 70% of the time. Cameron Valenzuela states he is walking for 30-45 minutes 3 times per week.  Today's visit was #: 14 Starting weight: 256 lbs Starting date: 09/18/2018 Today's weight: 220 lbs Today's date: 01/21/2020 Total lbs lost to date: 36 Total lbs lost since last in-office visit: 0  Interim History: Cameron Valenzuela's daughter is at school and so he is eating more at home. He is exercising more and drinking more water. Dinner has been slightly off the plan, burgers 1 night, and spaghetti 1 night. He thinks he is having too many carbohydrates.  Subjective:   1. Vitamin D deficiency Cameron Valenzuela denies nausea, vomiting, or muscle weakness, but he notes fatigue. Last Vit D level was 44.3.  2. Pre-diabetes Cameron Valenzuela's last A1c was 5.2 and insulin 18.7. He is on metformin and denies GI side effects.  3. Other hyperlipidemia Cameron Valenzuela 10 year ASCVD risk score is 4.5%. He was encouraged lifestyle modifications per guidelines.  4. At risk for osteoporosis Cameron Valenzuela is at higher risk of osteopenia and osteoporosis due to Vitamin D deficiency.   Assessment/Plan:   1. Vitamin D deficiency Low Vitamin D level contributes to fatigue and are associated with obesity, breast, and colon cancer. We will refill prescription Vitamin D for 1 month. Cameron Valenzuela will follow-up for routine testing of Vitamin D, at least 2-3 times per year to avoid over-replacement.  - Vitamin D, Ergocalciferol, (DRISDOL) 1.25 MG (50000 UNIT) CAPS capsule; Take 1 capsule (50,000 Units total) by mouth every 7 (seven) days.  Dispense: 4 capsule; Refill: 0  2. Pre-diabetes Cameron Valenzuela will continue to work on weight loss, exercise, and decreasing simple carbohydrates to help decrease the risk of diabetes. We  will refill metformin for 1 month.  - metFORMIN (GLUCOPHAGE) 500 MG tablet; Take 1 tablet (500 mg total) by mouth 2 (two) times daily with a meal.  Dispense: 60 tablet; Refill: 0  3. Other hyperlipidemia Cardiovascular risk and specific lipid/LDL goals reviewed. We discussed several lifestyle modifications today and Cameron Valenzuela will continue to work on diet, exercise and weight loss efforts. We will repeat labs in 3 months. Orders and follow up as documented in patient record.   Counseling Intensive lifestyle modifications are the first line treatment for this issue. . Dietary changes: Increase soluble fiber. Decrease simple carbohydrates. . Exercise changes: Moderate to vigorous-intensity aerobic activity 150 minutes per week if tolerated. . Lipid-lowering medications: see documented in medical record.   4. At risk for osteoporosis Cameron Valenzuela was given approximately 15 minutes of osteoporosis prevention counseling today. Cameron Valenzuela is at risk for osteopenia and osteoporosis due to his Vitamin D deficiency. He was encouraged to take his Vitamin D and follow his higher calcium diet and increase strengthening exercise to help strengthen his bones and decrease his risk of osteopenia and osteoporosis.  Repetitive spaced learning was employed today to elicit superior memory formation and behavioral change.  5. Class 1 obesity with serious comorbidity and body mass index (BMI) of 30.0 to 30.9 in adult, unspecified obesity type Cameron Valenzuela is currently in the action stage of change. As such, his goal is to continue with weight loss efforts. He has agreed to the Category 4 Plan.   Exercise goals: As is, she is to add 10-15 minutes of resistance training 2  times per week.  Behavioral modification strategies: increasing lean protein intake, meal planning and cooking strategies, keeping healthy foods in the home and planning for success.  Cameron Valenzuela has agreed to follow-up with our clinic in 2 weeks. He was informed  of the importance of frequent follow-up visits to maximize his success with intensive lifestyle modifications for his multiple health conditions.   Objective:   Blood pressure 104/69, pulse 69, temperature 97.8 F (36.6 C), temperature source Oral, height 5\' 10"  (1.778 m), weight 220 lb (99.8 kg), SpO2 97 %. Body mass index is 31.57 kg/m.  General: Cooperative, alert, well developed, in no acute distress. HEENT: Conjunctivae and lids unremarkable. Cardiovascular: Regular rhythm.  Lungs: Normal work of breathing. Neurologic: No focal deficits.   Lab Results  Component Value Date   CREATININE 1.12 12/24/2019   BUN 21 12/24/2019   NA 141 12/24/2019   K 4.6 12/24/2019   CL 102 12/24/2019   CO2 24 12/24/2019   Lab Results  Component Value Date   ALT 31 12/24/2019   AST 28 12/24/2019   ALKPHOS 69 12/24/2019   BILITOT 0.4 12/24/2019   Lab Results  Component Value Date   HGBA1C 5.2 12/24/2019   HGBA1C 5.1 07/27/2019   HGBA1C 5.0 02/11/2019   HGBA1C 6.2 (H) 09/18/2018   HGBA1C 5.5 06/24/2018   Lab Results  Component Value Date   INSULIN 18.7 12/24/2019   INSULIN 22.7 07/27/2019   INSULIN 16.4 02/11/2019   INSULIN 60.2 (H) 09/18/2018   Lab Results  Component Value Date   TSH 3.960 09/18/2018   Lab Results  Component Value Date   CHOL 224 (H) 12/24/2019   HDL 32 (L) 12/24/2019   LDLCALC 146 (H) 12/24/2019   LDLDIRECT 116.0 06/24/2018   TRIG 253 (H) 12/24/2019   CHOLHDL 8 06/24/2018   Lab Results  Component Value Date   WBC 5.4 09/18/2018   HGB 15.1 09/18/2018   HCT 41.5 09/18/2018   MCV 86 09/18/2018   PLT 176.0 06/24/2018   No results found for: IRON, TIBC, FERRITIN  Attestation Statements:   Reviewed by clinician on day of visit: allergies, medications, problem list, medical history, surgical history, family history, social history, and previous encounter notes.   I, Trixie Dredge, am acting as transcriptionist for Coralie Common, MD.  I have  reviewed the above documentation for accuracy and completeness, and I agree with the above. - Jinny Blossom, MD

## 2020-02-18 ENCOUNTER — Other Ambulatory Visit: Payer: Self-pay

## 2020-02-18 ENCOUNTER — Ambulatory Visit (INDEPENDENT_AMBULATORY_CARE_PROVIDER_SITE_OTHER): Payer: BC Managed Care – PPO | Admitting: Family Medicine

## 2020-02-18 ENCOUNTER — Encounter (INDEPENDENT_AMBULATORY_CARE_PROVIDER_SITE_OTHER): Payer: Self-pay | Admitting: Family Medicine

## 2020-02-18 VITALS — BP 121/75 | HR 85 | Temp 97.8°F | Ht 70.0 in | Wt 219.0 lb

## 2020-02-18 DIAGNOSIS — Z9189 Other specified personal risk factors, not elsewhere classified: Secondary | ICD-10-CM | POA: Diagnosis not present

## 2020-02-18 DIAGNOSIS — E669 Obesity, unspecified: Secondary | ICD-10-CM

## 2020-02-18 DIAGNOSIS — E559 Vitamin D deficiency, unspecified: Secondary | ICD-10-CM

## 2020-02-18 DIAGNOSIS — R7303 Prediabetes: Secondary | ICD-10-CM

## 2020-02-18 DIAGNOSIS — Z6831 Body mass index (BMI) 31.0-31.9, adult: Secondary | ICD-10-CM

## 2020-02-18 MED ORDER — METFORMIN HCL 500 MG PO TABS: 500.0000 mg | ORAL_TABLET | Freq: Two times a day (BID) | ORAL | 0 refills | Status: DC

## 2020-02-18 NOTE — Progress Notes (Signed)
Chief Complaint:   OBESITY Cameron Valenzuela is here to discuss his progress with his obesity treatment plan along with follow-up of his obesity related diagnoses. Cameron Valenzuela is on the Category 4 Plan and states he is following his eating plan approximately 50% of the time. Cameron Valenzuela states he is walking 15,000 steps 3-5 times per week.  Today's visit was #: 28 Starting weight: 256 lbs Starting date: 09/18/2018 Today's weight: 219 lbs Today's date: 02/18/2020 Total lbs lost to date: 37 Total lbs lost since last in-office visit: 1  Interim History: Cameron Valenzuela voices that he has been eating more carbohydrates than he knows is on the meal plan. His daughter is going back to school August 23 and he realizes this will likely lead to eating off the plan. He voices that he also has some other stress that may lead to stress eating. He is planning to go to the grocery store today or tomorrow.  Subjective:   1. Pre-diabetes Cameron Valenzuela's last A1c was 5.2 and insulin 18.7. He is on metformin with GI side effects of metformin when he increases carbohydrates.  2. Vitamin D deficiency Cameron Valenzuela denies nausea, vomiting, or muscle weakness, but he notes fatigue. He is on prescription Vit D.  3. At risk for diabetes mellitus Cameron Valenzuela is at higher than average risk for developing diabetes due to his obesity.   Assessment/Plan:   1. Pre-diabetes Cameron Valenzuela will continue to work on weight loss, exercise, and decreasing simple carbohydrates to help decrease the risk of diabetes. We will refill metformin for 1 month.  - metFORMIN (GLUCOPHAGE) 500 MG tablet; Take 1 tablet (500 mg total) by mouth 2 (two) times daily with a meal.  Dispense: 60 tablet; Refill: 0  2. Vitamin D deficiency Low Vitamin D level contributes to fatigue and are associated with obesity, breast, and colon cancer. Cameron Valenzuela agreed to continue taking prescription Vitamin D 50,000 IU every week, no refill needed. He will follow-up for routine testing of Vitamin  D, at least 2-3 times per year to avoid over-replacement.  3. At risk for diabetes mellitus Cameron Valenzuela was given approximately 15 minutes of diabetes education and counseling today. We discussed intensive lifestyle modifications today with an emphasis on weight loss as well as increasing exercise and decreasing simple carbohydrates in his diet. We also reviewed medication options with an emphasis on risk versus benefit of those discussed.   Repetitive spaced learning was employed today to elicit superior memory formation and behavioral change.  4. Class 1 obesity with serious comorbidity and body mass index (BMI) of 31.0 to 31.9 in adult, unspecified obesity type Cameron Valenzuela is currently in the action stage of change. As such, his goal is to continue with weight loss efforts. He has agreed to the Category 4 Plan.   Exercise goals: As is, and add resistance training 2 times per week.  Behavioral modification strategies: increasing lean protein intake, increasing vegetables, meal planning and cooking strategies, keeping healthy foods in the home, emotional eating strategies and planning for success.  Cameron Valenzuela has agreed to follow-up with our clinic in 4 weeks. He was informed of the importance of frequent follow-up visits to maximize his success with intensive lifestyle modifications for his multiple health conditions.   Objective:   Blood pressure 121/75, pulse 85, temperature 97.8 F (36.6 C), temperature source Oral, height 5\' 10"  (1.778 m), weight 219 lb (99.3 kg), SpO2 98 %. Body mass index is 31.42 kg/m.  General: Cooperative, alert, well developed, in no acute distress. HEENT: Conjunctivae and  lids unremarkable. Cardiovascular: Regular rhythm.  Lungs: Normal work of breathing. Neurologic: No focal deficits.   Lab Results  Component Value Date   CREATININE 1.12 12/24/2019   BUN 21 12/24/2019   NA 141 12/24/2019   K 4.6 12/24/2019   CL 102 12/24/2019   CO2 24 12/24/2019   Lab Results   Component Value Date   ALT 31 12/24/2019   AST 28 12/24/2019   ALKPHOS 69 12/24/2019   BILITOT 0.4 12/24/2019   Lab Results  Component Value Date   HGBA1C 5.2 12/24/2019   HGBA1C 5.1 07/27/2019   HGBA1C 5.0 02/11/2019   HGBA1C 6.2 (H) 09/18/2018   HGBA1C 5.5 06/24/2018   Lab Results  Component Value Date   INSULIN 18.7 12/24/2019   INSULIN 22.7 07/27/2019   INSULIN 16.4 02/11/2019   INSULIN 60.2 (H) 09/18/2018   Lab Results  Component Value Date   TSH 3.960 09/18/2018   Lab Results  Component Value Date   CHOL 224 (H) 12/24/2019   HDL 32 (L) 12/24/2019   LDLCALC 146 (H) 12/24/2019   LDLDIRECT 116.0 06/24/2018   TRIG 253 (H) 12/24/2019   CHOLHDL 8 06/24/2018   Lab Results  Component Value Date   WBC 5.4 09/18/2018   HGB 15.1 09/18/2018   HCT 41.5 09/18/2018   MCV 86 09/18/2018   PLT 176.0 06/24/2018   No results found for: IRON, TIBC, FERRITIN  Attestation Statements:   Reviewed by clinician on day of visit: allergies, medications, problem list, medical history, surgical history, family history, social history, and previous encounter notes.   I, Trixie Dredge, am acting as transcriptionist for Coralie Common, MD.  I have reviewed the above documentation for accuracy and completeness, and I agree with the above. - Jinny Blossom, MD

## 2020-02-21 ENCOUNTER — Other Ambulatory Visit (INDEPENDENT_AMBULATORY_CARE_PROVIDER_SITE_OTHER): Payer: Self-pay | Admitting: Family Medicine

## 2020-02-21 DIAGNOSIS — F3289 Other specified depressive episodes: Secondary | ICD-10-CM

## 2020-03-14 ENCOUNTER — Telehealth: Payer: Self-pay

## 2020-03-14 ENCOUNTER — Other Ambulatory Visit: Payer: Self-pay

## 2020-03-14 ENCOUNTER — Telehealth (INDEPENDENT_AMBULATORY_CARE_PROVIDER_SITE_OTHER): Payer: BC Managed Care – PPO | Admitting: Medical

## 2020-03-14 VITALS — Temp 97.5°F

## 2020-03-14 DIAGNOSIS — T7840XA Allergy, unspecified, initial encounter: Secondary | ICD-10-CM

## 2020-03-14 MED ORDER — PREDNISONE 10 MG (21) PO TBPK
ORAL_TABLET | ORAL | 0 refills | Status: DC
Start: 2020-03-14 — End: 2020-03-24

## 2020-03-14 MED ORDER — HYDROXYZINE HCL 25 MG PO TABS: 25.0000 mg | ORAL_TABLET | Freq: Three times a day (TID) | ORAL | 0 refills | Status: DC | PRN

## 2020-03-14 NOTE — Progress Notes (Signed)
   Subjective:    Patient ID: Cameron Valenzuela, male    DOB: Sep 19, 1970, 49 y.o.   MRN: [id number]  HPI  Virtual Visit via Video Note  I connected with Bruk Tumolo Paulo on 03/14/20 at  3:20 PM EDT by a video enabled telemedicine application and verified that I am speaking with the correct person using two identifiers.  Location: Patient: home Provider: office Center Participants - pt and myself.    I discussed the limitations of evaluation and management by telemedicine and the availability of in person appointments. The patient expressed understanding and agreed to proceed.  History of Present Illness: Pt in for recent rash. He was working in backyard on saturday. Trimming bushes and got into poison ivy. He got his all over. Worse area on arms. Hx of severe allergies to poison ivy. No sob or wheezing.  Areas on both arms, legs, abdomen and some on left side neck.   Observations/Objective:   General-no acute distress, pleasant, oriented. Lungs- on inspection lungs appear unlabored. Neck- no tracheal deviation or jvd on inspection. Neuro- gross motor function appears intact. Rash- mild rash on forearm. Not well seen on video. Some on left side neck as well.   Assessment and Plan: You recently have allergic reaction to poison ivy.  I am also prescribing oral prednisone taper dose pack  and hydroxyzine for itching. Your rash should gradually improve. If worsening or expanding please notify us. Due to wide distribution I do think it will be beneficial for you to get depomedrol 40 mg dose tomorrow. We scheduled you for injection tomorrow at 9:30 am.  Follow up in 7 days or as needed.  Follow Up Instructions:    I discussed the assessment and treatment plan with the patient. The patient was provided an opportunity to ask questions and all were answered. The patient agreed with the plan and demonstrated an understanding of the instructions.   The patient was advised to call back or  seek an in-person evaluation if the symptoms worsen or if the condition fails to improve as anticipated.  I provided 25 minutes of non-face-to-face time during this encounter.   Mackie Pai, PA-C   Review of Systems     Objective:   Physical Exam        Assessment & Plan:

## 2020-03-14 NOTE — Patient Instructions (Addendum)
You recently have allergic reaction to poison ivy.  I am also prescribing oral prednisone taper dose pack  and hydroxyzine for itching. Your rash should gradually improve. If worsening or expanding please notify us. Due to wide distribution I do think it will be beneficial for you to get depomedrol 40 mg dose tomorrow. We scheduled you for injection tomorrow at 9:30 am.  Follow up in 7 days or as needed.

## 2020-03-14 NOTE — Telephone Encounter (Signed)
Opened for rooming for VV appointment on 8/30

## 2020-03-15 ENCOUNTER — Ambulatory Visit (INDEPENDENT_AMBULATORY_CARE_PROVIDER_SITE_OTHER): Payer: BC Managed Care – PPO

## 2020-03-15 DIAGNOSIS — T7840XA Allergy, unspecified, initial encounter: Secondary | ICD-10-CM

## 2020-03-15 MED ORDER — METHYLPREDNISOLONE ACETATE 40 MG/ML IJ SUSP
40.0000 mg | Freq: Once | INTRAMUSCULAR | Status: AC
  Administered 2020-03-15: 40 mg via INTRAMUSCULAR

## 2020-03-15 NOTE — Progress Notes (Addendum)
Patient was here for  Depo-Medrol 40 mg injection , and was given right dorsal gluteal.  Patient tolerated well. RC/CMA   Ordered and given for allergic reaction.   Mackie Pai, PA-C

## 2020-03-24 ENCOUNTER — Other Ambulatory Visit: Payer: Self-pay

## 2020-03-24 ENCOUNTER — Encounter (INDEPENDENT_AMBULATORY_CARE_PROVIDER_SITE_OTHER): Payer: Self-pay | Admitting: Family Medicine

## 2020-03-24 ENCOUNTER — Ambulatory Visit (INDEPENDENT_AMBULATORY_CARE_PROVIDER_SITE_OTHER): Payer: BC Managed Care – PPO | Admitting: Family Medicine

## 2020-03-24 VITALS — BP 122/82 | HR 76 | Temp 98.0°F | Ht 70.0 in | Wt 218.0 lb

## 2020-03-24 DIAGNOSIS — E669 Obesity, unspecified: Secondary | ICD-10-CM

## 2020-03-24 DIAGNOSIS — Z9189 Other specified personal risk factors, not elsewhere classified: Secondary | ICD-10-CM

## 2020-03-24 DIAGNOSIS — E559 Vitamin D deficiency, unspecified: Secondary | ICD-10-CM

## 2020-03-24 DIAGNOSIS — R7303 Prediabetes: Secondary | ICD-10-CM | POA: Diagnosis not present

## 2020-03-24 DIAGNOSIS — Z6831 Body mass index (BMI) 31.0-31.9, adult: Secondary | ICD-10-CM | POA: Diagnosis not present

## 2020-03-24 MED ORDER — METFORMIN HCL 500 MG PO TABS: 500.0000 mg | ORAL_TABLET | Freq: Two times a day (BID) | ORAL | 0 refills | Status: DC

## 2020-03-24 MED ORDER — VITAMIN D (ERGOCALCIFEROL) 1.25 MG (50000 UNIT) PO CAPS: 50000.0000 [IU] | ORAL_CAPSULE | ORAL | 0 refills | Status: DC

## 2020-03-24 NOTE — Progress Notes (Signed)
Chief Complaint:   OBESITY Cameron Valenzuela is here to discuss his progress with his obesity treatment plan along with follow-up of his obesity related diagnoses. Cameron Valenzuela is on the Category 4 Plan and states he is following his eating plan approximately 60% of the time. Cameron Valenzuela states he is walking 1 mile.   Today's visit was #: 61 Starting weight: 256 lbs Starting date: 09/18/2018 Today's weight: 218 lbs Today's date: 03/24/2020 Total lbs lost to date: 38 Total lbs lost since last in-office visit: 1  Interim History: Cameron Valenzuela is trying to find creative ways to exercise, and may start walking to work to add in activity. Food wise, breakfast and dinner tends to be the easiest to follow plan. Lunch depends as he is occasionally eating out. He may be okay to start maintenance phase.  Subjective:   1. Pre-diabetes Cameron Valenzuela's last A1c was 5.2 and insulin 18.7. He is on metformin with no GI side effects. He notes occasional carbohydrate cravings.  2. Vitamin D deficiency Cameron Valenzuela denies nausea, vomiting, or muscle weakness, and he is on prescription Vit D.  3. At risk for depression Cameron Valenzuela is at elevated risk of depression due to being off Wellbutrin now, finished last dose within the past week.  Assessment/Plan:   1. Pre-diabetes Cameron Valenzuela will continue to work on weight loss, exercise, and decreasing simple carbohydrates to help decrease the risk of diabetes. We will refill metformin for 1 month.  - metFORMIN (GLUCOPHAGE) 500 MG tablet; Take 1 tablet (500 mg total) by mouth 2 (two) times daily with a meal.  Dispense: 60 tablet; Refill: 0  2. Vitamin D deficiency Low Vitamin D level contributes to fatigue and are associated with obesity, breast, and colon cancer. We will refill prescription Vitamin D for 1 month. Cameron Valenzuela will follow-up for routine testing of Vitamin D, at least 2-3 times per year to avoid over-replacement.  - Vitamin D, Ergocalciferol, (DRISDOL) 1.25 MG (50000 UNIT) CAPS  capsule; Take 1 capsule (50,000 Units total) by mouth every 7 (seven) days.  Dispense: 4 capsule; Refill: 0  3. At risk for depression Cameron Valenzuela was given approximately 15 minutes of depression risk counseling today. He has risk factors for depression. We discussed the importance of a healthy work life balance, a healthy relationship with food and a good support system.  Repetitive spaced learning was employed today to elicit superior memory formation and behavioral change.  4. Class 1 obesity with serious comorbidity and body mass index (BMI) of 31.0 to 31.9 in adult, unspecified obesity type Cameron Valenzuela is currently in the action stage of change. As such, his goal is to continue with weight loss efforts. He has agreed to the Category 4 Plan.   Exercise goals: As is.  Behavioral modification strategies: increasing lean protein intake, increasing vegetables, meal planning and cooking strategies, keeping healthy foods in the home and planning for success.  Cameron Valenzuela has agreed to follow-up with our clinic in 3 weeks. He was informed of the importance of frequent follow-up visits to maximize his success with intensive lifestyle modifications for his multiple health conditions.   Objective:   Blood pressure 122/82, pulse 76, temperature 98 F (36.7 C), temperature source Oral, height 5\' 10"  (1.778 m), weight 218 lb (98.9 kg), SpO2 98 %. Body mass index is 31.28 kg/m.  General: Cooperative, alert, well developed, in no acute distress. HEENT: Conjunctivae and lids unremarkable. Cardiovascular: Regular rhythm.  Lungs: Normal work of breathing. Neurologic: No focal deficits.   Lab Results  Component Value  Date   CREATININE 1.12 12/24/2019   BUN 21 12/24/2019   NA 141 12/24/2019   K 4.6 12/24/2019   CL 102 12/24/2019   CO2 24 12/24/2019   Lab Results  Component Value Date   ALT 31 12/24/2019   AST 28 12/24/2019   ALKPHOS 69 12/24/2019   BILITOT 0.4 12/24/2019   Lab Results  Component  Value Date   HGBA1C 5.2 12/24/2019   HGBA1C 5.1 07/27/2019   HGBA1C 5.0 02/11/2019   HGBA1C 6.2 (H) 09/18/2018   HGBA1C 5.5 06/24/2018   Lab Results  Component Value Date   INSULIN 18.7 12/24/2019   INSULIN 22.7 07/27/2019   INSULIN 16.4 02/11/2019   INSULIN 60.2 (H) 09/18/2018   Lab Results  Component Value Date   TSH 3.960 09/18/2018   Lab Results  Component Value Date   CHOL 224 (H) 12/24/2019   HDL 32 (L) 12/24/2019   LDLCALC 146 (H) 12/24/2019   LDLDIRECT 116.0 06/24/2018   TRIG 253 (H) 12/24/2019   CHOLHDL 8 06/24/2018   Lab Results  Component Value Date   WBC 5.4 09/18/2018   HGB 15.1 09/18/2018   HCT 41.5 09/18/2018   MCV 86 09/18/2018   PLT 176.0 06/24/2018   No results found for: IRON, TIBC, FERRITIN  Attestation Statements:   Reviewed by clinician on day of visit: allergies, medications, problem list, medical history, surgical history, family history, social history, and previous encounter notes.   I, Trixie Dredge, am acting as transcriptionist for Coralie Common, MD.  I have reviewed the above documentation for accuracy and completeness, and I agree with the above. - Jinny Blossom, MD

## 2020-04-19 ENCOUNTER — Encounter (INDEPENDENT_AMBULATORY_CARE_PROVIDER_SITE_OTHER): Payer: Self-pay | Admitting: Family Medicine

## 2020-04-19 ENCOUNTER — Other Ambulatory Visit: Payer: Self-pay

## 2020-04-19 ENCOUNTER — Ambulatory Visit (INDEPENDENT_AMBULATORY_CARE_PROVIDER_SITE_OTHER): Payer: BC Managed Care – PPO | Admitting: Family Medicine

## 2020-04-19 VITALS — BP 121/75 | HR 73 | Temp 98.2°F | Ht 70.0 in | Wt 226.0 lb

## 2020-04-19 DIAGNOSIS — Z6832 Body mass index (BMI) 32.0-32.9, adult: Secondary | ICD-10-CM | POA: Diagnosis not present

## 2020-04-19 DIAGNOSIS — E669 Obesity, unspecified: Secondary | ICD-10-CM | POA: Diagnosis not present

## 2020-04-19 DIAGNOSIS — E559 Vitamin D deficiency, unspecified: Secondary | ICD-10-CM

## 2020-04-19 DIAGNOSIS — E7849 Other hyperlipidemia: Secondary | ICD-10-CM | POA: Diagnosis not present

## 2020-04-19 NOTE — Progress Notes (Signed)
Chief Complaint:   OBESITY Cameron Valenzuela is here to discuss his progress with his obesity treatment plan along with follow-up of his obesity related diagnoses. Cameron Valenzuela is on the Category 4 Plan and states he is following his eating plan approximately 50% of the time. Cameron Valenzuela states he is walking 10,000-15,000 steps.   Today's visit was #: 30 Starting weight: 256 lbs Starting date: 09/18/2018 Today's weight: 226 lbs Today's date: 04/19/2020 Total lbs lost to date: 30 Total lbs lost since last in-office visit: 0  Interim History: Cameron Valenzuela voices this past week he walked quite a bit but was away on work and ate off the plan. He voices he also did some emotional eating as well. His wife is now on board with meal planning He realizes that he may have been too indulgent while away with his eating.  Subjective:   1. Vitamin D deficiency Erling denies nausea, vomiting, or muscle weakness, but he notes fatigue. He is on prescription Vit D.  2. Other hyperlipidemia Cameron Valenzuela's last LDL was 146, triglycerides 253, and HDL 32. He is not on statin.  Assessment/Plan:   1. Vitamin D deficiency Low Vitamin D level contributes to fatigue and are associated with obesity, breast, and colon cancer. Cameron Valenzuela agreed to continue taking prescription Vitamin D 50,000 IU every week, no refill needed. He will follow-up for routine testing of Vitamin D, at least 2-3 times per year to avoid over-replacement.  2. Other hyperlipidemia Cardiovascular risk and specific lipid/LDL goals reviewed. We discussed several lifestyle modifications today. Cameron Valenzuela will continue to work on diet, exercise and weight loss efforts. We will follow up on labs at his next appointment. Orders and follow up as documented in patient record.   Counseling Intensive lifestyle modifications are the first line treatment for this issue. . Dietary changes: Increase soluble fiber. Decrease simple carbohydrates. . Exercise changes: Moderate to  vigorous-intensity aerobic activity 150 minutes per week if tolerated. . Lipid-lowering medications: see documented in medical record.  3. Class 1 obesity with serious comorbidity and body mass index (BMI) of 32.0 to 32.9 in adult, unspecified obesity type Cameron Valenzuela is currently in the action stage of change. As such, his goal is to continue with weight loss efforts. He has agreed to the Category 4 Plan and keeping a food journal and adhering to recommended goals of 550-700 calories and 45+ grams of protein at supper daily.   Exercise goals: As is.  Behavioral modification strategies: increasing lean protein intake, meal planning and cooking strategies, keeping healthy foods in the home, planning for success and keeping a strict food journal.  Cameron Valenzuela has agreed to follow-up with our clinic in 3 weeks. He was informed of the importance of frequent follow-up visits to maximize his success with intensive lifestyle modifications for his multiple health conditions.   Objective:   Blood pressure 121/75, pulse 73, temperature 98.2 F (36.8 C), temperature source Oral, height 5\' 10"  (1.778 m), weight 226 lb (102.5 kg), SpO2 99 %. Body mass index is 32.43 kg/m.  General: Cooperative, alert, well developed, in no acute distress. HEENT: Conjunctivae and lids unremarkable. Cardiovascular: Regular rhythm.  Lungs: Normal work of breathing. Neurologic: No focal deficits.   Lab Results  Component Value Date   CREATININE 1.12 12/24/2019   BUN 21 12/24/2019   NA 141 12/24/2019   K 4.6 12/24/2019   CL 102 12/24/2019   CO2 24 12/24/2019   Lab Results  Component Value Date   ALT 31 12/24/2019   AST  28 12/24/2019   ALKPHOS 69 12/24/2019   BILITOT 0.4 12/24/2019   Lab Results  Component Value Date   HGBA1C 5.2 12/24/2019   HGBA1C 5.1 07/27/2019   HGBA1C 5.0 02/11/2019   HGBA1C 6.2 (H) 09/18/2018   HGBA1C 5.5 06/24/2018   Lab Results  Component Value Date   INSULIN 18.7 12/24/2019    INSULIN 22.7 07/27/2019   INSULIN 16.4 02/11/2019   INSULIN 60.2 (H) 09/18/2018   Lab Results  Component Value Date   TSH 3.960 09/18/2018   Lab Results  Component Value Date   CHOL 224 (H) 12/24/2019   HDL 32 (L) 12/24/2019   LDLCALC 146 (H) 12/24/2019   LDLDIRECT 116.0 06/24/2018   TRIG 253 (H) 12/24/2019   CHOLHDL 8 06/24/2018   Lab Results  Component Value Date   WBC 5.4 09/18/2018   HGB 15.1 09/18/2018   HCT 41.5 09/18/2018   MCV 86 09/18/2018   PLT 176.0 06/24/2018   No results found for: IRON, TIBC, FERRITIN  Attestation Statements:   Reviewed by clinician on day of visit: allergies, medications, problem list, medical history, surgical history, family history, social history, and previous encounter notes.  Time spent on visit including pre-visit chart review and post-visit care and charting was 16 minutes.    I, Trixie Dredge, am acting as transcriptionist for Coralie Common, MD.  I have reviewed the above documentation for accuracy and completeness, and I agree with the above. - Jinny Blossom, MD

## 2020-05-07 ENCOUNTER — Other Ambulatory Visit (INDEPENDENT_AMBULATORY_CARE_PROVIDER_SITE_OTHER): Payer: Self-pay | Admitting: Family Medicine

## 2020-05-07 DIAGNOSIS — R7303 Prediabetes: Secondary | ICD-10-CM

## 2020-05-10 ENCOUNTER — Ambulatory Visit (INDEPENDENT_AMBULATORY_CARE_PROVIDER_SITE_OTHER): Payer: BC Managed Care – PPO | Admitting: Family Medicine

## 2020-05-10 ENCOUNTER — Other Ambulatory Visit: Payer: Self-pay

## 2020-05-10 ENCOUNTER — Encounter (INDEPENDENT_AMBULATORY_CARE_PROVIDER_SITE_OTHER): Payer: Self-pay | Admitting: Family Medicine

## 2020-05-10 VITALS — BP 115/72 | HR 89 | Temp 98.3°F | Ht 70.0 in | Wt 224.0 lb

## 2020-05-10 DIAGNOSIS — Z9189 Other specified personal risk factors, not elsewhere classified: Secondary | ICD-10-CM | POA: Diagnosis not present

## 2020-05-10 DIAGNOSIS — R7303 Prediabetes: Secondary | ICD-10-CM

## 2020-05-10 DIAGNOSIS — E669 Obesity, unspecified: Secondary | ICD-10-CM | POA: Diagnosis not present

## 2020-05-10 DIAGNOSIS — E559 Vitamin D deficiency, unspecified: Secondary | ICD-10-CM

## 2020-05-10 DIAGNOSIS — E7849 Other hyperlipidemia: Secondary | ICD-10-CM | POA: Diagnosis not present

## 2020-05-10 DIAGNOSIS — Z6832 Body mass index (BMI) 32.0-32.9, adult: Secondary | ICD-10-CM

## 2020-05-10 MED ORDER — VITAMIN D (ERGOCALCIFEROL) 1.25 MG (50000 UNIT) PO CAPS: 50000.0000 [IU] | ORAL_CAPSULE | ORAL | 0 refills | Status: DC

## 2020-05-10 MED ORDER — METFORMIN HCL 500 MG PO TABS: 500.0000 mg | ORAL_TABLET | Freq: Two times a day (BID) | ORAL | 0 refills | Status: DC

## 2020-05-11 LAB — LIPID PANEL WITH LDL/HDL RATIO
Cholesterol, Total: 218 mg/dL — ABNORMAL HIGH (ref 100–199)
HDL: 30 mg/dL — ABNORMAL LOW
LDL Chol Calc (NIH): 127 mg/dL — ABNORMAL HIGH (ref 0–99)
LDL/HDL Ratio: 4.2 ratio — ABNORMAL HIGH (ref 0.0–3.6)
Triglycerides: 344 mg/dL — ABNORMAL HIGH (ref 0–149)
VLDL Cholesterol Cal: 61 mg/dL — ABNORMAL HIGH (ref 5–40)

## 2020-05-11 LAB — COMPREHENSIVE METABOLIC PANEL
ALT: 33 IU/L (ref 0–44)
AST: 22 IU/L (ref 0–40)
Albumin/Globulin Ratio: 2 (ref 1.2–2.2)
Albumin: 4.5 g/dL (ref 4.0–5.0)
Alkaline Phosphatase: 68 IU/L (ref 44–121)
BUN/Creatinine Ratio: 22 — ABNORMAL HIGH (ref 9–20)
BUN: 20 mg/dL (ref 6–24)
Bilirubin Total: 0.4 mg/dL (ref 0.0–1.2)
CO2: 25 mmol/L (ref 20–29)
Calcium: 9.2 mg/dL (ref 8.7–10.2)
Chloride: 103 mmol/L (ref 96–106)
Creatinine, Ser: 0.93 mg/dL (ref 0.76–1.27)
GFR calc Af Amer: 110 mL/min/{1.73_m2} (ref >59)
GFR calc non Af Amer: 95 mL/min/{1.73_m2} (ref >59)
Globulin, Total: 2.2 g/dL (ref 1.5–4.5)
Glucose: 91 mg/dL (ref 65–99)
Potassium: 4.4 mmol/L (ref 3.5–5.2)
Sodium: 141 mmol/L (ref 134–144)
Total Protein: 6.7 g/dL (ref 6.0–8.5)

## 2020-05-11 LAB — HEMOGLOBIN A1C
Est. average glucose Bld gHb Est-mCnc: 108 mg/dL
Hgb A1c MFr Bld: 5.4 % (ref 4.8–5.6)

## 2020-05-11 LAB — VITAMIN B12: Vitamin B-12: 559 pg/mL (ref 232–1245)

## 2020-05-11 LAB — INSULIN, RANDOM: INSULIN: 25 u[IU]/mL — ABNORMAL HIGH (ref 2.6–24.9)

## 2020-05-11 LAB — VITAMIN D 25 HYDROXY (VIT D DEFICIENCY, FRACTURES): Vit D, 25-Hydroxy: 38.5 ng/mL (ref 30.0–100.0)

## 2020-05-11 NOTE — Progress Notes (Signed)
Chief Complaint:   OBESITY Cameron Valenzuela is here to discuss his progress with his obesity treatment plan along with follow-up of his obesity related diagnoses. Cameron Valenzuela is on the Category 4 Plan and keeping a food journal and adhering to recommended goals of 550-700 calories and 45+ grams of protein at supper daily and states he is following his eating plan approximately 60% of the time. Cameron Valenzuela states he is at the gym for 30-60 minutes 2 times per week.  Today's visit was #: 10 Starting weight: 256 lbs Starting date: 09/18/2018 Today's weight: 224 lbs Today's date: 05/10/2020 Total lbs lost to date: 32 Total lbs lost since last in-office visit: 2  Interim History: Cameron Valenzuela has had to travel for work so he has had to eat out more frequently than he would have liked. He finds choosing nutrition valid options to not necessarily be choices that he is making. He has been exercising more. 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  Subjective:   1. Vitamin D deficiency Cameron Valenzuela denies nausea, vomiting, or muscle weakness, but notes fatigue. He is on prescription Vit D. Last Vit D level was 44.3.  2. Pre-diabetes Cameron Valenzuela's last A1c was 5.2 and insulin 18.7. He is on metformin BID.  3. Other hyperlipidemia Cameron Valenzuela's last LDL was 146, HDL 32, and triglycerides 253. He is not on medications.  4. At risk for heart disease Cameron Valenzuela is at a higher than average risk for cardiovascular disease due to obesity.   Assessment/Plan:   1. Vitamin D deficiency Low Vitamin D level contributes to fatigue and are associated with obesity, breast, and colon cancer. We will check labs today, and we will refill prescription Vitamin D for 1 month. Koron will follow-up for routine testing of Vitamin D, at least 2-3 times per year to avoid over-replacement.  - Vitamin D, Ergocalciferol, (DRISDOL) 1.25 MG (50000 UNIT) CAPS capsule; Take 1 capsule (50,000 Units total) by mouth every 7 (seven) days.  Dispense: 4  capsule; Refill: 0 - VITAMIN D 25 Hydroxy (Vit-D Deficiency, Fractures)  2. Pre-diabetes Nobuo will continue to work on weight loss, exercise, and decreasing simple carbohydrates to help decrease the risk of diabetes. We will check labs today, and we will refill metformin for 1 month.  - metFORMIN (GLUCOPHAGE) 500 MG tablet; Take 1 tablet (500 mg total) by mouth 2 (two) times daily with a meal.  Dispense: 60 tablet; Refill: 0 - Comprehensive metabolic panel - Hemoglobin A1c - Insulin, random - Vitamin B12  3. Other hyperlipidemia Cardiovascular risk and specific lipid/LDL goals reviewed. We discussed several lifestyle modifications today and Spero will continue to work on diet, exercise and weight loss efforts. We will check labs today. Orders and follow up as documented in patient record.   Counseling Intensive lifestyle modifications are the first line treatment for this issue. . Dietary changes: Increase soluble fiber. Decrease simple carbohydrates. . Exercise changes: Moderate to vigorous-intensity aerobic activity 150 minutes per week if tolerated. . Lipid-lowering medications: see documented in medical record.  - Lipid Panel With LDL/HDL Ratio  4. At risk for heart disease Ruffus was given approximately 15 minutes of coronary artery disease prevention counseling today. He is 49 y.o. male and has risk factors for heart disease including obesity. We discussed intensive lifestyle modifications today with an emphasis on specific weight loss instructions and strategies.   Repetitive spaced learning was employed today to elicit superior memory formation and behavioral change.  5. Class 1 obesity with serious comorbidity and  body mass index (BMI) of 32.0 to 32.9 in adult, unspecified obesity type Cameron Valenzuela is currently in the action stage of change. As such, his goal is to continue with weight loss efforts. He has agreed to the Category 4 Plan.   Exercise goals: As is.  Behavioral  modification strategies: increasing lean protein intake, meal planning and cooking strategies, keeping healthy foods in the home and planning for success.  Cameron Valenzuela has agreed to follow-up with our clinic in 2 to 3 weeks. He was informed of the importance of frequent follow-up visits to maximize his success with intensive lifestyle modifications for his multiple health conditions.   [name] was informed we would discuss his lab results at his next visit unless there is a critical issue that needs to be addressed sooner. Cameron Valenzuela agreed to keep his next visit at the agreed upon time to discuss these results.  Objective:   Blood pressure 115/72, pulse 89, temperature 98.3 F (36.8 C), temperature source Oral, height 5\' 10"  (1.778 m), weight 224 lb (101.6 kg), SpO2 94 %. Body mass index is 32.14 kg/m.  General: Cooperative, alert, well developed, in no acute distress. HEENT: Conjunctivae and lids unremarkable. Cardiovascular: Regular rhythm.  Lungs: Normal work of breathing. Neurologic: No focal deficits.   Lab Results  Component Value Date   CREATININE 0.93 05/10/2020   BUN 20 05/10/2020   NA 141 05/10/2020   K 4.4 05/10/2020   CL 103 05/10/2020   CO2 25 05/10/2020   Lab Results  Component Value Date   ALT 33 05/10/2020   AST 22 05/10/2020   ALKPHOS 68 05/10/2020   BILITOT 0.4 05/10/2020   Lab Results  Component Value Date   HGBA1C 5.4 05/10/2020   HGBA1C 5.2 12/24/2019   HGBA1C 5.1 07/27/2019   HGBA1C 5.0 02/11/2019   HGBA1C 6.2 (H) 09/18/2018   Lab Results  Component Value Date   INSULIN 25.0 (H) 05/10/2020   INSULIN 18.7 12/24/2019   INSULIN 22.7 07/27/2019   INSULIN 16.4 02/11/2019   INSULIN 60.2 (H) 09/18/2018   Lab Results  Component Value Date   TSH 3.960 09/18/2018   Lab Results  Component Value Date   CHOL 218 (H) 05/10/2020   HDL 30 (L) 05/10/2020   LDLCALC 127 (H) 05/10/2020   LDLDIRECT 116.0 06/24/2018   TRIG 344 (H) 05/10/2020   CHOLHDL 8  06/24/2018   Lab Results  Component Value Date   WBC 5.4 09/18/2018   HGB 15.1 09/18/2018   HCT 41.5 09/18/2018   MCV 86 09/18/2018   PLT 176.0 06/24/2018   No results found for: IRON, TIBC, FERRITIN  Attestation Statements:   Reviewed by clinician on day of visit: allergies, medications, problem list, medical history, surgical history, family history, social history, and previous encounter notes.   I, Trixie Dredge, am acting as transcriptionist for Coralie Common, MD.  I have reviewed the above documentation for accuracy and completeness, and I agree with the above. - Jinny Blossom, MD

## 2020-05-30 ENCOUNTER — Encounter (INDEPENDENT_AMBULATORY_CARE_PROVIDER_SITE_OTHER): Payer: Self-pay | Admitting: Family Medicine

## 2020-05-30 ENCOUNTER — Other Ambulatory Visit: Payer: Self-pay

## 2020-05-30 ENCOUNTER — Ambulatory Visit (INDEPENDENT_AMBULATORY_CARE_PROVIDER_SITE_OTHER): Payer: BC Managed Care – PPO | Admitting: Family Medicine

## 2020-05-30 VITALS — BP 129/79 | HR 87 | Temp 98.1°F | Ht 70.0 in | Wt 225.0 lb

## 2020-05-30 DIAGNOSIS — E7849 Other hyperlipidemia: Secondary | ICD-10-CM

## 2020-05-30 DIAGNOSIS — E559 Vitamin D deficiency, unspecified: Secondary | ICD-10-CM

## 2020-05-30 DIAGNOSIS — Z6832 Body mass index (BMI) 32.0-32.9, adult: Secondary | ICD-10-CM

## 2020-05-30 DIAGNOSIS — E669 Obesity, unspecified: Secondary | ICD-10-CM

## 2020-06-02 NOTE — Progress Notes (Signed)
Chief Complaint:   OBESITY Cameron Valenzuela is here to discuss his progress with his obesity treatment plan along with follow-up of his obesity related diagnoses. Cameron Valenzuela is on the Category 4 Plan and states he is following his eating plan approximately 50% of the time. Cameron Valenzuela states he is walking 10,000 steps 5 times per week.  Today's visit was #: 85 Starting weight: 256 lbs Starting date: 09/18/2018 Today's weight: 225 lbs Today's date: 05/30/2020 Total lbs lost to date: 31 Total lbs lost since last in-office visit: 0  Interim History: Cameron Valenzuela has been stress eating quite a bit. A lot of stress at work and eating out frequently. He realizes he is fallen into some old habits. Still short staffed but he does anticipate some improvement with work stress. Lunch is hit or miss when it comes to following the plan, but breakfast is on the plan everyday.   Subjective:   1. Other hyperlipidemia Mccormick's last LDL was 127, HDL 30, and triglycerides 344. He is not on statin, and he has a family history of elevated triglycerides.  2. Vitamin D deficiency Cameron Valenzuela was out of Vit D previously, and he notes fatigue.  Assessment/Plan:   1. Other hyperlipidemia Cardiovascular risk and specific lipid/LDL goals reviewed. We discussed several lifestyle modifications today and Cameron Valenzuela will continue to work on diet, exercise and weight loss efforts. We will repeat labs in February. If triglycerides are still elevated then we will increase the start of fenofibrate. Orders and follow up as documented in patient record.   Counseling Intensive lifestyle modifications are the first line treatment for this issue. . Dietary changes: Increase soluble fiber. Decrease simple carbohydrates. . Exercise changes: Moderate to vigorous-intensity aerobic activity 150 minutes per week if tolerated. . Lipid-lowering medications: see documented in medical record.  2. Vitamin D deficiency Low Vitamin D level contributes to  fatigue and are associated with obesity, breast, and colon cancer. [name] agreed to continue taking prescription Vitamin D 50,000 IU every week, and we will repeat labs in February. He will follow-up for routine testing of Vitamin D, at least 2-3 times per year to avoid over-replacement.  3. Class 1 obesity with serious comorbidity and body mass index (BMI) of 32.0 to 32.9 in adult, unspecified obesity type Cameron Valenzuela is currently in the action stage of change. As such, his goal is to continue with weight loss efforts. He has agreed to the Category 4 Plan.   Cameron Valenzuela is to eat at home 4 nights per week, and work on having food in the house.  Exercise goals: As is.  Behavioral modification strategies: increasing lean protein intake, meal planning and cooking strategies, keeping healthy foods in the home, holiday eating strategies  and planning for success.  Cameron Valenzuela has agreed to follow-up with our clinic in 3 to 4 weeks. He was informed of the importance of frequent follow-up visits to maximize his success with intensive lifestyle modifications for his multiple health conditions.   Objective:   Blood pressure 129/79, pulse 87, temperature 98.1 F (36.7 C), temperature source Oral, height 5\' 10"  (1.778 m), weight 225 lb (102.1 kg), SpO2 100 %. Body mass index is 32.28 kg/m.  General: Cooperative, alert, well developed, in no acute distress. HEENT: Conjunctivae and lids unremarkable. Cardiovascular: Regular rhythm.  Lungs: Normal work of breathing. Neurologic: No focal deficits.   Lab Results  Component Value Date   CREATININE 0.93 05/10/2020   BUN 20 05/10/2020   NA 141 05/10/2020   K 4.4 05/10/2020  CL 103 05/10/2020   CO2 25 05/10/2020   Lab Results  Component Value Date   ALT 33 05/10/2020   AST 22 05/10/2020   ALKPHOS 68 05/10/2020   BILITOT 0.4 05/10/2020   Lab Results  Component Value Date   HGBA1C 5.4 05/10/2020   HGBA1C 5.2 12/24/2019   HGBA1C 5.1 07/27/2019    HGBA1C 5.0 02/11/2019   HGBA1C 6.2 (H) 09/18/2018   Lab Results  Component Value Date   INSULIN 25.0 (H) 05/10/2020   INSULIN 18.7 12/24/2019   INSULIN 22.7 07/27/2019   INSULIN 16.4 02/11/2019   INSULIN 60.2 (H) 09/18/2018   Lab Results  Component Value Date   TSH 3.960 09/18/2018   Lab Results  Component Value Date   CHOL 218 (H) 05/10/2020   HDL 30 (L) 05/10/2020   LDLCALC 127 (H) 05/10/2020   LDLDIRECT 116.0 06/24/2018   TRIG 344 (H) 05/10/2020   CHOLHDL 8 06/24/2018   Lab Results  Component Value Date   WBC 5.4 09/18/2018   HGB 15.1 09/18/2018   HCT 41.5 09/18/2018   MCV 86 09/18/2018   PLT 176.0 06/24/2018   No results found for: IRON, TIBC, FERRITIN  Attestation Statements:   Reviewed by clinician on day of visit: allergies, medications, problem list, medical history, surgical history, family history, social history, and previous encounter notes.  Time spent on visit including pre-visit chart review and post-visit care and charting was 16 minutes.    I, Trixie Dredge, am acting as transcriptionist for Coralie Common, MD. I have reviewed the above documentation for accuracy and completeness, and I agree with the above. - Jinny Blossom, MD

## 2020-06-07 ENCOUNTER — Other Ambulatory Visit (INDEPENDENT_AMBULATORY_CARE_PROVIDER_SITE_OTHER): Payer: Self-pay | Admitting: Family Medicine

## 2020-06-07 ENCOUNTER — Encounter (INDEPENDENT_AMBULATORY_CARE_PROVIDER_SITE_OTHER): Payer: Self-pay

## 2020-06-07 DIAGNOSIS — E559 Vitamin D deficiency, unspecified: Secondary | ICD-10-CM

## 2020-06-07 NOTE — Telephone Encounter (Signed)
MyChart message sent to pt to find out if they have enough medication to get them through until next appt.   

## 2020-06-07 NOTE — Telephone Encounter (Signed)
This patient was last seen by Dr. Jearld Shines, and currently has an upcoming appt scheduled on 06/29/20 with her.

## 2020-06-10 ENCOUNTER — Other Ambulatory Visit (INDEPENDENT_AMBULATORY_CARE_PROVIDER_SITE_OTHER): Payer: Self-pay | Admitting: Family Medicine

## 2020-06-10 DIAGNOSIS — R7303 Prediabetes: Secondary | ICD-10-CM

## 2020-06-13 NOTE — Telephone Encounter (Signed)
This patient was last seen by Dr. Jearld Shines, and currently has an upcoming appt scheduled on 06/29/20 with her.

## 2020-06-14 ENCOUNTER — Encounter (INDEPENDENT_AMBULATORY_CARE_PROVIDER_SITE_OTHER): Payer: Self-pay

## 2020-06-14 NOTE — Telephone Encounter (Signed)
MyChart message sent to pt to find out if they have enough medication to get them through until next appt.   

## 2020-06-29 ENCOUNTER — Ambulatory Visit (INDEPENDENT_AMBULATORY_CARE_PROVIDER_SITE_OTHER): Payer: BC Managed Care – PPO | Admitting: Family Medicine

## 2020-06-29 ENCOUNTER — Other Ambulatory Visit: Payer: Self-pay

## 2020-06-29 ENCOUNTER — Encounter (INDEPENDENT_AMBULATORY_CARE_PROVIDER_SITE_OTHER): Payer: Self-pay | Admitting: Family Medicine

## 2020-06-29 VITALS — BP 114/71 | HR 78 | Temp 98.0°F | Ht 70.0 in | Wt 230.0 lb

## 2020-06-29 DIAGNOSIS — E559 Vitamin D deficiency, unspecified: Secondary | ICD-10-CM

## 2020-06-29 DIAGNOSIS — Z9189 Other specified personal risk factors, not elsewhere classified: Secondary | ICD-10-CM

## 2020-06-29 DIAGNOSIS — E669 Obesity, unspecified: Secondary | ICD-10-CM | POA: Diagnosis not present

## 2020-06-29 DIAGNOSIS — Z6833 Body mass index (BMI) 33.0-33.9, adult: Secondary | ICD-10-CM

## 2020-06-29 DIAGNOSIS — R7303 Prediabetes: Secondary | ICD-10-CM | POA: Diagnosis not present

## 2020-06-29 MED ORDER — METFORMIN HCL 500 MG PO TABS: 500.0000 mg | ORAL_TABLET | Freq: Two times a day (BID) | ORAL | 0 refills | Status: DC

## 2020-06-29 MED ORDER — VITAMIN D (ERGOCALCIFEROL) 1.25 MG (50000 UNIT) PO CAPS: 50000.0000 [IU] | ORAL_CAPSULE | ORAL | 0 refills | Status: DC

## 2020-06-29 NOTE — Progress Notes (Signed)
Chief Complaint:   OBESITY Cameron Valenzuela is here to discuss his progress with his obesity treatment plan along with follow-up of his obesity related diagnoses. Cameron Valenzuela is on the Category 4 Plan and states he is following his eating plan approximately 50-60% of the time. Cameron Valenzuela states he is walking 10,000 steps 5 times per week.  Today's visit was #: 22 Starting weight: 256 lbs Starting date: 09/18/2018 Today's weight: 230 lbs Today's date: 06/29/2020 Total lbs lost to date: 26 Total lbs lost since last in-office visit: 0  Interim History: Cameron Valenzuela has had a busy few weeks, and had an activity filled and indulgent eating filled few weeks. He is going to [place] for a few days after Christmas. He does have a few holiday parties planned for the next few weeks. Has been eating out more secondary to schedule. He does anticipate eating out quite a bit when traveling as well.  Subjective:   1. Pre-diabetes Cameron Valenzuela's last A1c was 5.4 and insulin 25.0. He is on metformin BID and denies GI side effects.  2. Vitamin D deficiency Cameron Valenzuela denies nausea, vomiting, or muscle weakness, but notes fatigue. He is on prescription Vit D.  3. At risk for diabetes mellitus Cameron Valenzuela is at higher than average risk for developing diabetes due to obesity.   Assessment/Plan:   1. Pre-diabetes Cameron Valenzuela will continue to work on weight loss, exercise, and decreasing simple carbohydrates to help decrease the risk of diabetes. We will refill metformin for 1 month.  - metFORMIN (GLUCOPHAGE) 500 MG tablet; Take 1 tablet (500 mg total) by mouth 2 (two) times daily with a meal.  Dispense: 60 tablet; Refill: 0  2. Vitamin D deficiency Low Vitamin D level contributes to fatigue and are associated with obesity, breast, and colon cancer. We will refill prescription Vitamin D for 1 month. Cameron Valenzuela will follow-up for routine testing of Vitamin D, at least 2-3 times per year to avoid over-replacement.  - Vitamin D,  Ergocalciferol, (DRISDOL) 1.25 MG (50000 UNIT) CAPS capsule; Take 1 capsule (50,000 Units total) by mouth every 7 (seven) days.  Dispense: 4 capsule; Refill: 0  3. At risk for diabetes mellitus Cameron Valenzuela was given approximately 15 minutes of diabetes education and counseling today. We discussed intensive lifestyle modifications today with an emphasis on weight loss as well as increasing exercise and decreasing simple carbohydrates in his diet. We also reviewed medication options with an emphasis on risk versus benefit of those discussed.   Repetitive spaced learning was employed today to elicit superior memory formation and behavioral change.  4. Class 1 obesity with serious comorbidity and body mass index (BMI) of 33.0 to 33.9 in adult, unspecified obesity type Cameron Valenzuela is currently in the action stage of change. As such, his goal is to continue with weight loss efforts. He has agreed to the Category 4 Plan.   Exercise goals: All adults should avoid inactivity. Some physical activity is better than none, and adults who participate in any amount of physical activity gain some health benefits.  Behavioral modification strategies: increasing lean protein intake, meal planning and cooking strategies, travel eating strategies, holiday eating strategies  and planning for success.  Cameron Valenzuela has agreed to follow-up with our clinic in 3 weeks. He was informed of the importance of frequent follow-up visits to maximize his success with intensive lifestyle modifications for his multiple health conditions.   Objective:   Blood pressure 114/71, pulse 78, temperature 98 F (36.7 C), temperature source Oral, height 5\' 10"  (1.778 m),  weight 230 lb (104.3 kg), SpO2 99 %. Body mass index is 33 kg/m.  General: Cooperative, alert, well developed, in no acute distress. HEENT: Conjunctivae and lids unremarkable. Cardiovascular: Regular rhythm.  Lungs: Normal work of breathing. Neurologic: No focal deficits.   Lab  Results  Component Value Date   CREATININE 0.93 05/10/2020   BUN 20 05/10/2020   NA 141 05/10/2020   K 4.4 05/10/2020   CL 103 05/10/2020   CO2 25 05/10/2020   Lab Results  Component Value Date   ALT 33 05/10/2020   AST 22 05/10/2020   ALKPHOS 68 05/10/2020   BILITOT 0.4 05/10/2020   Lab Results  Component Value Date   HGBA1C 5.4 05/10/2020   HGBA1C 5.2 12/24/2019   HGBA1C 5.1 07/27/2019   HGBA1C 5.0 02/11/2019   HGBA1C 6.2 (H) 09/18/2018   Lab Results  Component Value Date   INSULIN 25.0 (H) 05/10/2020   INSULIN 18.7 12/24/2019   INSULIN 22.7 07/27/2019   INSULIN 16.4 02/11/2019   INSULIN 60.2 (H) 09/18/2018   Lab Results  Component Value Date   TSH 3.960 09/18/2018   Lab Results  Component Value Date   CHOL 218 (H) 05/10/2020   HDL 30 (L) 05/10/2020   LDLCALC 127 (H) 05/10/2020   LDLDIRECT 116.0 06/24/2018   TRIG 344 (H) 05/10/2020   CHOLHDL 8 06/24/2018   Lab Results  Component Value Date   WBC 5.4 09/18/2018   HGB 15.1 09/18/2018   HCT 41.5 09/18/2018   MCV 86 09/18/2018   PLT 176.0 06/24/2018   No results found for: IRON, TIBC, FERRITIN   Attestation Statements:   Reviewed by clinician on day of visit: allergies, medications, problem list, medical history, surgical history, family history, social history, and previous encounter notes.   I, Trixie Dredge, am acting as transcriptionist for Coralie Common, MD.  I have reviewed the above documentation for accuracy and completeness, and I agree with the above. - Jinny Blossom, MD

## 2020-07-26 ENCOUNTER — Other Ambulatory Visit (INDEPENDENT_AMBULATORY_CARE_PROVIDER_SITE_OTHER): Payer: Self-pay | Admitting: Family Medicine

## 2020-07-26 ENCOUNTER — Ambulatory Visit (INDEPENDENT_AMBULATORY_CARE_PROVIDER_SITE_OTHER): Payer: BC Managed Care – PPO | Admitting: Family Medicine

## 2020-07-26 DIAGNOSIS — E559 Vitamin D deficiency, unspecified: Secondary | ICD-10-CM

## 2020-07-27 ENCOUNTER — Ambulatory Visit: Payer: 59 | Admitting: Neurology

## 2020-07-27 ENCOUNTER — Encounter: Payer: Self-pay | Admitting: Neurology

## 2020-07-27 VITALS — BP 136/72 | HR 83 | Ht 70.0 in | Wt 234.0 lb

## 2020-07-27 DIAGNOSIS — E8881 Metabolic syndrome: Secondary | ICD-10-CM

## 2020-07-27 DIAGNOSIS — G4733 Obstructive sleep apnea (adult) (pediatric): Secondary | ICD-10-CM | POA: Diagnosis not present

## 2020-07-27 DIAGNOSIS — Z9989 Dependence on other enabling machines and devices: Secondary | ICD-10-CM | POA: Diagnosis not present

## 2020-07-27 DIAGNOSIS — E6609 Other obesity due to excess calories: Secondary | ICD-10-CM | POA: Diagnosis not present

## 2020-07-27 DIAGNOSIS — Z6830 Body mass index (BMI) 30.0-30.9, adult: Secondary | ICD-10-CM

## 2020-07-27 NOTE — Patient Instructions (Signed)

## 2020-07-27 NOTE — Progress Notes (Signed)
[facility]  PATIENT: Cameron Valenzuela DOB: [birth year]   REASON FOR VISIT: Follow-up for newly diagnosed obstructive sleep apnea with initial CPAP HISTORY FROM: Patient   INTERVAL HISTORY OF PRESENT ILLNESS:  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  [name] has been a highly compliant CPAP user in the past and again there is a 97% compliance user of CPAP was 20 out of 30 days and 80% of these days over 4 hours consecutively.  His average use of CPAP at night is 6 hours 7 minutes he is using an AutoSet air sense 10 by [employer], minimum pressure is 7 maximum pressure 16 cmH2O there is 2 cm expiratory pressure relief.  The serial number is [id number].  His residual AHI was 4.9 /h today which is a little higher than I like and all residual apneas are obstructive in nature based on this I would like to offer to increase the maximum pressure from 16 to 18 cm.   His machine documents that at the 95th percentile he requires 15.5 cm water pressure at night to control his apnea and since his maximum pressure is set at 16 was 2 cm proximal expiratory pressure relief he is straddling very close to that maximum border.  There are also high air leaks which can lead to erroneous apnea counts.  There is no evidence of Cheyne-Stokes respiration.      [name] , a 50 year old Caucasian Freight forwarder of a [place] is seen here on 07-27-2019. [name] was infected with coronavirus in May of 2020, but had a mild course.  He continued to use CPAP since June and he felt that CPAP actually may have helped him to overcome the symptoms of COVID-19.  I reviewed his download which shows that he is a very compliant patient he has used the machine 20 out of 30 days and 26 of those days over 4 hours reaching a compliance of 87%.  The average user time is 6 hours 3 minutes uses an AutoSet between a  minimum pressure of 7 maximum pressure of 13 cmH2O and 3 cm EPR however his residual AHI reflects an apnea index of 9.8/h of which 9.1 are obstructive in nature.  His 95th percentile pressure need is 12.9 cmH2O straddling the upper setting.  I would like to increase his maximum pressure from 13-16 cmH2O there has been no major air leakage so his settings and his supplies do not have to be changed otherwise.     CD/Interval history from 28 July 2018.  I have the pleasure of meeting Cameron Valenzuela today a 50 year old established CPAP patient in our practice who is followed for his primary care needs by Dr. Belinda Fisher, MD.  He was last seen in April 2019 with the first compliance data gathered from his auto titration device after he underwent a sleep study and was diagnosed with sleep apnea.  His current AutoSet pressures between 7 and 13 cmH2O, with an expiratory pressure relief of 3 cmH2O.  He has a high snoring index while using CPAP which is unusual.  His apnea index is 2.7, his hypopnea index 3.6 but he has an RDI of 2.5 on top of these 2 data.  The 95th percentile pressure is 13 cmH2O as prescribed, the residual AHI total is 6.3 and a little bit too high.  UPDATE 4/15/2019CM [name], 50 year old male returns for follow-up with newly diagnosed obstructive sleep apnea here for initial CPAP compliance.  He denies difficulty adjusting to the machine.  Compliance data dated 09/24/2017-10/23/2017 shows compliance data greater than 4 hours at 97%.  Average usage 6 hours 8 minutes.  Set pressure 7-12 cm.  EPR level 3.  AHI 6.9.  ESS 4. FSS 36.  He returns for reevaluation. PLAN: CPAP compliance 97% Due to AHI of 6.9 we will increase max pressure slightly to 13 cm  Follow-up in 4 months for repeat compliance [name], GNP, [facility], APRN    11/5/18[name] is a 50 y.o. male , seen here as in a referral from Dr. Larose Kells for snoring and witnessed apnea following a medical  procedure under anesthesia.  Chief complaint according to patient : "I am not excessively tired or fatigued during the day, but the reported apnea is worrisome"  Cameron Valenzuela is a Caucasian 50 year old married gentleman who presents following 2 lithotripsies under anesthesia- after each apnea had been noticed by medical staff. The first procedure took place in May,  the second in August 2018. The patient also reports that his body mass index and weight has fluctuated vastly, currently being at a higher end.  Last summer he lost about 35 pounds over a period of only 3 months while working out of state.  This came back with an additional weight gain, now at 240 pounds. Other diagnosis were listed below.   Sleep habits are as follows: The patient usually watches television during the last hour before he retreats to bed.  Bedtime is around 11 PM and he has no trouble to fall asleep.  He does not have a preferred sleep position, sleeps on 2 pillows, in the bedroom that is described as cool, quiet and dark.  He shares a bedroom with his wife. He does recall his dreams occasionally, dreams are not described as threatening or nightmarish in character.  He may have one nocturia break at night but not more. He wakes up spontaneously at about 5:30 AM, and he rises feeling usually refreshed and restored. He wakes with a dry mouth, rarely with headaches in season for his allergic rhinitis.     REVIEW OF SYSTEMS: Full 14 system review of systems performed and notable only for those listed, all others are neg:  Gained further weight Sleep : Obstructive sleep apnea with CPAP.  FSS at 21/ 63 points  Epworth Sleepiness Scale at 6/ 24 points.   unchanged after Covid infection in May 2020.    ALLERGIES: No Known Allergies  HOME MEDICATIONS: Outpatient Medications Prior to Visit  Medication Sig Dispense Refill  . metFORMIN (GLUCOPHAGE) 500 MG tablet Take 1 tablet (500 mg total) by mouth 2 (two) times daily  with a meal. 60 tablet 0  . Vitamin D, Ergocalciferol, (DRISDOL) 1.25 MG (50000 UNIT) CAPS capsule Take 1 capsule (50,000 Units total) by mouth every 7 (seven) days. 4 capsule 0   No facility-administered medications prior to visit.    PAST MEDICAL HISTORY: Past Medical History:  Diagnosis Date  . Allergic rhinitis   . Allergy    seasonal  . Family history of adverse reaction to anesthesia    mother slow to wake up   . Fatigue   . History of kidney stones   . Kidney stones   . Knee pain, right   . Lower extremity edema   . Pinched nerve in neck   . Rosacea   .  SCC (squamous cell carcinoma) 2015-11-09   R face  . 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 Sleep apnea     PAST SURGICAL HISTORY: Past Surgical History:  Procedure Laterality Date  . Cystocopy     Dr. Karsten Ro  . CYSTOSCOPY WITH STENT PLACEMENT Left 02/18/2017   Procedure: CYSTOSCOPY/ LEFT RETROGRADE/ WITH LEFT STENT PLACEMENT;  Surgeon: Kathie Rhodes, MD;  Location: [facility];  Service: Urology;  Laterality: Left;  . EXTRACORPOREAL SHOCK WAVE LITHOTRIPSY Left 02/18/2017   Procedure: LEFT EXTRACORPOREAL SHOCK WAVE LITHOTRIPSY (ESWL);  Surgeon: Franchot Gallo, MD;  Location: [facility];  Service: Urology;  Laterality: Left;  . EXTRACORPOREAL SHOCK WAVE LITHOTRIPSY Left 04/11/2017   Procedure: LEFT EXTRACORPOREAL SHOCK WAVE LITHOTRIPSY (ESWL);  Surgeon: Irine Seal, MD;  Location: [facility];  Service: Urology;  Laterality: Left;  . LITHOTRIPSY     02/18/17  . neck bipsy     beningn, in his 56s    FAMILY HISTORY: Family History  Problem Relation Age of Onset  . Diabetes Father   . COPD Father   . Heart disease Father        ?  . Stroke Father   . Sleep apnea Father   . Obesity Father   . Heart disease Maternal Grandfather   . Heart disease Paternal Grandmother   . Diabetes Other        many fam members   . Hyperlipidemia Mother        M and others  . Cancer Mother   . Colon cancer Neg Hx   .  Prostate cancer Neg Hx     SOCIAL HISTORY: Social History   Socioeconomic History  . Marital status: Married    Spouse name: Vaughan Basta  . Number of children: 1  . Years of education: Not on file  . Highest education level: Not on file  Occupational History  . 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 Smoking status: Never Smoker  . Smokeless tobacco: Never Used  Vaping Use  . Vaping Use: Never used  Substance and Sexual Activity  . Alcohol use: Yes    Comment: rarely  . Drug use: No  . Sexual activity: Yes  Other Topics Concern  . Not on file  Social History Narrative   Household-- pt, wife, child     (mother in law [name], passed away Nov 09, 2015)   Social Determinants of Health   Financial Resource Strain: Not on file  Food Insecurity: Not on file  Transportation Needs: Not on file  Physical Activity: Not on file  Stress: Not on file  Social Connections: Not on file  Intimate Partner Violence: Not on file     PHYSICAL EXAM  Vitals:   07/27/20 0929  BP: 136/72  Pulse: 83  Weight: 234 lb (106.1 kg)  Height: 5\' 10"  (1.778 m)   Body mass index is 33.58 kg/m.  Generalized: Well developed, obese male in no acute distress  Head: normocephalic and atraumatic,. Oropharynx benign mallopatti 4-5 Neck: Supple, neck circumference 19 inches.  Musculoskeletal: No deformity   Neurological examination   Mentation: Alert oriented to time, place, history taking. Attention span and concentration appropriate. Recent and remote memory intact.  Follows all commands speech and language fluent. ESS 4  Cranial nerve :  Taste and smell are intact. He never lost either.    Pupils were equal round reactive to light extraocular movements were full,. Facial sensation and strength were normal.  hearing was intact to finger rubbing bilaterally. Uvula tongue midline. head turning and shoulder shrug were normal and symmetric.Tongue protrusion into cheek strength was  normal. Motor: normal bulk and tone, C 6 impingement.    DIAGNOSTIC DATA (LABS, IMAGING, TESTING)  download CPAP data .  - I reviewed patient records, labs, notes, testing and imaging myself where available.  Lab Results  Component Value Date   WBC 5.4 09/18/2018   HGB 15.1 09/18/2018   HCT 41.5 09/18/2018   MCV 86 09/18/2018   PLT 176.0 06/24/2018      Component Value Date/Time   NA 141 05/10/2020 0932   K 4.4 05/10/2020 0932   CL 103 05/10/2020 0932   CO2 25 05/10/2020 0932   GLUCOSE 91 05/10/2020 0932   GLUCOSE 122 (H) 06/24/2018 0809   BUN 20 05/10/2020 0932   CREATININE 0.93 05/10/2020 0932   CALCIUM 9.2 05/10/2020 0932   PROT 6.7 05/10/2020 0932   ALBUMIN 4.5 05/10/2020 0932   AST 22 05/10/2020 0932   ALT 33 05/10/2020 0932   ALKPHOS 68 05/10/2020 0932   BILITOT 0.4 05/10/2020 0932   GFRNONAA 95 05/10/2020 0932   GFRAA 110 05/10/2020 0932   Lab Results  Component Value Date   CHOL 218 (H) 05/10/2020   HDL 30 (L) 05/10/2020   LDLCALC 127 (H) 05/10/2020   LDLDIRECT 116.0 06/24/2018   TRIG 344 (H) 05/10/2020   CHOLHDL 8 06/24/2018   Lab Results  Component Value Date   HGBA1C 5.4 05/10/2020   Lab Results  Component Value Date   VITAMINB12 559 05/10/2020   Lab Results  Component Value Date   TSH 3.960 09/18/2018      ASSESSMENT AND PLAN  50 y.o. year old male  has a past medical history of Allergic rhinitis, OSA on CPAP, Obesity, and infection to Covid 19 in 11-2018. BMI is now at 33.5. AHI has increased to 4.8/ and he needs a higher pressure setting. Adjust max pressure to 18 cm water.   [name]   1) There are no central apneas emerging so I think we are safe to further increase the pressure unless the patient cannot tolerated. He has not noted air leaks- has facial hair.   I had given an order to increase pressure already in my last visit with the patient, but it was never followed. He uses [employer], DME.  I will increase CPAP upper Pressure to 18 cm  water, 2 cm EPR. No aerophagia reported.   2) he lost 50 pounds at the  medical weight management with Dr. Leafy Ro, MD  3) cervical radiculopathy, consider ergonomic chnages to his workplace.      [facility] [address], [place], [place] [address] [phone]

## 2020-07-27 NOTE — Progress Notes (Signed)
Order for cpap pressure change sent to [facility] via community message. Confirmation received that the order transmitted was successful.

## 2020-07-28 ENCOUNTER — Encounter (INDEPENDENT_AMBULATORY_CARE_PROVIDER_SITE_OTHER): Payer: Self-pay

## 2020-07-28 ENCOUNTER — Other Ambulatory Visit (INDEPENDENT_AMBULATORY_CARE_PROVIDER_SITE_OTHER): Payer: Self-pay | Admitting: Family Medicine

## 2020-07-28 DIAGNOSIS — R7303 Prediabetes: Secondary | ICD-10-CM

## 2020-07-28 NOTE — Telephone Encounter (Signed)
Message sent to [facility] 

## 2020-07-30 ENCOUNTER — Encounter (INDEPENDENT_AMBULATORY_CARE_PROVIDER_SITE_OTHER): Payer: Self-pay

## 2020-08-02 ENCOUNTER — Telehealth (INDEPENDENT_AMBULATORY_CARE_PROVIDER_SITE_OTHER): Payer: Self-pay

## 2020-08-02 ENCOUNTER — Telehealth (INDEPENDENT_AMBULATORY_CARE_PROVIDER_SITE_OTHER): Payer: 59 | Admitting: Family Medicine

## 2020-08-02 ENCOUNTER — Other Ambulatory Visit: Payer: Self-pay

## 2020-08-02 ENCOUNTER — Encounter (INDEPENDENT_AMBULATORY_CARE_PROVIDER_SITE_OTHER): Payer: Self-pay

## 2020-08-02 ENCOUNTER — Encounter (INDEPENDENT_AMBULATORY_CARE_PROVIDER_SITE_OTHER): Payer: Self-pay | Admitting: Family Medicine

## 2020-08-02 DIAGNOSIS — E669 Obesity, unspecified: Secondary | ICD-10-CM | POA: Diagnosis not present

## 2020-08-02 DIAGNOSIS — Z6833 Body mass index (BMI) 33.0-33.9, adult: Secondary | ICD-10-CM

## 2020-08-02 DIAGNOSIS — E559 Vitamin D deficiency, unspecified: Secondary | ICD-10-CM | POA: Diagnosis not present

## 2020-08-02 DIAGNOSIS — R7303 Prediabetes: Secondary | ICD-10-CM | POA: Diagnosis not present

## 2020-08-02 MED ORDER — VITAMIN D (ERGOCALCIFEROL) 1.25 MG (50000 UNIT) PO CAPS: 50000.0000 [IU] | ORAL_CAPSULE | ORAL | 0 refills | Status: DC

## 2020-08-02 MED ORDER — METFORMIN HCL 500 MG PO TABS: 500.0000 mg | ORAL_TABLET | Freq: Two times a day (BID) | ORAL | 0 refills | Status: DC

## 2020-08-02 NOTE — Telephone Encounter (Signed)
I connected with  Cameron Valenzuela on 08/02/20 by a video enabled telemedicine application and verified that I am speaking with the correct person using two identifiers.   I discussed the limitations of evaluation and management by telemedicine. The patient expressed understanding and agreed to proceed.

## 2020-08-04 NOTE — Progress Notes (Signed)
TeleHealth Visit:  Due to the COVID-19 pandemic, this visit was completed with telemedicine (audio/video) technology to reduce patient and provider exposure as well as to preserve personal protective equipment.   Cameron Valenzuela has verbally consented to this TeleHealth visit. The patient is located at home, the provider is located at the [facility] office. The participants in this visit include the listed provider and patient. The visit was conducted today via MyChart Video.    Chief Complaint: OBESITY Cameron Valenzuela is here to discuss his progress with his obesity treatment plan along with follow-up of his obesity related diagnoses. Cameron Valenzuela is on the Category 4 Plan and states he is following his eating plan approximately 65% of the time. Cameron Valenzuela states he is walking 2 times for 30 minutes.  Today's visit was #: 49 Starting weight: 256 lbs Starting date: 09/18/2018  Interim History: Weight reported at 232 lbs today. Wife is on board with more control in terms of food coming into house and the meal plan.Cameron Valenzuela is doing quite a bit of food prep, making significant meals at one time to alleviate stress of cooking daily and decrease eating out.  Subjective:   1.  Vitamin D deficiency  Cameron Valenzuela denies nausea, vomiting, or muscle weakness, but notes fatigue. He is on prescription Vitamin D.  2.   Pre-diabetes Cameron Valenzuela has a diagnosis of prediabetes based on his elevated HgA1c and was informed this puts him at greater risk of developing diabetes. He continues to work on diet and exercise to decrease his risk of diabetes. He denies nausea or hypoglycemia. Last A1c was 5.4 and insulin 25.0. Cameron Valenzuela is on metformin and denies GI side effects.  Lab Results  Component Value Date   HGBA1C 5.4 05/10/2020   Lab Results  Component Value Date   INSULIN 25.0 (H) 05/10/2020   INSULIN 18.7 12/24/2019   INSULIN 22.7 07/27/2019   INSULIN 16.4 02/11/2019   INSULIN 60.2 (H) 09/18/2018     Assessment/Plan:  1. Vitamin D deficiency Low Vitamin D level contributes to fatigue and are associated with obesity, breast, and colon cancer. He agrees to continue to take prescription Vitamin D @50 ,000 IU every week and will follow-up for routine testing of Vitamin D, at least 2-3 times per year to avoid over-replacement. We will refill Vitamin D 50,000 Unit weekly #4 no refill.  - Vitamin D, Ergocalciferol, (DRISDOL) 1.25 MG (50000 UNIT) CAPS capsule; Take 1 capsule (50,000 Units total) by mouth every 7 (seven) days.  Dispense: 4 capsule; Refill: 0  2. Pre-diabetes Cameron Valenzuela will continue to work on weight loss, exercise, and decreasing simple carbohydrates to help decrease the risk of diabetes. We will refill metformin 500 mg po BID #60. No refill.  - metFORMIN (GLUCOPHAGE) 500 MG tablet; Take 1 tablet (500 mg total) by mouth 2 (two) times daily with a meal.  Dispense: 60 tablet; Refill: 0  3. Class 1 obesity with serious comorbidity and body mass index (BMI) of 33.0 to 33.9 in adult, unspecified obesity type Cameron Valenzuela is currently in the action stage of change. As such, his goal is to continue with weight loss efforts. He has agreed to the Category 4 Plan and keeping a food journal and adhering to recommended goals of 550-700 calories and 50+ grams of protein at supper.   Exercise goals: No exercise has been prescribed at this time.  Behavioral modification strategies: decreasing eating out, meal planning and cooking strategies, better snacking choices and planning for success.  Cameron Valenzuela has agreed to  follow-up with our clinic in 4 weeks on 08/30/2020 at 8:40 am. He was informed of the importance of frequent follow-up visits to maximize his success with intensive lifestyle modifications for his multiple health conditions.   Objective:   VITALS: Per patient if applicable, see vitals. GENERAL: Alert and in no acute distress. CARDIOPULMONARY: No increased WOB. Speaking in clear  sentences.  PSYCH: Pleasant and cooperative. Speech normal rate and rhythm. Affect is appropriate. Insight and judgement are appropriate. Attention is focused, linear, and appropriate.  NEURO: Oriented as arrived to appointment on time with no prompting.   Lab Results  Component Value Date   CREATININE 0.93 05/10/2020   BUN 20 05/10/2020   NA 141 05/10/2020   K 4.4 05/10/2020   CL 103 05/10/2020   CO2 25 05/10/2020   Lab Results  Component Value Date   ALT 33 05/10/2020   AST 22 05/10/2020   ALKPHOS 68 05/10/2020   BILITOT 0.4 05/10/2020   Lab Results  Component Value Date   HGBA1C 5.4 05/10/2020   HGBA1C 5.2 12/24/2019   HGBA1C 5.1 07/27/2019   HGBA1C 5.0 02/11/2019   HGBA1C 6.2 (H) 09/18/2018   Lab Results  Component Value Date   INSULIN 25.0 (H) 05/10/2020   INSULIN 18.7 12/24/2019   INSULIN 22.7 07/27/2019   INSULIN 16.4 02/11/2019   INSULIN 60.2 (H) 09/18/2018   Lab Results  Component Value Date   TSH 3.960 09/18/2018   Lab Results  Component Value Date   CHOL 218 (H) 05/10/2020   HDL 30 (L) 05/10/2020   LDLCALC 127 (H) 05/10/2020   LDLDIRECT 116.0 06/24/2018   TRIG 344 (H) 05/10/2020   CHOLHDL 8 06/24/2018   Lab Results  Component Value Date   WBC 5.4 09/18/2018   HGB 15.1 09/18/2018   HCT 41.5 09/18/2018   MCV 86 09/18/2018   PLT 176.0 06/24/2018   No results found for: IRON, TIBC, FERRITIN  Attestation Statements:   Reviewed by clinician on day of visit: allergies, medications, problem list, medical history, surgical history, family history, social history, and previous encounter notes.    I, Para March, am acting as transcriptionist for Coralie Common, MD. I have reviewed the above documentation for accuracy and completeness, and I agree with the above. - Jinny Blossom, MD

## 2020-08-12 ENCOUNTER — Other Ambulatory Visit: Payer: Self-pay

## 2020-08-12 ENCOUNTER — Ambulatory Visit (INDEPENDENT_AMBULATORY_CARE_PROVIDER_SITE_OTHER): Payer: 59 | Admitting: Internal Medicine

## 2020-08-12 ENCOUNTER — Encounter: Payer: Self-pay | Admitting: Internal Medicine

## 2020-08-12 VITALS — BP 127/74 | HR 84 | Temp 98.1°F | Ht 70.0 in | Wt 238.0 lb

## 2020-08-12 DIAGNOSIS — E038 Other specified hypothyroidism: Secondary | ICD-10-CM | POA: Diagnosis not present

## 2020-08-12 DIAGNOSIS — Z0000 Encounter for general adult medical examination without abnormal findings: Secondary | ICD-10-CM

## 2020-08-12 DIAGNOSIS — Z1211 Encounter for screening for malignant neoplasm of colon: Secondary | ICD-10-CM

## 2020-08-12 LAB — CBC WITH DIFFERENTIAL/PLATELET
Basophils Absolute: 0 10*3/uL (ref 0.0–0.1)
Basophils Relative: 0.3 % (ref 0.0–3.0)
Eosinophils Absolute: 0.2 10*3/uL (ref 0.0–0.7)
Eosinophils Relative: 3 % (ref 0.0–5.0)
HCT: 42.5 % (ref 39.0–52.0)
Hemoglobin: 14.3 g/dL (ref 13.0–17.0)
Lymphocytes Relative: 34 % (ref 12.0–46.0)
Lymphs Abs: 1.8 10*3/uL (ref 0.7–4.0)
MCHC: 33.6 g/dL (ref 30.0–36.0)
MCV: 91.3 fl (ref 78.0–100.0)
Monocytes Absolute: 0.5 10*3/uL (ref 0.1–1.0)
Monocytes Relative: 10 % (ref 3.0–12.0)
Neutro Abs: 2.8 10*3/uL (ref 1.4–7.7)
Neutrophils Relative %: 52.7 % (ref 43.0–77.0)
Platelets: 165 10*3/uL (ref 150.0–400.0)
RBC: 4.65 Mil/uL (ref 4.22–5.81)
RDW: 14.3 % (ref 11.5–15.5)
WBC: 5.3 10*3/uL (ref 4.0–10.5)

## 2020-08-12 LAB — URINALYSIS, ROUTINE W REFLEX MICROSCOPIC
Bilirubin Urine: NEGATIVE
Ketones, ur: NEGATIVE
Leukocytes,Ua: NEGATIVE
Nitrite: NEGATIVE
Specific Gravity, Urine: 1.02 (ref 1.000–1.030)
Total Protein, Urine: NEGATIVE
Urine Glucose: NEGATIVE
Urobilinogen, UA: 0.2 (ref 0.0–1.0)
pH: 5.5 (ref 5.0–8.0)

## 2020-08-12 LAB — TSH: TSH: 5.19 u[IU]/mL — ABNORMAL HIGH (ref 0.35–4.50)

## 2020-08-12 LAB — PSA: PSA: 0.49 ng/mL (ref 0.10–4.00)

## 2020-08-12 NOTE — Progress Notes (Signed)
Subjective:    Patient ID: Cameron Valenzuela, male    DOB: 10-01-1970, 50 y.o.   MRN: [id number]  DOS:  08/12/2020 Type of visit - description: CPX In general feels well. He is follow-up by the wellness clinic, was able to lost a total of 55 pounds but has gained half of it back.  History of kidney stones, from time to time he has a ache at the left flank and left abdomen, it last few hours. No associated with fever, chills, nausea, no LUTS.  No blood in the stools.  Symptoms are mild in nature.   Review of Systems  Other than above, a 14 point review of systems is negative     Past Medical History:  Diagnosis Date  . Allergic rhinitis   . Allergy    seasonal  . Family history of adverse reaction to anesthesia    mother slow to wake up   . Fatigue   . History of kidney stones   . Kidney stones   . Knee pain, right   . Lower extremity edema   . Pinched nerve in neck   . Rosacea   . 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 Sleep apnea     Past Surgical History:  Procedure Laterality Date  . Cystocopy     Dr. Karsten Ro  . CYSTOSCOPY WITH STENT PLACEMENT Left 02/18/2017   Procedure: CYSTOSCOPY/ LEFT RETROGRADE/ WITH LEFT STENT PLACEMENT;  Surgeon: Kathie Rhodes, MD;  Location: [facility];  Service: Urology;  Laterality: Left;  . EXTRACORPOREAL SHOCK WAVE LITHOTRIPSY Left 02/18/2017   Procedure: LEFT EXTRACORPOREAL SHOCK WAVE LITHOTRIPSY (ESWL);  Surgeon: Franchot Gallo, MD;  Location: [facility];  Service: Urology;  Laterality: Left;  . EXTRACORPOREAL SHOCK WAVE LITHOTRIPSY Left 04/11/2017   Procedure: LEFT EXTRACORPOREAL SHOCK WAVE LITHOTRIPSY (ESWL);  Surgeon: Irine Seal, MD;  Location: [facility];  Service: Urology;  Laterality: Left;  . LITHOTRIPSY     02/18/17  . neck bipsy     beningn, in his 26s    Allergies as of 08/12/2020   No Known Allergies     Medication List       Accurate as of August 12, 2020 11:59 PM. If you have any questions, ask your nurse or doctor.        metFORMIN 500 MG tablet Commonly known as: GLUCOPHAGE Take 1 tablet (500 mg total) by mouth 2 (two) times daily with a meal.   Vitamin D (Ergocalciferol) 1.25 MG (50000 UNIT) Caps capsule Commonly known as: DRISDOL Take 1 capsule (50,000 Units total) by mouth every 7 (seven) days.          Objective:   Physical Exam BP 127/74 (BP Location: Right Arm, Patient Position: Sitting, Cuff Size: Large)   Pulse 84   Temp 98.1 F (36.7 C) (Oral)   Ht 5\' 10"  (1.778 m)   Wt 238 lb (108 kg)   SpO2 98%   BMI 34.15 kg/m   General: Well developed, NAD, BMI noted Neck: No  thyromegaly  HEENT:  Normocephalic . Face symmetric, atraumatic Lungs:  CTA B Normal respiratory effort, no intercostal retractions, no accessory muscle use. Heart: RRR,  no murmur.  Abdomen:  Not distended, soft, non-tender. No rebound or rigidity. DRE: Normal sphincter tone, no stool, normal prostate Lower extremities: no pretibial edema bilaterally  Skin: Exposed areas without rash. Not pale. Not jaundice Neurologic:  alert & oriented X3.  Speech normal, gait appropriate for age and unassisted Strength symmetric and appropriate for age.  Psych: Cognition and judgment appear intact.  Cooperative with normal attention span and concentration.  Behavior appropriate. No anxious or depressed appearing.     Assessment     Assessment Pre diabetes Seasonal allergies Rosacea Kidney stones ([facility]) OSA per sleep 2018 , has a CPAP  SCC dx 2019 Dx Covid 11/2018  PLAN: Here for CPX Prediabetes: Per wellness clinic Obesity: Follow-up of the wellness clinic.  Was able to lost 55 pounds at some point but he gained half of it back, planning to go back to a healthier lifestyle. Kidney stones, h/o: Has occasional left-sided abdominal pain, see HPI.  Etiology unclear, will check a UA, if sudden, severe symptoms go to the ER.   OSA:  Good CPAP compliance Dermatology: Recommend to see them regularly RTC 1 year   This visit occurred during the SARS-CoV-2 public health emergency.  Safety protocols were in place, including screening questions prior to the visit, additional usage of staff PPE, and extensive cleaning of exam room while observing appropriate contact time as indicated for disinfecting solutions.

## 2020-08-12 NOTE — Patient Instructions (Signed)
Please call the gastroenterology office to set up a colonoscopy at your convenience. Number: [phone]     GO TO THE LAB : Get the blood work     [place], [place] back for a physical exam in 1 year

## 2020-08-13 ENCOUNTER — Encounter: Payer: Self-pay | Admitting: Internal Medicine

## 2020-08-13 NOTE — Assessment & Plan Note (Signed)
Here for CPX Prediabetes: Per wellness clinic Obesity: Follow-up of the wellness clinic.  Was able to lost 55 pounds at some point but he gained half of it back, planning to go back to a healthier lifestyle. Kidney stones, h/o: Has occasional left-sided abdominal pain, see HPI.  Etiology unclear, will check a UA, if sudden, severe symptoms go to the ER.   OSA: Good CPAP compliance Dermatology: Recommend to see them regularly RTC 1 year

## 2020-08-13 NOTE — Assessment & Plan Note (Signed)
-  Td 2016 -COVID VAX x3  -Had a flu shot -CCS:  no previous colonoscopy, no FH, 3 options reviewed, we agreed on Cscope , referral sent  -Prostate cancer screening: DRE normal, check a PSA -Diet-exercise: Discussed  -Labs done at the wellness clinic, will get: TSH, CBC, PSA, hep C, UA

## 2020-08-15 LAB — HEPATITIS C ANTIBODY
Hepatitis C Ab: NONREACTIVE
SIGNAL TO CUT-OFF: 0.01 (ref <1.00)

## 2020-08-17 NOTE — Addendum Note (Signed)
Addended byDamita Dunnings D on: 08/17/2020 12:59 PM   Modules accepted: Orders

## 2020-08-30 ENCOUNTER — Telehealth (INDEPENDENT_AMBULATORY_CARE_PROVIDER_SITE_OTHER): Payer: 59 | Admitting: Family Medicine

## 2020-08-30 ENCOUNTER — Encounter (INDEPENDENT_AMBULATORY_CARE_PROVIDER_SITE_OTHER): Payer: Self-pay | Admitting: Family Medicine

## 2020-08-30 ENCOUNTER — Other Ambulatory Visit: Payer: Self-pay

## 2020-08-30 DIAGNOSIS — E669 Obesity, unspecified: Secondary | ICD-10-CM

## 2020-08-30 DIAGNOSIS — F3289 Other specified depressive episodes: Secondary | ICD-10-CM

## 2020-08-30 DIAGNOSIS — R7303 Prediabetes: Secondary | ICD-10-CM | POA: Diagnosis not present

## 2020-08-30 DIAGNOSIS — E559 Vitamin D deficiency, unspecified: Secondary | ICD-10-CM

## 2020-08-30 DIAGNOSIS — E7849 Other hyperlipidemia: Secondary | ICD-10-CM

## 2020-08-30 DIAGNOSIS — Z6833 Body mass index (BMI) 33.0-33.9, adult: Secondary | ICD-10-CM

## 2020-08-30 MED ORDER — VITAMIN D (ERGOCALCIFEROL) 1.25 MG (50000 UNIT) PO CAPS: 50000.0000 [IU] | ORAL_CAPSULE | ORAL | 0 refills | Status: DC

## 2020-08-30 MED ORDER — METFORMIN HCL 500 MG PO TABS: 500.0000 mg | ORAL_TABLET | Freq: Two times a day (BID) | ORAL | 0 refills | Status: DC

## 2020-08-30 MED ORDER — FENOFIBRIC ACID 45 MG PO CPDR: 45.0000 mg | DELAYED_RELEASE_CAPSULE | Freq: Every day | ORAL | 0 refills | Status: DC

## 2020-08-30 MED ORDER — BUPROPION HCL ER (SR) 150 MG PO TB12: 150.0000 mg | ORAL_TABLET | Freq: Every day | ORAL | 0 refills | Status: DC

## 2020-09-05 ENCOUNTER — Other Ambulatory Visit (INDEPENDENT_AMBULATORY_CARE_PROVIDER_SITE_OTHER): Payer: Self-pay | Admitting: Family Medicine

## 2020-09-05 DIAGNOSIS — R7303 Prediabetes: Secondary | ICD-10-CM

## 2020-09-06 NOTE — Progress Notes (Signed)
TeleHealth Visit:  Due to the COVID-19 pandemic, this visit was completed with telemedicine (audio/video) technology to reduce patient and provider exposure as well as to preserve personal protective equipment.   Cameron Valenzuela has verbally consented to this TeleHealth visit. The patient is located at home, the provider is located at the [facility] office. The participants in this visit include the listed provider and patient. The visit was conducted today via video.   Chief Complaint: OBESITY Cameron Valenzuela is here to discuss his progress with his obesity treatment plan along with follow-up of his obesity related diagnoses. Cameron Valenzuela is on the Category 4 Plan and states he is following his eating plan approximately 60% of the time. Cameron Valenzuela states he is walking 10,000 steps 5 times per week.  Today's visit was #: 35 Starting weight: 256 lbs Starting date: 09/18/2018  Interim History: Cameron Valenzuela had a bit of a head/chest cold for a bit. He does think he's still eating out a bit too much. He is eating out more than cooking at home. He is going on a cruise Feb 19 and leaving in 1 week. He would like to eat more on plan prior to leaving. He has food in the house for meal plan.   Subjective:   1. Pre-diabetes Cameron Valenzuela is on Metformin with no GI side effects. His last A1c was 5.4 with an insulin level of 25.0.  Lab Results  Component Value Date   HGBA1C 5.4 05/10/2020   Lab Results  Component Value Date   INSULIN 25.0 (H) 05/10/2020   INSULIN 18.7 12/24/2019   INSULIN 22.7 07/27/2019   INSULIN 16.4 02/11/2019   INSULIN 60.2 (H) 09/18/2018    2. Vitamin D deficiency Pt denies nausea, vomiting, and muscle weakness but notes fatigue. Pt is on prescription Vit D. His last Vit D level was 38.5.  3. Other hyperlipidemia Cameron Valenzuela had elevated triglycerides (family history of elevation). His LDL 127.  Lab Results  Component Value Date   ALT 33 05/10/2020   AST 22 05/10/2020   ALKPHOS 68  05/10/2020   BILITOT 0.4 05/10/2020   Lab Results  Component Value Date   CHOL 218 (H) 05/10/2020   HDL 30 (L) 05/10/2020   LDLCALC 127 (H) 05/10/2020   LDLDIRECT 116.0 06/24/2018   TRIG 344 (H) 05/10/2020   CHOLHDL 8 06/24/2018    4. Other depression Pt denies suicidal or homicidal ideations. Cameron Valenzuela was previously on Wellbutrin with improvement of emotional eating and snacking.  Assessment/Plan:   1. Pre-diabetes Cameron Valenzuela will continue to work on weight loss, exercise, and decreasing simple carbohydrates to help decrease the risk of diabetes.   - metFORMIN (GLUCOPHAGE) 500 MG tablet; Take 1 tablet (500 mg total) by mouth 2 (two) times daily with a meal.  Dispense: 60 tablet; Refill: 0  2. Vitamin D deficiency Low Vitamin D level contributes to fatigue and are associated with obesity, breast, and colon cancer. He agrees to continue to take prescription Vitamin D @50 ,000 IU every week and will follow-up for routine testing of Vitamin D, at least 2-3 times per year to avoid over-replacement.  - Vitamin D, Ergocalciferol, (DRISDOL) 1.25 MG (50000 UNIT) CAPS capsule; Take 1 capsule (50,000 Units total) by mouth every 7 (seven) days.  Dispense: 4 capsule; Refill: 0  3. Other hyperlipidemia Cardiovascular risk and specific lipid/LDL goals reviewed.  We discussed several lifestyle modifications today and Cameron Valenzuela will continue to work on diet, exercise and weight loss efforts. Orders and follow up as documented  in patient record. Start Fenofibrate 45 mg, as per below.  Counseling Intensive lifestyle modifications are the first line treatment for this issue. . Dietary changes: Increase soluble fiber. Decrease simple carbohydrates. . Exercise changes: Moderate to vigorous-intensity aerobic activity 150 minutes per week if tolerated. . Lipid-lowering medications: see documented in medical record.  - Choline Fenofibrate (FENOFIBRIC ACID) 45 MG CPDR; Take 45 mg by mouth daily.  Dispense: 60  capsule; Refill: 0  4. Other depression Behavior modification techniques were discussed today to help Cameron Valenzuela deal with his emotional/non-hunger eating behaviors.  Orders and follow up as documented in patient record. Restart Wellbutrin 150 mg, as per below.  - buPROPion (WELLBUTRIN SR) 150 MG 12 hr tablet; Take 1 tablet (150 mg total) by mouth daily.  Dispense: 30 tablet; Refill: 0  5. Class 1 obesity with serious comorbidity and body mass index (BMI) of 33.0 to 33.9 in adult, unspecified obesity type Cameron Valenzuela is currently in the action stage of change. As such, his goal is to continue with weight loss efforts. He has agreed to the Category 4 Plan.   Exercise goals: All adults should avoid inactivity. Some physical activity is better than none, and adults who participate in any amount of physical activity gain some health benefits.  Behavioral modification strategies: increasing lean protein intake, meal planning and cooking strategies, keeping healthy foods in the home and planning for success.  Cameron Valenzuela has agreed to follow-up with our clinic in 8 weeks. He was informed of the importance of frequent follow-up visits to maximize his success with intensive lifestyle modifications for his multiple health conditions.  Objective:   VITALS: Per patient if applicable, see vitals. GENERAL: Alert and in no acute distress. CARDIOPULMONARY: No increased WOB. Speaking in clear sentences.  PSYCH: Pleasant and cooperative. Speech normal rate and rhythm. Affect is appropriate. Insight and judgement are appropriate. Attention is focused, linear, and appropriate.  NEURO: Oriented as arrived to appointment on time with no prompting.   Lab Results  Component Value Date   CREATININE 0.93 05/10/2020   BUN 20 05/10/2020   NA 141 05/10/2020   K 4.4 05/10/2020   CL 103 05/10/2020   CO2 25 05/10/2020   Lab Results  Component Value Date   ALT 33 05/10/2020   AST 22 05/10/2020   ALKPHOS 68 05/10/2020    BILITOT 0.4 05/10/2020   Lab Results  Component Value Date   HGBA1C 5.4 05/10/2020   HGBA1C 5.2 12/24/2019   HGBA1C 5.1 07/27/2019   HGBA1C 5.0 02/11/2019   HGBA1C 6.2 (H) 09/18/2018   Lab Results  Component Value Date   INSULIN 25.0 (H) 05/10/2020   INSULIN 18.7 12/24/2019   INSULIN 22.7 07/27/2019   INSULIN 16.4 02/11/2019   INSULIN 60.2 (H) 09/18/2018   Lab Results  Component Value Date   TSH 5.19 (H) 08/12/2020   Lab Results  Component Value Date   CHOL 218 (H) 05/10/2020   HDL 30 (L) 05/10/2020   LDLCALC 127 (H) 05/10/2020   LDLDIRECT 116.0 06/24/2018   TRIG 344 (H) 05/10/2020   CHOLHDL 8 06/24/2018   Lab Results  Component Value Date   WBC 5.3 08/12/2020   HGB 14.3 08/12/2020   HCT 42.5 08/12/2020   MCV 91.3 08/12/2020   PLT 165.0 08/12/2020   No results found for: IRON, TIBC, FERRITIN  Attestation Statements:   Reviewed by clinician on day of visit: allergies, medications, problem list, medical history, surgical history, family history, social history, and previous encounter notes.  Time spent on visit including pre-visit chart review and post-visit charting and care was 20 minutes.   Coral Ceo, am acting as transcriptionist for Coralie Common, MD.   I have reviewed the above documentation for accuracy and completeness, and I agree with the above. - Jinny Blossom, MD

## 2020-09-21 ENCOUNTER — Other Ambulatory Visit (INDEPENDENT_AMBULATORY_CARE_PROVIDER_SITE_OTHER): Payer: Self-pay | Admitting: Family Medicine

## 2020-09-21 ENCOUNTER — Encounter (INDEPENDENT_AMBULATORY_CARE_PROVIDER_SITE_OTHER): Payer: Self-pay

## 2020-09-21 DIAGNOSIS — F3289 Other specified depressive episodes: Secondary | ICD-10-CM

## 2020-09-21 DIAGNOSIS — E7849 Other hyperlipidemia: Secondary | ICD-10-CM

## 2020-09-21 NOTE — Telephone Encounter (Signed)
Last seen by Dr. Ukleja. 

## 2020-09-21 NOTE — Telephone Encounter (Signed)
Message sent to [facility] 

## 2020-09-26 ENCOUNTER — Other Ambulatory Visit (INDEPENDENT_AMBULATORY_CARE_PROVIDER_SITE_OTHER): Payer: Self-pay

## 2020-09-26 ENCOUNTER — Telehealth (INDEPENDENT_AMBULATORY_CARE_PROVIDER_SITE_OTHER): Payer: Self-pay | Admitting: Family Medicine

## 2020-09-26 ENCOUNTER — Encounter (INDEPENDENT_AMBULATORY_CARE_PROVIDER_SITE_OTHER): Payer: Self-pay

## 2020-09-26 DIAGNOSIS — E559 Vitamin D deficiency, unspecified: Secondary | ICD-10-CM

## 2020-09-26 DIAGNOSIS — F3289 Other specified depressive episodes: Secondary | ICD-10-CM

## 2020-09-26 DIAGNOSIS — E7849 Other hyperlipidemia: Secondary | ICD-10-CM

## 2020-09-26 MED ORDER — FENOFIBRIC ACID 45 MG PO CPDR: 45.0000 mg | DELAYED_RELEASE_CAPSULE | Freq: Every day | ORAL | 0 refills | Status: DC

## 2020-09-26 MED ORDER — VITAMIN D (ERGOCALCIFEROL) 1.25 MG (50000 UNIT) PO CAPS: 50000.0000 [IU] | ORAL_CAPSULE | ORAL | 0 refills | Status: DC

## 2020-09-26 MED ORDER — BUPROPION HCL ER (SR) 150 MG PO TB12: 150.0000 mg | ORAL_TABLET | Freq: Every day | ORAL | 0 refills | Status: DC

## 2020-09-26 NOTE — Telephone Encounter (Signed)
Prescriptions sent to the [facility]

## 2020-09-26 NOTE — Telephone Encounter (Signed)
Patient called to get refills for Vit D, Wellbutrin, and the medication for his triglycerides.  Patient states he will be out of these by Friday.

## 2020-10-10 ENCOUNTER — Encounter (INDEPENDENT_AMBULATORY_CARE_PROVIDER_SITE_OTHER): Payer: Self-pay

## 2020-10-10 ENCOUNTER — Other Ambulatory Visit (INDEPENDENT_AMBULATORY_CARE_PROVIDER_SITE_OTHER): Payer: Self-pay | Admitting: Family Medicine

## 2020-10-10 DIAGNOSIS — R7303 Prediabetes: Secondary | ICD-10-CM

## 2020-10-10 NOTE — Telephone Encounter (Signed)
Message sent to [facility] 

## 2020-10-17 ENCOUNTER — Ambulatory Visit (AMBULATORY_SURGERY_CENTER): Payer: Self-pay | Admitting: *Deleted

## 2020-10-17 ENCOUNTER — Other Ambulatory Visit: Payer: Self-pay

## 2020-10-17 VITALS — Ht 70.0 in | Wt 248.0 lb

## 2020-10-17 DIAGNOSIS — Z1211 Encounter for screening for malignant neoplasm of colon: Secondary | ICD-10-CM

## 2020-10-17 MED ORDER — PEG-KCL-NACL-NASULF-NA ASC-C 100 G PO SOLR: 1.0000 | Freq: Once | ORAL | 0 refills | Status: AC

## 2020-10-24 ENCOUNTER — Other Ambulatory Visit (INDEPENDENT_AMBULATORY_CARE_PROVIDER_SITE_OTHER): Payer: Self-pay | Admitting: Family Medicine

## 2020-10-24 DIAGNOSIS — E7849 Other hyperlipidemia: Secondary | ICD-10-CM

## 2020-10-24 NOTE — Telephone Encounter (Signed)
Dr.Ukleja 

## 2020-10-25 ENCOUNTER — Ambulatory Visit (INDEPENDENT_AMBULATORY_CARE_PROVIDER_SITE_OTHER): Payer: 59 | Admitting: Family Medicine

## 2020-10-25 ENCOUNTER — Encounter (INDEPENDENT_AMBULATORY_CARE_PROVIDER_SITE_OTHER): Payer: Self-pay | Admitting: Family Medicine

## 2020-10-25 ENCOUNTER — Other Ambulatory Visit: Payer: Self-pay

## 2020-10-25 ENCOUNTER — Encounter: Payer: Self-pay | Admitting: Gastroenterology

## 2020-10-25 VITALS — BP 105/67 | HR 74 | Temp 97.7°F | Ht 70.0 in | Wt 239.0 lb

## 2020-10-25 DIAGNOSIS — F3289 Other specified depressive episodes: Secondary | ICD-10-CM | POA: Diagnosis not present

## 2020-10-25 DIAGNOSIS — E559 Vitamin D deficiency, unspecified: Secondary | ICD-10-CM

## 2020-10-25 DIAGNOSIS — Z6841 Body Mass Index (BMI) 40.0 and over, adult: Secondary | ICD-10-CM

## 2020-10-25 DIAGNOSIS — E7849 Other hyperlipidemia: Secondary | ICD-10-CM

## 2020-10-25 DIAGNOSIS — Z9189 Other specified personal risk factors, not elsewhere classified: Secondary | ICD-10-CM

## 2020-10-25 DIAGNOSIS — R7303 Prediabetes: Secondary | ICD-10-CM | POA: Diagnosis not present

## 2020-10-25 MED ORDER — BUPROPION HCL ER (SR) 150 MG PO TB12: 150.0000 mg | ORAL_TABLET | Freq: Every day | ORAL | 0 refills | Status: DC

## 2020-10-25 MED ORDER — METFORMIN HCL 500 MG PO TABS: 500.0000 mg | ORAL_TABLET | Freq: Two times a day (BID) | ORAL | 0 refills | Status: DC

## 2020-10-25 MED ORDER — VITAMIN D (ERGOCALCIFEROL) 1.25 MG (50000 UNIT) PO CAPS: 50000.0000 [IU] | ORAL_CAPSULE | ORAL | 0 refills | Status: DC

## 2020-10-25 MED ORDER — FENOFIBRIC ACID 45 MG PO CPDR: 45.0000 mg | DELAYED_RELEASE_CAPSULE | Freq: Every day | ORAL | 0 refills | Status: DC

## 2020-10-28 NOTE — Progress Notes (Signed)
Chief Complaint:   OBESITY Cameron Valenzuela is here to discuss his progress with his obesity treatment plan along with follow-up of his obesity related diagnoses. Cameron Valenzuela is on the Category 4 Plan and states he is following his eating plan approximately 50% of the time. Cameron Valenzuela states he is walking 15-30 minutes 3 times per week.  Today's visit was #: 28 Starting weight: 256 lbs Starting date: 09/18/2018 Today's weight: 239 lbs Today's date: 10/25/2020 Total lbs lost to date: 17 lbs Total lbs lost since last in-office visit: 0  Interim History: Cameron Valenzuela went on a 2 week long vacation over the last month and a half. He voices too much food and too little exercise over that time away. He mentions he and his family are eating out way too frequently. He thinks he may need to get into meal prep.  Subjective:   1. Pre-diabetes Cameron Valenzuela's recent A1c 5.4 and insulin level 25.0. He is on Metformin BID with no GI upset, even when eating indulgently.  2. Vitamin D deficiency Cameron Valenzuela denies nausea, vomiting, and muscle weakness but notes fatigue. Pt is on prescription Vit D. His Vit D level was 38.5 in October 2021.  3. Other depression Cameron Valenzuela symptoms are better controlled with Wellbutrin. He denies suicidal or homicidal ideations.  4. Other hyperlipidemia Cameron Valenzuela is on fenofibric acid. His last LDL 127, HDL 30, and triglycerides 344.   5. At risk for diabetes mellitus Cameron Valenzuela is at higher than average risk for developing diabetes due to obesity.   Assessment/Plan:   1. Pre-diabetes Cameron Valenzuela will continue to work on weight loss, exercise, and decreasing simple carbohydrates to help decrease the risk of diabetes.   - metFORMIN (GLUCOPHAGE) 500 MG tablet; Take 1 tablet (500 mg total) by mouth 2 (two) times daily with a meal.  Dispense: 60 tablet; Refill: 0  2. Vitamin D deficiency Low Vitamin D level contributes to fatigue and are associated with obesity, breast, and colon cancer. He agrees to  continue to take prescription Vitamin D @50 ,000 IU every week and will follow-up for routine testing of Vitamin D, at least 2-3 times per year to avoid over-replacement.  - Vitamin D, Ergocalciferol, (DRISDOL) 1.25 MG (50000 UNIT) CAPS capsule; Take 1 capsule (50,000 Units total) by mouth every 7 (seven) days.  Dispense: 4 capsule; Refill: 0  3. Other depression Behavior modification techniques were discussed today to help Cameron Valenzuela deal with his emotional/non-hunger eating behaviors.  Orders and follow up as documented in patient record.   - buPROPion (WELLBUTRIN SR) 150 MG 12 hr tablet; Take 1 tablet (150 mg total) by mouth daily.  Dispense: 30 tablet; Refill: 0  4. Other hyperlipidemia Cardiovascular risk and specific lipid/LDL goals reviewed.  We discussed several lifestyle modifications today and Cameron Valenzuela will continue to work on diet, exercise and weight loss efforts. Orders and follow up as documented in patient record.   Counseling Intensive lifestyle modifications are the first line treatment for this issue. . Dietary changes: Increase soluble fiber. Decrease simple carbohydrates. . Exercise changes: Moderate to vigorous-intensity aerobic activity 150 minutes per week if tolerated. . Lipid-lowering medications: see documented in medical record.  - Choline Fenofibrate (FENOFIBRIC ACID) 45 MG CPDR; Take 45 mg by mouth daily.  Dispense: 30 capsule; Refill: 0  5. At risk for diabetes mellitus Cameron Valenzuela was given approximately 15 minutes of diabetes education and counseling today. We discussed intensive lifestyle modifications today with an emphasis on weight loss as well as increasing exercise and decreasing simple carbohydrates  in his diet. We also reviewed medication options with an emphasis on risk versus benefit of those discussed.   Repetitive spaced learning was employed today to elicit superior memory formation and behavioral change.  6. Class 3 severe obesity with serious comorbidity  and body mass index (BMI) of 40.0 to 44.9 in adult, unspecified obesity type (HCC) Cameron Valenzuela is currently in the action stage of change. As such, his goal is to continue with weight loss efforts. He has agreed to the Category 4 Plan.   Exercise goals: All adults should avoid inactivity. Some physical activity is better than none, and adults who participate in any amount of physical activity gain some health benefits.  Behavioral modification strategies: increasing lean protein intake, meal planning and cooking strategies and keeping healthy foods in the home.  Cameron Valenzuela has agreed to follow-up with our clinic in 3 weeks. He was informed of the importance of frequent follow-up visits to maximize his success with intensive lifestyle modifications for his multiple health conditions.   Objective:   Blood pressure 105/67, pulse 74, temperature 97.7 F (36.5 C), height 5\' 10"  (1.778 m), weight 239 lb (108.4 kg), SpO2 99 %. Body mass index is 34.29 kg/m.  General: Cooperative, alert, well developed, in no acute distress. HEENT: Conjunctivae and lids unremarkable. Cardiovascular: Regular rhythm.  Lungs: Normal work of breathing. Neurologic: No focal deficits.   Lab Results  Component Value Date   CREATININE 0.93 05/10/2020   BUN 20 05/10/2020   NA 141 05/10/2020   K 4.4 05/10/2020   CL 103 05/10/2020   CO2 25 05/10/2020   Lab Results  Component Value Date   ALT 33 05/10/2020   AST 22 05/10/2020   ALKPHOS 68 05/10/2020   BILITOT 0.4 05/10/2020   Lab Results  Component Value Date   HGBA1C 5.4 05/10/2020   HGBA1C 5.2 12/24/2019   HGBA1C 5.1 07/27/2019   HGBA1C 5.0 02/11/2019   HGBA1C 6.2 (H) 09/18/2018   Lab Results  Component Value Date   INSULIN 25.0 (H) 05/10/2020   INSULIN 18.7 12/24/2019   INSULIN 22.7 07/27/2019   INSULIN 16.4 02/11/2019   INSULIN 60.2 (H) 09/18/2018   Lab Results  Component Value Date   TSH 5.19 (H) 08/12/2020   Lab Results  Component Value Date    CHOL 218 (H) 05/10/2020   HDL 30 (L) 05/10/2020   LDLCALC 127 (H) 05/10/2020   LDLDIRECT 116.0 06/24/2018   TRIG 344 (H) 05/10/2020   CHOLHDL 8 06/24/2018   Lab Results  Component Value Date   WBC 5.3 08/12/2020   HGB 14.3 08/12/2020   HCT 42.5 08/12/2020   MCV 91.3 08/12/2020   PLT 165.0 08/12/2020    Attestation Statements:   Reviewed by clinician on day of visit: allergies, medications, problem list, medical history, surgical history, family history, social history, and previous encounter notes.  Coral Ceo, am acting as transcriptionist for Coralie Common, MD.   I have reviewed the above documentation for accuracy and completeness, and I agree with the above. - Jinny Blossom, MD

## 2020-10-31 ENCOUNTER — Other Ambulatory Visit: Payer: Self-pay

## 2020-10-31 ENCOUNTER — Ambulatory Visit (AMBULATORY_SURGERY_CENTER): Payer: 59 | Admitting: Gastroenterology

## 2020-10-31 ENCOUNTER — Encounter: Payer: Self-pay | Admitting: Gastroenterology

## 2020-10-31 VITALS — BP 128/79 | HR 69 | Temp 98.9°F | Resp 12 | Ht 70.0 in | Wt 248.0 lb

## 2020-10-31 DIAGNOSIS — Z1211 Encounter for screening for malignant neoplasm of colon: Secondary | ICD-10-CM

## 2020-10-31 MED ORDER — SODIUM CHLORIDE 0.9 % IV SOLN: 500.0000 mL | INTRAVENOUS | Status: DC

## 2020-10-31 NOTE — Progress Notes (Signed)
Report given to PACU, vss 

## 2020-10-31 NOTE — Progress Notes (Signed)
Pt's states no medical or surgical changes since previsit or office visit. 

## 2020-10-31 NOTE — Patient Instructions (Signed)
Handouts provided on diverticulosis and hemorrhoids.   Repeat colonoscopy in 10 years for screening purposes.   YOU HAD AN ENDOSCOPIC PROCEDURE TODAY AT [facility]:   Refer to the procedure report that was given to you for any specific questions about what was found during the examination.  If the procedure report does not answer your questions, please call your gastroenterologist to clarify.  If you requested that your care partner not be given the details of your procedure findings, then the procedure report has been included in a sealed envelope for you to review at your convenience later.  YOU SHOULD EXPECT: Some feelings of bloating in the abdomen. Passage of more gas than usual.  Walking can help get rid of the air that was put into your GI tract during the procedure and reduce the bloating. If you had a lower endoscopy (such as a colonoscopy or flexible sigmoidoscopy) you may notice spotting of blood in your stool or on the toilet paper. If you underwent a bowel prep for your procedure, you may not have a normal bowel movement for a few days.  Please Note:  You might notice some irritation and congestion in your nose or some drainage.  This is from the oxygen used during your procedure.  There is no need for concern and it should clear up in a day or so.  SYMPTOMS TO REPORT IMMEDIATELY:   Following lower endoscopy (colonoscopy or flexible sigmoidoscopy):  Excessive amounts of blood in the stool  Significant tenderness or worsening of abdominal pains  Swelling of the abdomen that is new, acute  Fever of 100F or higher  For urgent or emergent issues, a gastroenterologist can be reached at any hour by calling [phone]. Do not use MyChart messaging for urgent concerns.    DIET:  We do recommend a small meal at first, but then you may proceed to your regular diet.  Drink plenty of fluids but you should avoid alcoholic beverages for 24 hours.  ACTIVITY:  You should  plan to take it easy for the rest of today and you should NOT DRIVE or use heavy machinery until tomorrow (because of the sedation medicines used during the test).    FOLLOW UP: Our staff will call the number listed on your records 48-72 hours following your procedure to check on you and address any questions or concerns that you may have regarding the information given to you following your procedure. If we do not reach you, we will leave a message.  We will attempt to reach you two times.  During this call, we will ask if you have developed any symptoms of COVID 19. If you develop any symptoms (ie: fever, flu-like symptoms, shortness of breath, cough etc.) before then, please call [phone].  If you test positive for Covid 19 in the 2 weeks post procedure, please call and report this information to [place].    If any biopsies were taken you will be contacted by phone or by letter within the next 1-3 weeks.  Please call us at [phone] if you have not heard about the biopsies in 3 weeks.    SIGNATURES/CONFIDENTIALITY: You and/or your care partner have signed paperwork which will be entered into your electronic medical record.  These signatures attest to the fact that that the information above on your After Visit Summary has been reviewed and is understood.  Full responsibility of the confidentiality of this discharge information lies with you and/or your care-partner.

## 2020-10-31 NOTE — Op Note (Signed)
[facility] Patient Name: Cameron Valenzuela Procedure Date: 10/31/2020 9:59 AM MRN: [id number] Endoscopist: Jackquline Denmark , MD Age: 50 Referring MD:  Date of Birth: 06/06/1971 Gender: Male Account #: [id number] Procedure:                Colonoscopy Indications:              Screening for colorectal malignant neoplasm Medicines:                Monitored Anesthesia Care Procedure:                Pre-Anesthesia Assessment:                           - Prior to the procedure, a History and Physical                            was performed, and patient medications and                            allergies were reviewed. The patient's tolerance of                            previous anesthesia was also reviewed. The risks                            and benefits of the procedure and the sedation                            options and risks were discussed with the patient.                            All questions were answered, and informed consent                            was obtained. Prior Anticoagulants: The patient has                            taken no previous anticoagulant or antiplatelet                            agents. ASA Grade Assessment: II - A patient with                            mild systemic disease. After reviewing the risks                            and benefits, the patient was deemed in                            satisfactory condition to undergo the procedure.                           After obtaining informed consent, the colonoscope  was passed under direct vision. Throughout the                            procedure, the patient's blood pressure, pulse, and                            oxygen saturations were monitored continuously. The                            Olympus CF-[id number]) Colonoscope was                            introduced through the anus and advanced to the the                            cecum, identified by  appendiceal orifice and                            ileocecal valve. The colonoscopy was performed                            without difficulty. The patient tolerated the                            procedure well. The quality of the bowel                            preparation was good. The ileocecal valve,                            appendiceal orifice, and rectum were photographed.                            The colon was highly redundant. Passage of scope                            was assisted by abdominal pressure. Scope In: 10:16:51 AM Scope Out: 10:30:41 AM Total Procedure Duration: 0 hours 13 minutes 50 seconds  Findings:                 A few small-mouthed diverticula were found in the                            sigmoid colon.                           Non-bleeding internal hemorrhoids were found during                            retroflexion. The hemorrhoids were small.                           The exam was otherwise without abnormality on  direct and retroflexion views. Complications:            No immediate complications. Estimated Blood Loss:     Estimated blood loss: none. Impression:               - Minimal sigmoid diverticulosis.                           - Non-bleeding internal hemorrhoids.                           - The examination was otherwise normal on direct                            and retroflexion views.                           - No specimens collected. Recommendation:           - Patient has a contact number available for                            emergencies. The signs and symptoms of potential                            delayed complications were discussed with the                            patient. Return to normal activities tomorrow.                            Written discharge instructions were provided to the                            patient.                           - Resume previous diet.                           -  Continue present medications.                           - Repeat colonoscopy in 10 years for screening                            purposes. Earlier, if with any new problems or                            change in family history.                           - The findings and recommendations were discussed                            with the patient's family. Jackquline Denmark, MD 10/31/2020 10:34:10 AM This report has been signed electronically.

## 2020-11-02 ENCOUNTER — Telehealth: Payer: Self-pay

## 2020-11-02 ENCOUNTER — Telehealth: Payer: Self-pay | Admitting: *Deleted

## 2020-11-02 NOTE — Telephone Encounter (Signed)
2nd follow up call attempt. LVM

## 2020-11-02 NOTE — Telephone Encounter (Signed)
  Follow up Call-  Call back number 10/31/2020  Post procedure Call Back phone  # [phone]  Permission to leave phone message Yes  Some recent data might be hidden     1st follow up call made.  NALM

## 2020-11-21 ENCOUNTER — Ambulatory Visit (INDEPENDENT_AMBULATORY_CARE_PROVIDER_SITE_OTHER): Payer: 59 | Admitting: Family Medicine

## 2020-11-24 ENCOUNTER — Other Ambulatory Visit: Payer: Self-pay

## 2020-11-24 ENCOUNTER — Ambulatory Visit (INDEPENDENT_AMBULATORY_CARE_PROVIDER_SITE_OTHER): Payer: 59 | Admitting: Family Medicine

## 2020-11-24 ENCOUNTER — Encounter (INDEPENDENT_AMBULATORY_CARE_PROVIDER_SITE_OTHER): Payer: Self-pay | Admitting: Family Medicine

## 2020-11-24 VITALS — BP 117/70 | HR 71 | Temp 98.4°F | Ht 70.0 in | Wt 242.0 lb

## 2020-11-24 DIAGNOSIS — Z9189 Other specified personal risk factors, not elsewhere classified: Secondary | ICD-10-CM

## 2020-11-24 DIAGNOSIS — E66813 Obesity, class 3: Secondary | ICD-10-CM

## 2020-11-24 DIAGNOSIS — Z6841 Body Mass Index (BMI) 40.0 and over, adult: Secondary | ICD-10-CM

## 2020-11-24 DIAGNOSIS — F3289 Other specified depressive episodes: Secondary | ICD-10-CM

## 2020-11-24 DIAGNOSIS — R7303 Prediabetes: Secondary | ICD-10-CM | POA: Diagnosis not present

## 2020-11-24 DIAGNOSIS — E559 Vitamin D deficiency, unspecified: Secondary | ICD-10-CM | POA: Diagnosis not present

## 2020-11-24 DIAGNOSIS — E7849 Other hyperlipidemia: Secondary | ICD-10-CM

## 2020-11-24 MED ORDER — VITAMIN D (ERGOCALCIFEROL) 1.25 MG (50000 UNIT) PO CAPS: 50000.0000 [IU] | ORAL_CAPSULE | ORAL | 0 refills | Status: DC

## 2020-11-24 MED ORDER — BUPROPION HCL ER (SR) 150 MG PO TB12: 150.0000 mg | ORAL_TABLET | Freq: Every day | ORAL | 0 refills | Status: DC

## 2020-11-24 MED ORDER — FENOFIBRIC ACID 45 MG PO CPDR: 45.0000 mg | DELAYED_RELEASE_CAPSULE | Freq: Every day | ORAL | 0 refills | Status: DC

## 2020-11-24 MED ORDER — METFORMIN HCL 500 MG PO TABS: 500.0000 mg | ORAL_TABLET | Freq: Two times a day (BID) | ORAL | 0 refills | Status: DC

## 2020-11-25 ENCOUNTER — Other Ambulatory Visit (INDEPENDENT_AMBULATORY_CARE_PROVIDER_SITE_OTHER): Payer: Self-pay | Admitting: Family Medicine

## 2020-11-25 DIAGNOSIS — R7303 Prediabetes: Secondary | ICD-10-CM

## 2020-11-25 DIAGNOSIS — E7849 Other hyperlipidemia: Secondary | ICD-10-CM

## 2020-11-28 NOTE — Progress Notes (Signed)
Chief Complaint:   OBESITY Cameron Valenzuela is here to discuss his progress with his obesity treatment plan along with follow-up of his obesity related diagnoses. Cameron Valenzuela is on the Category 4 Plan and states he is following his eating plan approximately 60% of the time. Cameron Valenzuela states he is walking 30 minutes 3 times per week.  Today's visit was #: 65 Starting weight: 256 lbs Starting date: 09/18/2018 Today's weight: 242 lbs Today's date: 11/24/2020 Total lbs lost to date: 14 lbs Total lbs lost since last in-office visit: 0  Interim History: Cameron Valenzuela has had 8 deaths in his 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 since mid March. He has been doing some stress eating. He voices even with Wellbutrin, he's found it difficult to control stress/emotional eating.  Subjective:   1. Other depression Pt denies suicidal or homicidal ideations. Cameron Valenzuela is on Wellbutrin with some control of stress eating, just not as of recent.  2. Vitamin D deficiency Pt denies nausea, vomiting, and muscle weakness but notes fatigue. Pt is on prescription Vit D. Cameron Valenzuela's last Vit D level 38.5.  3. Pre-diabetes Last A1c 5.4 and insulin level 25.0. [name] is on Metformin.  4. Other hyperlipidemia Cameron Valenzuela is on fenofibric acid. His last LDL 127, HDL 30, and triglycerides 344.  5. At risk for heart disease Cameron Valenzuela is at a higher than average risk for cardiovascular disease due to obesity.   Assessment/Plan:   1. Other depression Behavior modification techniques were discussed today to help Cameron Valenzuela deal with his emotional/non-hunger eating behaviors.  Orders and follow up as documented in patient record.   - buPROPion (WELLBUTRIN SR) 150 MG 12 hr tablet; Take 1 tablet (150 mg total) by mouth daily.  Dispense: 30 tablet; Refill: 0  2. Vitamin D deficiency Low Vitamin D level contributes to fatigue and are associated with obesity, breast, and colon cancer. He agrees to continue to take prescription Vitamin D @50 ,000 IU every week and  will follow-up for routine testing of Vitamin D, at least 2-3 times per year to avoid over-replacement.  - Vitamin D, Ergocalciferol, (DRISDOL) 1.25 MG (50000 UNIT) CAPS capsule; Take 1 capsule (50,000 Units total) by mouth every 7 (seven) days.  Dispense: 4 capsule; Refill: 0  3. Pre-diabetes Cameron Valenzuela will continue to work on weight loss, exercise, and decreasing simple carbohydrates to help decrease the risk of diabetes.   - metFORMIN (GLUCOPHAGE) 500 MG tablet; Take 1 tablet (500 mg total) by mouth 2 (two) times daily with a meal.  Dispense: 60 tablet; Refill: 0  4. Other hyperlipidemia Cardiovascular risk and specific lipid/LDL goals reviewed.  We discussed several lifestyle modifications today and Cameron Valenzuela will continue to work on diet, exercise and weight loss efforts. Orders and follow up as documented in patient record.   Counseling Intensive lifestyle modifications are the first line treatment for this issue. . Dietary changes: Increase soluble fiber. Decrease simple carbohydrates. . Exercise changes: Moderate to vigorous-intensity aerobic activity 150 minutes per week if tolerated. . Lipid-lowering medications: see documented in medical record.  - Choline Fenofibrate (FENOFIBRIC ACID) 45 MG CPDR; Take 45 mg by mouth daily.  Dispense: 30 capsule; Refill: 0  5. At risk for heart disease Cameron Valenzuela was given approximately 15 minutes of coronary artery disease prevention counseling today. He is 50 y.o. male and has risk factors for heart disease including obesity. We discussed intensive lifestyle modifications today with an emphasis on specific weight loss instructions and strategies.   Repetitive spaced learning was employed today to elicit superior memory formation  and behavioral change.  6. Class 3 severe obesity with serious comorbidity and body mass index (BMI) of 40.0 to 44.9 in adult, unspecified obesity type (HCC)  Cameron Valenzuela is currently in the action stage of change. As such, his  goal is to continue with weight loss efforts. He has agreed to the Category 4 Plan.   Exercise goals: As is  Behavioral modification strategies: increasing lean protein intake, meal planning and cooking strategies, keeping healthy foods in the home and planning for success.  Cameron Valenzuela has agreed to follow-up with our clinic in 3 weeks. He was informed of the importance of frequent follow-up visits to maximize his success with intensive lifestyle modifications for his multiple health conditions.   Objective:   Blood pressure 117/70, pulse 71, temperature 98.4 F (36.9 C), height 5\' 10"  (1.778 m), weight 242 lb (109.8 kg), SpO2 99 %. Body mass index is 34.72 kg/m.  General: Cooperative, alert, well developed, in no acute distress. HEENT: Conjunctivae and lids unremarkable. Cardiovascular: Regular rhythm.  Lungs: Normal work of breathing. Neurologic: No focal deficits.   Lab Results  Component Value Date   CREATININE 0.93 05/10/2020   BUN 20 05/10/2020   NA 141 05/10/2020   K 4.4 05/10/2020   CL 103 05/10/2020   CO2 25 05/10/2020   Lab Results  Component Value Date   ALT 33 05/10/2020   AST 22 05/10/2020   ALKPHOS 68 05/10/2020   BILITOT 0.4 05/10/2020   Lab Results  Component Value Date   HGBA1C 5.4 05/10/2020   HGBA1C 5.2 12/24/2019   HGBA1C 5.1 07/27/2019   HGBA1C 5.0 02/11/2019   HGBA1C 6.2 (H) 09/18/2018   Lab Results  Component Value Date   INSULIN 25.0 (H) 05/10/2020   INSULIN 18.7 12/24/2019   INSULIN 22.7 07/27/2019   INSULIN 16.4 02/11/2019   INSULIN 60.2 (H) 09/18/2018   Lab Results  Component Value Date   TSH 5.19 (H) 08/12/2020   Lab Results  Component Value Date   CHOL 218 (H) 05/10/2020   HDL 30 (L) 05/10/2020   LDLCALC 127 (H) 05/10/2020   LDLDIRECT 116.0 06/24/2018   TRIG 344 (H) 05/10/2020   CHOLHDL 8 06/24/2018   Lab Results  Component Value Date   WBC 5.3 08/12/2020   HGB 14.3 08/12/2020   HCT 42.5 08/12/2020   MCV 91.3 08/12/2020    PLT 165.0 08/12/2020   No results found for: IRON, TIBC, FERRITIN  Attestation Statements:   Reviewed by clinician on day of visit: allergies, medications, problem list, medical history, surgical history, family history, social history, and previous encounter notes.  Coral Ceo, CMA, am acting as transcriptionist for Coralie Common, MD.   I have reviewed the above documentation for accuracy and completeness, and I agree with the above. - Jinny Blossom, MD

## 2020-12-22 ENCOUNTER — Ambulatory Visit (INDEPENDENT_AMBULATORY_CARE_PROVIDER_SITE_OTHER): Payer: 59 | Admitting: Family Medicine

## 2020-12-26 ENCOUNTER — Encounter (INDEPENDENT_AMBULATORY_CARE_PROVIDER_SITE_OTHER): Payer: Self-pay

## 2020-12-27 ENCOUNTER — Other Ambulatory Visit (INDEPENDENT_AMBULATORY_CARE_PROVIDER_SITE_OTHER): Payer: Self-pay | Admitting: Family Medicine

## 2020-12-27 DIAGNOSIS — E7849 Other hyperlipidemia: Secondary | ICD-10-CM

## 2020-12-27 DIAGNOSIS — R7303 Prediabetes: Secondary | ICD-10-CM

## 2020-12-27 NOTE — Telephone Encounter (Signed)
Can these be refilled? Pt was canceled due to Dr. Avie Arenas need to be absent this week.

## 2020-12-29 ENCOUNTER — Ambulatory Visit (INDEPENDENT_AMBULATORY_CARE_PROVIDER_SITE_OTHER): Payer: 59 | Admitting: Family Medicine

## 2021-01-17 ENCOUNTER — Ambulatory Visit: Payer: 59 | Admitting: Physician Assistant

## 2021-01-18 ENCOUNTER — Ambulatory Visit (INDEPENDENT_AMBULATORY_CARE_PROVIDER_SITE_OTHER): Payer: 59 | Admitting: Family Medicine

## 2021-01-20 ENCOUNTER — Other Ambulatory Visit: Payer: Self-pay | Admitting: Urology

## 2021-01-20 ENCOUNTER — Other Ambulatory Visit (INDEPENDENT_AMBULATORY_CARE_PROVIDER_SITE_OTHER): Payer: Self-pay | Admitting: Adult Health

## 2021-01-20 DIAGNOSIS — E7849 Other hyperlipidemia: Secondary | ICD-10-CM

## 2021-01-20 DIAGNOSIS — N200 Calculus of kidney: Secondary | ICD-10-CM

## 2021-01-20 DIAGNOSIS — R7303 Prediabetes: Secondary | ICD-10-CM

## 2021-01-25 NOTE — Telephone Encounter (Signed)
Last seen by Dr. Ukleja. 

## 2021-01-27 ENCOUNTER — Other Ambulatory Visit: Payer: Self-pay

## 2021-01-27 ENCOUNTER — Other Ambulatory Visit (INDEPENDENT_AMBULATORY_CARE_PROVIDER_SITE_OTHER): Payer: 59

## 2021-01-27 DIAGNOSIS — E038 Other specified hypothyroidism: Secondary | ICD-10-CM | POA: Diagnosis not present

## 2021-01-27 NOTE — Addendum Note (Signed)
Addended by: CREFT, Kristine Garbe L on: 01/27/2021 02:05 PM   Modules accepted: Orders

## 2021-01-30 LAB — THYROID PEROXIDASE ANTIBODIES (TPO) (REFL): Thyroperoxidase Ab SerPl-aCnc: <1 IU/mL (ref <9)

## 2021-01-30 LAB — TSH: TSH: 3.31 mIU/L (ref 0.40–4.50)

## 2021-01-30 LAB — T4, FREE: Free T4: 1.1 ng/dL (ref 0.8–1.8)

## 2021-01-31 ENCOUNTER — Other Ambulatory Visit: Payer: Self-pay

## 2021-01-31 ENCOUNTER — Ambulatory Visit: Payer: 59 | Admitting: Physician Assistant

## 2021-01-31 ENCOUNTER — Encounter: Payer: Self-pay | Admitting: Physician Assistant

## 2021-01-31 DIAGNOSIS — Z1283 Encounter for screening for malignant neoplasm of skin: Secondary | ICD-10-CM

## 2021-01-31 DIAGNOSIS — L820 Inflamed seborrheic keratosis: Secondary | ICD-10-CM

## 2021-01-31 DIAGNOSIS — Z808 Family history of malignant neoplasm of other organs or systems: Secondary | ICD-10-CM | POA: Diagnosis not present

## 2021-01-31 DIAGNOSIS — L570 Actinic keratosis: Secondary | ICD-10-CM

## 2021-01-31 DIAGNOSIS — Z85828 Personal history of other malignant neoplasm of skin: Secondary | ICD-10-CM

## 2021-01-31 NOTE — Progress Notes (Signed)
   Follow-Up Visit   Subjective  Cameron Valenzuela is a 50 y.o. male who presents for the following: Annual Exam (No real concerns. No personal history of melanoma, patient has had a non mole skin cancer. Patient does have family history of non mole skin cancer, but no melanoma).   The following portions of the chart were reviewed this encounter and updated as appropriate:      Objective  Well appearing patient in no apparent distress; mood and affect are within normal limits.  A full examination was performed including scalp, head, eyes, ears, nose, lips, neck, chest, axillae, abdomen, back, buttocks, bilateral upper extremities, bilateral lower extremities, hands, feet, fingers, toes, fingernails, and toenails. All findings within normal limits unless otherwise noted below.  Left Nasal Sidewall, Left Parotid Area (3), Mid Parietal Scalp (2), Right Buccal Cheek Erythematous patches with gritty scale.  Right Parotid Area Erythematous stuck-on,crusty brown plaque.    Assessment & Plan  AK (actinic keratosis) (7) Mid Parietal Scalp (2); Left Nasal Sidewall; Left Parotid Area (3); Right Buccal Cheek  Will plan on using topical treatment in the winter.   Destruction of lesion - Left Nasal Sidewall, Left Parotid Area, Mid Parietal Scalp, Right Buccal Cheek Complexity: simple   Destruction method: cryotherapy   Informed consent: discussed and consent obtained   Timeout:  patient name, date of birth, surgical site, and procedure verified Lesion destroyed using liquid nitrogen: Yes   Cryotherapy cycles:  3 Outcome: patient tolerated procedure well with no complications   Post-procedure details: wound care instructions given    Inflamed seborrheic keratosis Right Parotid Area  Destruction of lesion - Right Parotid Area Complexity: simple   Destruction method: cryotherapy   Informed consent: discussed and consent obtained   Timeout:  patient name, date of birth, surgical site, and  procedure verified Lesion destroyed using liquid nitrogen: Yes   Cryotherapy cycles:  1 Outcome: patient tolerated procedure well with no complications   Post-procedure details: wound care instructions given      I, Arryanna Holquin, PA-C, have reviewed all documentation's for this visit.  The documentation on 01/31/21 for the exam, diagnosis, procedures and orders are all accurate and complete.

## 2021-02-01 ENCOUNTER — Ambulatory Visit (INDEPENDENT_AMBULATORY_CARE_PROVIDER_SITE_OTHER): Payer: 59 | Admitting: Family Medicine

## 2021-02-01 ENCOUNTER — Other Ambulatory Visit: Payer: Self-pay

## 2021-02-01 ENCOUNTER — Encounter (INDEPENDENT_AMBULATORY_CARE_PROVIDER_SITE_OTHER): Payer: Self-pay | Admitting: Family Medicine

## 2021-02-01 VITALS — BP 114/69 | HR 75 | Temp 98.2°F | Ht 70.0 in | Wt 240.0 lb

## 2021-02-01 DIAGNOSIS — Z6841 Body Mass Index (BMI) 40.0 and over, adult: Secondary | ICD-10-CM | POA: Diagnosis not present

## 2021-02-01 DIAGNOSIS — E559 Vitamin D deficiency, unspecified: Secondary | ICD-10-CM | POA: Diagnosis not present

## 2021-02-01 DIAGNOSIS — E66813 Obesity, class 3: Secondary | ICD-10-CM

## 2021-02-01 DIAGNOSIS — R7303 Prediabetes: Secondary | ICD-10-CM | POA: Diagnosis not present

## 2021-02-01 DIAGNOSIS — Z9189 Other specified personal risk factors, not elsewhere classified: Secondary | ICD-10-CM | POA: Diagnosis not present

## 2021-02-01 MED ORDER — VITAMIN D (ERGOCALCIFEROL) 1.25 MG (50000 UNIT) PO CAPS: 50000.0000 [IU] | ORAL_CAPSULE | ORAL | 0 refills | Status: DC

## 2021-02-07 NOTE — Progress Notes (Signed)
Chief Complaint:   OBESITY Cameron Valenzuela is here to discuss his progress with his obesity treatment plan along with follow-up of his obesity related diagnoses. Cameron Valenzuela is on the Category 4 Plan and states he is following his eating plan approximately 50% of the time. Cameron Valenzuela states he is walking 10,000 to 15,000 steps for 5 times per week.  Today's visit was #: 61 Starting weight: 256 lbs Starting date: 09/18/2018 Today's weight: 240 lbs Today's date: 02/01/2021 Total lbs lost to date: 16 Total lbs lost since last in-office visit: 2  Interim History: Cameron Valenzuela has been tending to his wife who recently had back surgery. Other than that, he has just been working. Cameron Valenzuela has struggled with getting back on track. Breakfast and lunch are relatively easy to stay on track. Dinner is particularly difficult. He has been eating out more frequently recently.  Subjective:   1. Vitamin D deficiency He is currently taking prescription vitamin D 50,000 IU each week. Diesel's last vitamin D level was 38.5. He notes fatigue and denies nausea, vomiting, or muscle weakness.  Lab Results  Component Value Date   VD25OH 38.5 05/10/2020   VD25OH 44.3 12/24/2019   VD25OH 20.8 (L) 07/27/2019   2. Pre-diabetes Cameron Valenzuela has a diagnosis of prediabetes based on his elevated HgA1c of 5.4 and insulin of 25.0. He is taking metformin. He was informed this puts him at greater risk of developing diabetes. He continues to work on diet and exercise to decrease his risk of diabetes. He denies nausea or hypoglycemia.  Lab Results  Component Value Date   HGBA1C 5.4 05/10/2020   Lab Results  Component Value Date   INSULIN 25.0 (H) 05/10/2020   INSULIN 18.7 12/24/2019   INSULIN 22.7 07/27/2019   INSULIN 16.4 02/11/2019   INSULIN 60.2 (H) 09/18/2018   3. At risk for osteoporosis Cameron Valenzuela is at risk for osteoporosis due to her vitamin D deficiency.  Assessment/Plan:   1. Vitamin D deficiency Low Vitamin D level  contributes to fatigue and are associated with obesity, breast, and colon cancer. He agrees to continue to take prescription Vitamin D '@50'$ ,000 IU every week and will follow-up for routine testing of Vitamin D, at least 2-3 times per year to avoid over-replacement.   - Vitamin D, Ergocalciferol, (DRISDOL) 1.25 MG (50000 UNIT) CAPS capsule; Take 1 capsule (50,000 Units total) by mouth every 7 (seven) days.  Dispense: 4 capsule; Refill: 0  2. Pre-diabetes Cameron Valenzuela agrees to continue taking metformin and does not need a refill at this time. Cameron Valenzuela will continue to work on weight loss, exercise, and decreasing simple carbohydrates to help decrease the risk of diabetes.   3. At risk for osteoporosis Cameron Valenzuela was given approximately 15 minutes of osteoporosis prevention counseling today. Cameron Valenzuela is at risk for osteopenia and osteoporosis due to his Vitamin D deficiency. He was encouraged to take his Vitamin D and follow his higher calcium diet and increase strengthening exercise to help strengthen his bones and decrease his risk of osteopenia and osteoporosis.  Repetitive spaced learning was employed today to elicit superior memory formation and behavioral change.   4. Obesity Current BMI 34 Cameron Valenzuela is currently in the action stage of change. As such, his goal is to continue with weight loss efforts. He has agreed to the Category 4 Plan and keeping a food journal and adhering to recommended goals of 550 to 700 calories and 50+ grams of protein for supper.   Exercise goals:  As is.  Behavioral modification  strategies: increasing lean protein intake, decreasing eating out, no skipping meals, meal planning and cooking strategies, and keeping a strict food journal.  Cameron Valenzuela has agreed to follow-up with our clinic in 3 weeks for a repeat IC. He was informed of the importance of frequent follow-up visits to maximize his success with intensive lifestyle modifications for his multiple health conditions.    Objective:   Blood pressure 114/69, pulse 75, temperature 98.2 F (36.8 C), temperature source Oral, height '5\' 10"'$  (1.778 m), weight 240 lb (108.9 kg), SpO2 98 %. Body mass index is 34.44 kg/m.  General: Cooperative, alert, well developed, in no acute distress. HEENT: Conjunctivae and lids unremarkable. Cardiovascular: Regular rhythm.  Lungs: Normal work of breathing. Neurologic: No focal deficits.   Lab Results  Component Value Date   CREATININE 0.93 05/10/2020   BUN 20 05/10/2020   NA 141 05/10/2020   K 4.4 05/10/2020   CL 103 05/10/2020   CO2 25 05/10/2020   Lab Results  Component Value Date   ALT 33 05/10/2020   AST 22 05/10/2020   ALKPHOS 68 05/10/2020   BILITOT 0.4 05/10/2020   Lab Results  Component Value Date   HGBA1C 5.4 05/10/2020   HGBA1C 5.2 12/24/2019   HGBA1C 5.1 07/27/2019   HGBA1C 5.0 02/11/2019   HGBA1C 6.2 (H) 09/18/2018   Lab Results  Component Value Date   INSULIN 25.0 (H) 05/10/2020   INSULIN 18.7 12/24/2019   INSULIN 22.7 07/27/2019   INSULIN 16.4 02/11/2019   INSULIN 60.2 (H) 09/18/2018   Lab Results  Component Value Date   TSH 3.31 01/27/2021   Lab Results  Component Value Date   CHOL 218 (H) 05/10/2020   HDL 30 (L) 05/10/2020   LDLCALC 127 (H) 05/10/2020   LDLDIRECT 116.0 06/24/2018   TRIG 344 (H) 05/10/2020   CHOLHDL 8 06/24/2018   Lab Results  Component Value Date   VD25OH 38.5 05/10/2020   VD25OH 44.3 12/24/2019   VD25OH 20.8 (L) 07/27/2019   Lab Results  Component Value Date   WBC 5.3 08/12/2020   HGB 14.3 08/12/2020   HCT 42.5 08/12/2020   MCV 91.3 08/12/2020   PLT 165.0 08/12/2020   No results found for: IRON, TIBC, FERRITIN  Attestation Statements:   Reviewed by clinician on day of visit: allergies, medications, problem list, medical history, surgical history, family history, social history, and previous encounter notes.  IMarcille Blanco, CMA, am acting as transcriptionist for Coralie Common, MD   I  have reviewed the above documentation for accuracy and completeness, and I agree with the above. - Coralie Common, MD

## 2021-02-20 ENCOUNTER — Encounter: Payer: Self-pay | Admitting: Genetic Counselor

## 2021-02-20 NOTE — Progress Notes (Signed)
Left message for patient to call back for ESWL instructions.

## 2021-02-21 NOTE — Progress Notes (Signed)
Left message for patient to please return call to discuss preparations for procedure 8/11

## 2021-02-22 ENCOUNTER — Encounter: Payer: Self-pay | Admitting: Genetic Counselor

## 2021-02-22 ENCOUNTER — Inpatient Hospital Stay: Payer: 59 | Attending: Genetic Counselor | Admitting: Genetic Counselor

## 2021-02-22 DIAGNOSIS — Z803 Family history of malignant neoplasm of breast: Secondary | ICD-10-CM | POA: Diagnosis not present

## 2021-02-22 DIAGNOSIS — Z8041 Family history of malignant neoplasm of ovary: Secondary | ICD-10-CM | POA: Diagnosis not present

## 2021-02-22 NOTE — Progress Notes (Signed)
REFERRING PROVIDER: Colon Branch, MD [address] [place],  [place] [address]  PRIMARY PROVIDER:  Colon Branch, MD  PRIMARY REASON FOR VISIT:  1. Family history of breast cancer   2. Family history of ovarian cancer      HISTORY OF PRESENT ILLNESS:  I connected with  Cameron Valenzuela on 02/22/2021 at 1 PM EDT by MyChart video conference and verified that I am speaking with the correct person using two identifiers.   Patient location: Work Provider location: [facility]   Cameron Valenzuela, a 50 y.o. male, was seen for a [facility] cancer genetics consultation at the request of Dr. Larose Kells due to a family history of breast and ovarian cancer.  Cameron Valenzuela presents to clinic today to discuss the possibility of a hereditary predisposition to cancer, genetic testing, and to further clarify his future cancer risks, as well as potential cancer risks for family members.   Cameron Valenzuela is a 50 y.o. male with no personal history of cancer.    CANCER HISTORY:  Oncology History   No history exists.    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 Karsten Ro   CYSTOSCOPY WITH STENT PLACEMENT Left 02/18/2017   Procedure: CYSTOSCOPY/ LEFT RETROGRADE/ WITH LEFT STENT PLACEMENT;  Surgeon: Kathie Rhodes, MD;  Location: [facility];  Service: Urology;  Laterality: Left;   EXTRACORPOREAL SHOCK WAVE LITHOTRIPSY Left 02/18/2017   Procedure: LEFT EXTRACORPOREAL SHOCK  WAVE LITHOTRIPSY (ESWL);  Surgeon: Franchot Gallo, MD;  Location: [facility];  Service: Urology;  Laterality: Left;   EXTRACORPOREAL SHOCK WAVE LITHOTRIPSY Left 04/11/2017   Procedure: LEFT EXTRACORPOREAL SHOCK WAVE LITHOTRIPSY (ESWL);  Surgeon: Irine Seal, MD;  Location: [facility];  Service: Urology;  Laterality: Left;   LITHOTRIPSY     02/18/17   neck bipsy     beningn, in his 27s    Social History   Socioeconomic History   Marital status: Married    Spouse name: Vaughan Basta   Number of children: 1   Years of education: Not on file   Highest education level: Not on file  Occupational History   Occupation: Health and safety inspector , 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  Tobacco Use   Smoking status: Never   Smokeless tobacco: Never  Vaping Use   Vaping Use: Never used  Substance and Sexual Activity   Alcohol use: Not Currently    Comment: rarely   Drug use: No   Sexual activity: Yes  Other Topics Concern   Not on file  Social History Narrative   Household-- pt, wife, child     (mother in law [name], passed away 09-22-15)   Social Determinants of Health   Financial Resource Strain: Not on file  Food Insecurity: Not on file  Transportation Needs: Not on file  Physical Activity: Not on file  Stress: Not on file  Social Connections: Not on file     FAMILY HISTORY:  We obtained a detailed, 4-generation family history.  Significant diagnoses are listed below: Family History  Problem Relation Age of Onset   Hyperlipidemia Mother        M and others   Cancer Mother    Breast cancer Mother        dx early 37s   Diabetes Father    COPD Father    Heart disease Father        ?   Stroke Father    Sleep apnea Father    Obesity Father    Ovarian cancer Maternal Grandmother 29   Heart disease Maternal Grandfather    Heart disease Paternal Grandmother    Diabetes Other        many fam members    Colon cancer Neg Hx    Prostate cancer Neg Hx    Colon polyps Neg Hx    Esophageal cancer Neg Hx     Rectal cancer Neg Hx    Stomach cancer Neg Hx     The patient has one daughter who is cancer free.  He has a brother and sister who are cancer free.  Both parents are living.  The patient's father is in his 23's.  He is one of 7 kids, none who have cancer.  His parents are deceased from non-cancer related issues.  The patient's mother had breast cancer in her early 67's.  She has two sisters and three brothers, none had cancer.  His grandmother had ovarian cancer at 36.    Cameron Valenzuela is unaware of previous family history of genetic testing for hereditary cancer risks. Patient's maternal ancestors are of [place] descent, and paternal ancestors are of [place] descent. There is no reported Ashkenazi Jewish ancestry. There is no known consanguinity.  GENETIC COUNSELING ASSESSMENT: Cameron Valenzuela is a 50 y.o. male with a family history of cancer which is somewhat suggestive of a hereditary cancer syndrome and predisposition to cancer given the combination of cancer in the family and younger ages of onset. We, therefore, discussed and recommended the following at today's visit.   DISCUSSION: We discussed that 5 - 10% of cancer is hereditary, with each type of cancer having its own hereditary risk.  Breast cancer has a 5-10% chance of being hereditary, whereas ovarian cancer can be up to 20% of cases are hereditary.  Most cases of breast and ovarian cancer are associated with BRCA mutations.  There are other genes that can be associated with hereditary breast and ovarian cancer syndromes.  These include PALB2, ATM and a few others.  We discussed that testing is beneficial for several reasons including knowing how to follow individuals after completing their treatment, identifying whether potential treatment options such as PARP inhibitors would be beneficial, and understand if other family members could be at risk for cancer and allow them to undergo genetic testing.   We reviewed the characteristics,  features and inheritance patterns of hereditary cancer syndromes. We also discussed genetic testing, including the appropriate family members to test, the process of testing, insurance coverage and turn-around-time for results. We discussed the implications of a negative, positive, carrier and/or variant of uncertain significant result. We recommended Cameron Valenzuela pursue genetic testing for the CancerNext-Expanded+RNAinsight gene panel. The CancerNext-Expanded gene panel offered by [facility] and includes sequencing  and rearrangement analysis for the following 77 genes: AIP, ALK, APC*, ATM*, AXIN2, BAP1, BARD1, BLM, BMPR1A, BRCA1*, BRCA2*, BRIP1*, CDC73, CDH1*, CDK4, CDKN1B, CDKN2A, CHEK2*, CTNNA1, DICER1, FANCC, FH, FLCN, GALNT12, KIF1B, LZTR1, MAX, MEN1, MET, MLH1*, MSH2*, MSH3, MSH6*, MUTYH*, NBN, NF1*, NF2, NTHL1, PALB2*, PHOX2B, PMS2*, POT1, PRKAR1A, PTCH1, PTEN*, RAD51C*, RAD51D*, RB1, RECQL, RET, SDHA, SDHAF2, SDHB, SDHC, SDHD, SMAD4, SMARCA4, SMARCB1, SMARCE1, STK11, SUFU, TMEM127, TP53*, TSC1, TSC2, VHL and XRCC2 (sequencing and deletion/duplication); EGFR, EGLN1, HOXB13, KIT, MITF, PDGFRA, POLD1, and POLE (sequencing only); EPCAM and GREM1 (deletion/duplication only). DNA and RNA analyses performed for * genes.   Based on Cameron Valenzuela's family history of cancer, he meets medical criteria for genetic testing. Despite that he meets criteria, he may still have an out of pocket cost. We discussed that if his out of pocket cost for testing is over $100, the laboratory will call and confirm whether he wants to proceed with testing.  If the out of pocket cost of testing is less than $100 he will be billed by the genetic testing laboratory.   PLAN: After considering the risks, benefits, and limitations, Cameron Valenzuela provided informed consent to pursue genetic testing and the blood sample was sent to [facility] for analysis of the CancerNext-Expanded+RNAinsight. Results should be available within  approximately 2-3 weeks' time, at which point they will be disclosed by telephone to Cameron Valenzuela, as will any additional recommendations warranted by these results. Cameron Valenzuela will receive a summary of his genetic counseling visit and a copy of his results once available. This information will also be available in Epic.   Lastly, we encouraged Cameron Valenzuela to remain in contact with cancer genetics annually so that we can continuously update the family history and inform him of any changes in cancer genetics and testing that may be of benefit for this family.   Cameron Valenzuela's questions were answered to his satisfaction today. Our contact information was provided should additional questions or concerns arise. Thank you for the referral and allowing [place] to share in the care of your patient.   Martez Weiand P. Florene Glen, [place], [facility] Licensed, Insurance risk surveyor [email or web address] phone: [phone]  The patient was seen for a total of 40 minutes in face-to-face genetic counseling.  The patient was seen alone.  This patient was discussed with Drs. Magrinat, Lindi Adie and/or Burr Medico who agrees with the above.    _______________________________________________________________________ For Office Staff:  Number of people involved in session: 1 Was an Intern/ student involved with case: no

## 2021-02-22 NOTE — Progress Notes (Signed)
Patient to arrive at 1200 on 02/23/2021. History and medications reviewed, Pre-procedure instructions given. Instructed to take evening metformin dose but hold in am of procedure. NPO after 0800 except for clear liquids until 1000. Driver secured.

## 2021-02-23 ENCOUNTER — Ambulatory Visit (HOSPITAL_BASED_OUTPATIENT_CLINIC_OR_DEPARTMENT_OTHER)
Admission: RE | Admit: 2021-02-23 | Discharge: 2021-02-23 | Disposition: A | Discharge status: Home / Self Care | Payer: 59 | Source: Ambulatory Visit | Attending: Urology | Admitting: Urology

## 2021-02-23 ENCOUNTER — Encounter (HOSPITAL_BASED_OUTPATIENT_CLINIC_OR_DEPARTMENT_OTHER)
Admission: RE | Disposition: A | Discharge status: Home / Self Care | Payer: Self-pay | Source: Ambulatory Visit | Attending: Urology

## 2021-02-23 ENCOUNTER — Ambulatory Visit (HOSPITAL_COMMUNITY): Payer: 59

## 2021-02-23 ENCOUNTER — Encounter (HOSPITAL_BASED_OUTPATIENT_CLINIC_OR_DEPARTMENT_OTHER): Payer: Self-pay | Admitting: Urology

## 2021-02-23 DIAGNOSIS — N200 Calculus of kidney: Secondary | ICD-10-CM | POA: Insufficient documentation

## 2021-02-23 HISTORY — PX: EXTRACORPOREAL SHOCK WAVE LITHOTRIPSY: SHX1557

## 2021-02-23 SURGERY — LITHOTRIPSY, ESWL
Anesthesia: LOCAL | Laterality: Left

## 2021-02-23 MED ORDER — DIPHENHYDRAMINE HCL 25 MG PO CAPS
25.0000 mg | ORAL_CAPSULE | ORAL | Status: AC
  Administered 2021-02-23: 25 mg via ORAL

## 2021-02-23 MED ORDER — CIPROFLOXACIN HCL 500 MG PO TABS
500.0000 mg | ORAL_TABLET | ORAL | Status: AC
  Administered 2021-02-23: 500 mg via ORAL

## 2021-02-23 MED ORDER — SODIUM CHLORIDE 0.9 % IV SOLN: INTRAVENOUS | Status: DC

## 2021-02-23 MED ORDER — DIAZEPAM 5 MG PO TABS
ORAL_TABLET | ORAL | Status: AC
  Filled 2021-02-23: qty 2

## 2021-02-23 MED ORDER — DIAZEPAM 5 MG PO TABS
10.0000 mg | ORAL_TABLET | ORAL | Status: AC
  Administered 2021-02-23: 10 mg via ORAL

## 2021-02-23 MED ORDER — CIPROFLOXACIN HCL 500 MG PO TABS
ORAL_TABLET | ORAL | Status: AC
  Filled 2021-02-23: qty 1

## 2021-02-23 MED ORDER — HYDROCODONE-ACETAMINOPHEN 5-325 MG PO TABS: 1.0000 | ORAL_TABLET | ORAL | 0 refills | Status: DC | PRN

## 2021-02-23 MED ORDER — TAMSULOSIN HCL 0.4 MG PO CAPS: 0.4000 mg | ORAL_CAPSULE | Freq: Every day | ORAL | 1 refills | Status: DC

## 2021-02-23 MED ORDER — DIPHENHYDRAMINE HCL 25 MG PO CAPS
ORAL_CAPSULE | ORAL | Status: AC
  Filled 2021-02-23: qty 1

## 2021-02-23 NOTE — Op Note (Signed)
See [facility] OP note scanned into chart. Also because of the size, density, location and other factors that cannot be anticipated I feel this will likely be a staged procedure. This fact supersedes any indication in the scanned [facility] operative note to the contrary.  

## 2021-02-23 NOTE — H&P (Signed)
See scanned H&P

## 2021-02-24 ENCOUNTER — Encounter (HOSPITAL_BASED_OUTPATIENT_CLINIC_OR_DEPARTMENT_OTHER): Payer: Self-pay | Admitting: Urology

## 2021-02-28 ENCOUNTER — Inpatient Hospital Stay: Payer: 59

## 2021-02-28 ENCOUNTER — Other Ambulatory Visit: Payer: Self-pay

## 2021-03-01 ENCOUNTER — Encounter (INDEPENDENT_AMBULATORY_CARE_PROVIDER_SITE_OTHER): Payer: Self-pay

## 2021-03-01 ENCOUNTER — Other Ambulatory Visit (INDEPENDENT_AMBULATORY_CARE_PROVIDER_SITE_OTHER): Payer: Self-pay | Admitting: Family Medicine

## 2021-03-01 DIAGNOSIS — R7303 Prediabetes: Secondary | ICD-10-CM

## 2021-03-01 DIAGNOSIS — E7849 Other hyperlipidemia: Secondary | ICD-10-CM

## 2021-03-01 NOTE — Telephone Encounter (Signed)
Message sent to [facility] 

## 2021-03-06 ENCOUNTER — Ambulatory Visit (INDEPENDENT_AMBULATORY_CARE_PROVIDER_SITE_OTHER): Payer: 59 | Admitting: Family Medicine

## 2021-04-03 ENCOUNTER — Other Ambulatory Visit: Payer: Self-pay

## 2021-04-03 ENCOUNTER — Encounter (INDEPENDENT_AMBULATORY_CARE_PROVIDER_SITE_OTHER): Payer: Self-pay | Admitting: Family Medicine

## 2021-04-03 ENCOUNTER — Ambulatory Visit (INDEPENDENT_AMBULATORY_CARE_PROVIDER_SITE_OTHER): Payer: 59 | Admitting: Family Medicine

## 2021-04-03 VITALS — BP 123/75 | HR 75 | Temp 97.4°F | Ht 70.0 in | Wt 242.0 lb

## 2021-04-03 DIAGNOSIS — R7303 Prediabetes: Secondary | ICD-10-CM | POA: Diagnosis not present

## 2021-04-03 DIAGNOSIS — F3289 Other specified depressive episodes: Secondary | ICD-10-CM

## 2021-04-03 DIAGNOSIS — E559 Vitamin D deficiency, unspecified: Secondary | ICD-10-CM

## 2021-04-03 DIAGNOSIS — Z6834 Body mass index (BMI) 34.0-34.9, adult: Secondary | ICD-10-CM

## 2021-04-03 DIAGNOSIS — Z9189 Other specified personal risk factors, not elsewhere classified: Secondary | ICD-10-CM | POA: Diagnosis not present

## 2021-04-03 DIAGNOSIS — E7849 Other hyperlipidemia: Secondary | ICD-10-CM

## 2021-04-03 DIAGNOSIS — E669 Obesity, unspecified: Secondary | ICD-10-CM

## 2021-04-03 MED ORDER — FENOFIBRIC ACID 45 MG PO CPDR: 1.0000 | DELAYED_RELEASE_CAPSULE | Freq: Every day | ORAL | 0 refills | Status: DC

## 2021-04-03 MED ORDER — VITAMIN D (ERGOCALCIFEROL) 1.25 MG (50000 UNIT) PO CAPS: 50000.0000 [IU] | ORAL_CAPSULE | ORAL | 0 refills | Status: DC

## 2021-04-03 MED ORDER — BUPROPION HCL ER (SR) 150 MG PO TB12: ORAL_TABLET | ORAL | 0 refills | Status: DC

## 2021-04-03 MED ORDER — METFORMIN HCL 500 MG PO TABS: 500.0000 mg | ORAL_TABLET | Freq: Two times a day (BID) | ORAL | 0 refills | Status: DC

## 2021-04-03 NOTE — Progress Notes (Signed)
Chief Complaint:   OBESITY Cameron Valenzuela is here to discuss his progress with his obesity treatment plan along with follow-up of his obesity related diagnoses. Cameron Valenzuela is on the Category 4 Plan and keeping a food journal and adhering to recommended goals of 550-700 calories and 50 grams protein with supper and states he is following his eating plan approximately 50% of the time. Cameron Valenzuela states he is doing 10,000-15,000 steps a day.  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 He eats out for dinner a lot. His wife shops for food and struggles.  Subjective:   1. Other depression with emotional eating Pt is experiencing emotional eating with increased stress at work. He denies issues with sleep.  2. Other hyperlipidemia Cameron Valenzuela is tolerating medication(s) well without side effects.  Medication compliance is good and patient appears to be taking it as prescribed.  Denies additional concerns regarding this condition.   3. Pre-diabetes He is not taking Metformin BID regularly but plans to increase efficacy.  4. Vitamin D deficiency Cameron Valenzuela has no issues with compliance. He is currently taking prescription vitamin D 50,000 IU each week. He denies nausea, vomiting or muscle weakness.  5. At risk for side effect of medication Cameron Valenzuela is at risk for side effects of medication due to increased dose of Wellbutrin.  Assessment/Plan:  No orders of the defined types were placed in this encounter.   Medications Discontinued During This Encounter  Medication Reason   buPROPion (WELLBUTRIN SR) 150 MG 12 hr tablet Reorder   Choline Fenofibrate (FENOFIBRIC ACID) 45 MG CPDR Reorder   metFORMIN (GLUCOPHAGE) 500 MG tablet Reorder    Vitamin D, Ergocalciferol, (DRISDOL) 1.25 MG (50000 UNIT) CAPS capsule Reorder     Meds ordered this encounter  Medications   buPROPion (WELLBUTRIN SR) 150 MG 12 hr tablet    Sig: 1 po BID    Dispense:  60 tablet    Refill:  0   Choline Fenofibrate (FENOFIBRIC ACID) 45 MG CPDR    Sig: Take 1 tablet by mouth daily.    Dispense:  30 capsule    Refill:  0   metFORMIN (GLUCOPHAGE) 500 MG tablet    Sig: Take 1 tablet (500 mg total) by mouth 2 (two) times daily with a meal.    Dispense:  60 tablet    Refill:  0   Vitamin D, Ergocalciferol, (DRISDOL) 1.25 MG (50000 UNIT) CAPS capsule    Sig: Take 1 capsule (50,000 Units total) by mouth every 7 (seven) days.    Dispense:  4 capsule    Refill:  0     1. Other depression with emotional eating Cameron Valenzuela will increase Wellbutrin to BID. Behavior modification techniques were discussed today to help Cameron Valenzuela deal with his emotional/non-hunger eating behaviors.  Orders and follow up as documented in patient record.   Increase and Refill- buPROPion (WELLBUTRIN SR) 150 MG 12 hr tablet; 1 po BID  Dispense: 60 tablet; Refill: 0  2. Other hyperlipidemia Cardiovascular risk and specific lipid/LDL goals reviewed.  We discussed several lifestyle modifications today and Cameron Valenzuela will continue to work on diet, exercise and weight loss efforts. Orders and follow up as documented in patient record.   Counseling Intensive lifestyle modifications  are the first line treatment for this issue. Dietary changes: Increase soluble fiber. Decrease simple carbohydrates. Exercise changes: Moderate to vigorous-intensity aerobic activity 150 minutes per week if tolerated. Lipid-lowering medications: see documented in medical record.  Refill- Choline Fenofibrate (FENOFIBRIC ACID) 45 MG CPDR; Take 1 tablet by mouth daily.  Dispense: 30 capsule; Refill: 0  3. Pre-diabetes Cameron Valenzuela will continue to work on weight loss, exercise, and decreasing simple carbohydrates to help  decrease the risk of diabetes.   Refill- metFORMIN (GLUCOPHAGE) 500 MG tablet; Take 1 tablet (500 mg total) by mouth 2 (two) times daily with a meal.  Dispense: 60 tablet; Refill: 0  4. Vitamin D deficiency Low Vitamin D level contributes to fatigue and are associated with obesity, breast, and colon cancer. He agrees to continue to take prescription Vitamin D 50,000 IU every week and will follow-up for routine testing of Vitamin D, at least 2-3 times per year to avoid over-replacement.  Refill- Vitamin D, Ergocalciferol, (DRISDOL) 1.25 MG (50000 UNIT) CAPS capsule; Take 1 capsule (50,000 Units total) by mouth every 7 (seven) days.  Dispense: 4 capsule; Refill: 0  5. At risk for side effect of medication Cameron Valenzuela was given approximately 15 minutes of drug side effect counseling today.  We discussed side effect possibility and risk versus benefits. Cameron Valenzuela agreed to the medication and will contact this office if these side effects are intolerable.  Repetitive spaced learning was employed today to elicit superior memory formation and behavioral change.   6. Obesity with current BMI of 34.8  Cameron Valenzuela is currently in the action stage of change. As such, his goal is to continue with weight loss efforts. He has agreed to the Category 4 Plan and keeping a food journal and adhering to recommended goals of 550-700 calories and 50 grams protein with supper.   Pt's goals: 6 bottles of water; walk/bike 1 mile 20-30 minutes QD. Come 30 minutes prior to next OV for IC and possible fasting blood work if Dr. Jearld Shines feels he needs it.  Exercise goals:  As is.  Behavioral modification strategies: increasing water intake, decreasing liquid calories, and planning for success.  Cameron Valenzuela has agreed to follow-up with our clinic in 4 weeks with Dr. Jearld Shines. He was informed of the importance of frequent follow-up visits to maximize his success with intensive lifestyle modifications for his multiple health conditions.    Objective:   Blood pressure 123/75, pulse 75, temperature (!) 97.4 F (36.3 C), height '5\' 10"'$  (1.778 m), weight 242 lb (109.8 kg), SpO2 98 %. Body mass index is 34.72 kg/m.  General: Cooperative, alert, well developed, in no acute distress. HEENT: Conjunctivae and lids unremarkable. Cardiovascular: Regular rhythm.  Lungs: Normal work of breathing. Neurologic: No focal deficits.   Lab Results  Component Value Date   CREATININE 0.93 05/10/2020   BUN 20 05/10/2020   NA 141 05/10/2020   K 4.4 05/10/2020   CL 103 05/10/2020   CO2 25 05/10/2020   Lab Results  Component Value Date   ALT 33 05/10/2020   AST 22 05/10/2020   ALKPHOS 68 05/10/2020   BILITOT 0.4 05/10/2020   Lab Results  Component Value Date   HGBA1C 5.4 05/10/2020   HGBA1C 5.2 12/24/2019   HGBA1C 5.1 07/27/2019   HGBA1C 5.0 02/11/2019   HGBA1C 6.2 (H) 09/18/2018   Lab Results  Component Value Date   INSULIN 25.0 (H) 05/10/2020   INSULIN 18.7 12/24/2019   INSULIN 22.7 07/27/2019   INSULIN 16.4 02/11/2019   INSULIN  60.2 (H) 09/18/2018   Lab Results  Component Value Date   TSH 3.31 01/27/2021   Lab Results  Component Value Date   CHOL 218 (H) 05/10/2020   HDL 30 (L) 05/10/2020   LDLCALC 127 (H) 05/10/2020   LDLDIRECT 116.0 06/24/2018   TRIG 344 (H) 05/10/2020   CHOLHDL 8 06/24/2018   Lab Results  Component Value Date   VD25OH 38.5 05/10/2020   VD25OH 44.3 12/24/2019   VD25OH 20.8 (L) 07/27/2019   Lab Results  Component Value Date   WBC 5.3 08/12/2020   HGB 14.3 08/12/2020   HCT 42.5 08/12/2020   MCV 91.3 08/12/2020   PLT 165.0 08/12/2020    Attestation Statements:   Reviewed by clinician on day of visit: allergies, medications, problem list, medical history, surgical history, family history, social history, and previous encounter notes.  Coral Ceo, CMA, am acting as transcriptionist for [employer], DO.  I have reviewed the above documentation for accuracy and  completeness, and I agree with the above. Marjory Sneddon, D.O.  The [place] was signed into law in 2016 which includes the topic of electronic health records.  This provides immediate access to information in MyChart.  This includes consultation notes, operative notes, office notes, lab results and pathology reports.  If you have any questions about what you read please let us know at your next visit so we can discuss your concerns and take corrective action if need be.  We are right here with you.

## 2021-04-27 ENCOUNTER — Other Ambulatory Visit (INDEPENDENT_AMBULATORY_CARE_PROVIDER_SITE_OTHER): Payer: Self-pay | Admitting: Family Medicine

## 2021-04-27 DIAGNOSIS — E7849 Other hyperlipidemia: Secondary | ICD-10-CM

## 2021-04-27 DIAGNOSIS — F3289 Other specified depressive episodes: Secondary | ICD-10-CM

## 2021-04-27 DIAGNOSIS — R7303 Prediabetes: Secondary | ICD-10-CM

## 2021-05-01 ENCOUNTER — Ambulatory Visit (INDEPENDENT_AMBULATORY_CARE_PROVIDER_SITE_OTHER): Payer: 59 | Admitting: Family Medicine

## 2021-05-02 ENCOUNTER — Ambulatory Visit (INDEPENDENT_AMBULATORY_CARE_PROVIDER_SITE_OTHER): Payer: 59 | Admitting: Family Medicine

## 2021-05-17 ENCOUNTER — Other Ambulatory Visit: Payer: Self-pay

## 2021-05-17 ENCOUNTER — Ambulatory Visit: Payer: Self-pay | Admitting: Genetic Counselor

## 2021-05-17 ENCOUNTER — Telehealth: Payer: Self-pay | Admitting: Genetic Counselor

## 2021-05-17 ENCOUNTER — Encounter (INDEPENDENT_AMBULATORY_CARE_PROVIDER_SITE_OTHER): Payer: Self-pay | Admitting: Family Medicine

## 2021-05-17 ENCOUNTER — Ambulatory Visit (INDEPENDENT_AMBULATORY_CARE_PROVIDER_SITE_OTHER): Payer: 59 | Admitting: Family Medicine

## 2021-05-17 VITALS — BP 134/80 | HR 75 | Temp 97.7°F | Ht 70.0 in | Wt 247.0 lb

## 2021-05-17 DIAGNOSIS — E559 Vitamin D deficiency, unspecified: Secondary | ICD-10-CM | POA: Diagnosis not present

## 2021-05-17 DIAGNOSIS — F3289 Other specified depressive episodes: Secondary | ICD-10-CM | POA: Diagnosis not present

## 2021-05-17 DIAGNOSIS — Z9189 Other specified personal risk factors, not elsewhere classified: Secondary | ICD-10-CM | POA: Diagnosis not present

## 2021-05-17 DIAGNOSIS — Z6836 Body mass index (BMI) 36.0-36.9, adult: Secondary | ICD-10-CM

## 2021-05-17 DIAGNOSIS — R7303 Prediabetes: Secondary | ICD-10-CM | POA: Diagnosis not present

## 2021-05-17 DIAGNOSIS — R0602 Shortness of breath: Secondary | ICD-10-CM

## 2021-05-17 DIAGNOSIS — Z1379 Encounter for other screening for genetic and chromosomal anomalies: Secondary | ICD-10-CM

## 2021-05-17 MED ORDER — VITAMIN D (ERGOCALCIFEROL) 1.25 MG (50000 UNIT) PO CAPS: 50000.0000 [IU] | ORAL_CAPSULE | ORAL | 0 refills | Status: DC

## 2021-05-17 MED ORDER — TIRZEPATIDE 2.5 MG/0.5ML ~~LOC~~ SOAJ: 2.5000 mg | SUBCUTANEOUS | 0 refills | Status: DC

## 2021-05-17 MED ORDER — BUPROPION HCL ER (SR) 150 MG PO TB12: ORAL_TABLET | ORAL | 0 refills | Status: DC

## 2021-05-17 MED ORDER — METFORMIN HCL 500 MG PO TABS: 500.0000 mg | ORAL_TABLET | Freq: Two times a day (BID) | ORAL | 0 refills | Status: DC

## 2021-05-17 NOTE — Telephone Encounter (Signed)
Revealed negative genetic testing and MSH6 VUS identified.  Discussed that we do not know why there is cancer in the family. It could be due to a different gene that we are not testing, or maybe our current technology may not be able to pick something up.  It will be important for Cameron Valenzuela to keep in contact with genetics to keep up with whether additional testing may be needed.

## 2021-05-17 NOTE — Progress Notes (Signed)
Chief Complaint:   OBESITY Cameron Valenzuela is here to discuss his progress with his obesity treatment plan along with follow-up of his obesity related diagnoses. Cameron Valenzuela is on the Category 4 Plan and keeping a food journal and adhering to recommended goals of 550-700 calories and 50+ grams protein and states he is following his eating plan approximately 50% of the time. Cameron Valenzuela states he is doing 12,000 steps 5 times per week.  Today's visit was #: 26 Starting weight: 256 lbs Starting date: 09/18/2018 Today's weight: 247 lbs Today's date: 05/17/2021 Total lbs lost to date: 9 Total lbs lost since last in-office visit: 0  Interim History: This is pt's first RTC since 04/03/21. 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 He has been under a significant amount of stress. He does have some stress at work over the next few weeks. Pt has eaten out a bit more. Wife and pt have decided to commit more to plan.  Subjective:   1. SOB (shortness of breath) Cameron Valenzuela has a slight increase in RMR since his initial appt. Initial appt IC 2717.  2. Other depression with emotional eating Pt denies suicidal or homicidal ideations. He is doing well on Wellbutrin.  3. Pre-diabetes Cameron Valenzuela is on Metformin with some better control.  4. Vitamin D deficiency Pt denies nausea, vomiting, and muscle weakness but notes fatigue. He is on prescription Vit D.  5. At risk for side effect of medication Cameron Valenzuela is at risk for side effects of medication due to starting [facility]  Assessment/Plan:   1. SOB (shortness of breath) [name] does feel that he gets out of breath more easily that he used to when he exercises. [name]'s shortness of breath appears to be obesity related and exercise induced. He has agreed to work on weight loss and gradually increase exercise to treat his exercise induced shortness of breath. Will continue to monitor closely. IC today.  2. Other depression with emotional  eating Behavior modification techniques were discussed today to help Cameron Valenzuela deal with his emotional/non-hunger eating behaviors.  Orders and follow up as documented in patient record.   Refill- buPROPion (WELLBUTRIN SR) 150 MG 12 hr tablet; 1 po BID  Dispense: 60 tablet; Refill: 0  3. Pre-diabetes Cameron Valenzuela will start Mounjaro 2.5 mg as directed and continue to work on weight loss, exercise, and decreasing simple carbohydrates to help decrease the risk of diabetes. Check labs today.  Refill- metFORMIN (GLUCOPHAGE) 500 MG tablet; Take 1 tablet (500 mg total) by mouth 2 (two) times daily with a meal.  Dispense: 60 tablet; Refill: 0  Start- tirzepatide [facility]) 2.5 MG/0.5ML Pen; Inject 2.5 mg into the skin once a week.  Dispense: 2 mL; Refill: 0  - Comprehensive metabolic panel - Hemoglobin A1c - Insulin, random - Lipid Panel With LDL/HDL Ratio  4. Vitamin D deficiency Low Vitamin D level contributes to fatigue and are associated with obesity, breast, and colon cancer. He agrees to continue to take prescription Vitamin D 50,000 IU every week and will follow-up for routine testing of Vitamin D, at least 2-3 times per year to avoid over-replacement. Check labs today.  Refill- Vitamin D, Ergocalciferol, (DRISDOL) 1.25 MG (50000 UNIT) CAPS capsule; Take 1 capsule (50,000 Units total) by mouth every 7 (seven) days.  Dispense: 4 capsule; Refill: 0  - VITAMIN D 25 Hydroxy (Vit-D Deficiency, Fractures)  5. At risk for side effect of medication Cameron Valenzuela was given approximately 15 minutes of drug side effect counseling  today.  We discussed side effect possibility and risk versus benefits. Eriberto agreed to the medication and will contact this office if these side effects are intolerable.  Repetitive spaced learning was employed today to elicit superior memory formation and behavioral change.   6. Obesity with current BMI of 35.5  [name] is currently in the action stage of change. As such, his goal  is to continue with weight loss efforts. He has agreed to the Category 4 Plan + 200 calories and keeping a food journal and adhering to recommended goals of 2000-2200 calories and 150+ grams protein.   Exercise goals: All adults should avoid inactivity. Some physical activity is better than none, and adults who participate in any amount of physical activity gain some health benefits.  Behavioral modification strategies: planning for success.  Cameron Valenzuela has agreed to follow-up with our clinic in 3 weeks. He was informed of the importance of frequent follow-up visits to maximize his success with intensive lifestyle modifications for his multiple health conditions.   Cameron Valenzuela was informed we would discuss his lab results at his next visit unless there is a critical issue that needs to be addressed sooner. Cameron Valenzuela agreed to keep his next visit at the agreed upon time to discuss these results.  Objective:   Blood pressure 134/80, pulse 75, temperature 97.7 F (36.5 C), height 5\' 10"  (1.778 m), weight 247 lb (112 kg), SpO2 100 %. Body mass index is 35.44 kg/m.  General: Cooperative, alert, well developed, in no acute distress. HEENT: Conjunctivae and lids unremarkable. Cardiovascular: Regular rhythm.  Lungs: Normal work of breathing. Neurologic: No focal deficits.   Lab Results  Component Value Date   CREATININE 0.93 05/10/2020   BUN 20 05/10/2020   NA 141 05/10/2020   K 4.4 05/10/2020   CL 103 05/10/2020   CO2 25 05/10/2020   Lab Results  Component Value Date   ALT 33 05/10/2020   AST 22 05/10/2020   ALKPHOS 68 05/10/2020   BILITOT 0.4 05/10/2020   Lab Results  Component Value Date   HGBA1C 5.4 05/10/2020   HGBA1C 5.2 12/24/2019   HGBA1C 5.1 07/27/2019   HGBA1C 5.0 02/11/2019   HGBA1C 6.2 (H) 09/18/2018   Lab Results  Component Value Date   INSULIN 25.0 (H) 05/10/2020   INSULIN 18.7 12/24/2019   INSULIN 22.7 07/27/2019   INSULIN 16.4 02/11/2019   INSULIN 60.2 (H)  09/18/2018   Lab Results  Component Value Date   TSH 3.31 01/27/2021   Lab Results  Component Value Date   CHOL 218 (H) 05/10/2020   HDL 30 (L) 05/10/2020   LDLCALC 127 (H) 05/10/2020   LDLDIRECT 116.0 06/24/2018   TRIG 344 (H) 05/10/2020   CHOLHDL 8 06/24/2018   Lab Results  Component Value Date   VD25OH 38.5 05/10/2020   VD25OH 44.3 12/24/2019   VD25OH 20.8 (L) 07/27/2019   Lab Results  Component Value Date   WBC 5.3 08/12/2020   HGB 14.3 08/12/2020   HCT 42.5 08/12/2020   MCV 91.3 08/12/2020   PLT 165.0 08/12/2020    Attestation Statements:   Reviewed by clinician on day of visit: allergies, medications, problem list, medical history, surgical history, family history, social history, and previous encounter notes.  Coral Ceo, CMA, am acting as transcriptionist for Coralie Common, MD.  I have reviewed the above documentation for accuracy and completeness, and I agree with the above. - Coralie Common, MD

## 2021-05-17 NOTE — Progress Notes (Signed)
HPI:  [name] was previously seen in the [place] clinic due to a family history of breast cancer and concerns regarding a hereditary predisposition to cancer. Please refer to our prior cancer genetics clinic note for more information regarding our discussion, assessment and recommendations, at the time. [name] recent genetic test results were disclosed to him, as were recommendations warranted by these results. These results and recommendations are discussed in more detail below.  CANCER HISTORY:  Oncology History   No history exists.    FAMILY HISTORY:  We obtained a detailed, 4-generation family history.  Significant diagnoses are listed below: Family History  Problem Relation Age of Onset   Hyperlipidemia Mother        M and others   Cancer Mother    Breast cancer Mother        dx early 52s   Diabetes Father    COPD Father    Heart disease Father        ?   Stroke Father    Sleep apnea Father    Obesity Father    Ovarian cancer Maternal Grandmother 25   Heart disease Maternal Grandfather    Heart disease Paternal Grandmother    Diabetes Other        many fam members    Colon cancer Neg Hx    Prostate cancer Neg Hx    Colon polyps Neg Hx    Esophageal cancer Neg Hx    Rectal cancer Neg Hx    Stomach cancer Neg Hx     The patient has one daughter who is cancer free.  He has a brother and sister who are cancer free.  Both parents are living.   The patient's father is in his 41's.  He is one of 7 kids, none who have cancer.  His parents are deceased from non-cancer related issues.   The patient's mother had breast cancer in her early 53's.  She has two sisters and three brothers, none had cancer.  His grandmother had ovarian cancer at 62.     [name] is unaware of previous family history of genetic testing for hereditary cancer risks. Patient's maternal ancestors are of [place] descent, and paternal ancestors are of [place] descent. There  is no reported Ashkenazi Jewish ancestry. There is no known consanguinity.    GENETIC TEST RESULTS: Genetic testing reported out on March 09, 2021 through the CancerNext-Expanded+RNAinsight cancer panel found no pathogenic mutations. The CancerNext-Expanded gene panel offered by [facility] and includes sequencing and rearrangement analysis for the following 77 genes: AIP, ALK, APC*, ATM*, AXIN2, BAP1, BARD1, BLM, BMPR1A, BRCA1*, BRCA2*, BRIP1*, CDC73, CDH1*, CDK4, CDKN1B, CDKN2A, CHEK2*, CTNNA1, DICER1, FANCC, FH, FLCN, GALNT12, KIF1B, LZTR1, MAX, MEN1, MET, MLH1*, MSH2*, MSH3, MSH6*, MUTYH*, NBN, NF1*, NF2, NTHL1, PALB2*, PHOX2B, PMS2*, POT1, PRKAR1A, PTCH1, PTEN*, RAD51C*, RAD51D*, RB1, RECQL, RET, SDHA, SDHAF2, SDHB, SDHC, SDHD, SMAD4, SMARCA4, SMARCB1, SMARCE1, STK11, SUFU, TMEM127, TP53*, TSC1, TSC2, VHL and XRCC2 (sequencing and deletion/duplication); EGFR, EGLN1, HOXB13, KIT, MITF, PDGFRA, POLD1, and POLE (sequencing only); EPCAM and GREM1 (deletion/duplication only). DNA and RNA analyses performed for * genes. The test report has been scanned into EPIC and is located under the Molecular Pathology section of the Results Review tab.  A portion of the result report is included below for reference.     We discussed with [name] that because current genetic testing is not perfect, it is possible there may be a gene mutation in one of  these genes that current testing cannot detect, but that chance is small.  We also discussed, that there could be another gene that has not yet been discovered, or that we have not yet tested, that is responsible for the cancer diagnoses in the family. It is also possible there is a hereditary cause for the cancer in the family that [name] did not inherit and therefore was not identified in his testing.  Therefore, it is important to remain in touch with cancer genetics in the future so that we can continue to offer [name] the most up to date genetic  testing.   Genetic testing did identify a variant of uncertain significance (VUS) was identified in the MSH6 gene called [id number].  At this time, it is unknown if this variant is associated with increased cancer risk or if this is a normal finding, but most variants such as this get reclassified to being inconsequential. It should not be used to make medical management decisions. With time, we suspect the lab will determine the significance of this variant, if any. If we do learn more about it, we will try to contact [name] to discuss it further. However, it is important to stay in touch with [place] periodically and keep the address and phone number up to date.  ADDITIONAL GENETIC TESTING: We discussed with [name] that his genetic testing was fairly extensive.  If there are genes identified to increase cancer risk that can be analyzed in the future, we would be happy to discuss and coordinate this testing at that time.    CANCER SCREENING RECOMMENDATIONS: [name] test result is considered negative (normal).  This means that we have not identified a hereditary cause for his family history of breast cancer at this time. Most cancers happen by chance and this negative test suggests that his cancer may fall into this category.    While reassuring, this does not definitively rule out a hereditary predisposition to cancer. It is still possible that there could be genetic mutations that are undetectable by current technology. There could be genetic mutations in genes that have not been tested or identified to increase cancer risk.  Therefore, it is recommended he continue to follow the cancer management and screening guidelines provided by his primary healthcare provider.   An individual's cancer risk and medical management are not determined by genetic test results alone. Overall cancer risk assessment incorporates additional factors, including personal medical history, family history, and any  available genetic information that may result in a personalized plan for cancer prevention and surveillance  RECOMMENDATIONS FOR FAMILY MEMBERS:  Individuals in this family might be at some increased risk of developing cancer, over the general population risk, simply due to the family history of cancer.  We recommended women in this family have a yearly mammogram beginning at age 38, or 29 years younger than the earliest onset of cancer, an annual clinical breast exam, and perform monthly breast self-exams. Women in this family should also have a gynecological exam as recommended by their primary provider. All family members should be referred for colonoscopy starting at age 27.  FOLLOW-UP: Lastly, we discussed with [name] that cancer genetics is a rapidly advancing field and it is possible that new genetic tests will be appropriate for him and/or his family members in the future. We encouraged him to remain in contact with cancer genetics on an annual basis so we can update his personal and family histories and let him know  of advances in cancer genetics that may benefit this family.   Our contact number was provided. [name]'s questions were answered to his satisfaction, and he knows he is welcome to call us at anytime with additional questions or concerns.   Roma Kayser, [place], [facility] Licensed, Certified Genetic Counselor [email or web address]

## 2021-05-19 LAB — COMPREHENSIVE METABOLIC PANEL
ALT: 71 IU/L — ABNORMAL HIGH (ref 0–44)
AST: 50 IU/L — ABNORMAL HIGH (ref 0–40)
Albumin/Globulin Ratio: 2.4 — ABNORMAL HIGH (ref 1.2–2.2)
Albumin: 4.7 g/dL (ref 3.8–4.9)
Alkaline Phosphatase: 78 IU/L (ref 44–121)
BUN/Creatinine Ratio: 16 (ref 9–20)
BUN: 15 mg/dL (ref 6–24)
Bilirubin Total: 0.3 mg/dL (ref 0.0–1.2)
CO2: 23 mmol/L (ref 20–29)
Calcium: 9.4 mg/dL (ref 8.7–10.2)
Chloride: 105 mmol/L (ref 96–106)
Creatinine, Ser: 0.96 mg/dL (ref 0.76–1.27)
Globulin, Total: 2 g/dL (ref 1.5–4.5)
Glucose: 105 mg/dL — ABNORMAL HIGH (ref 70–99)
Potassium: 4.7 mmol/L (ref 3.5–5.2)
Sodium: 142 mmol/L (ref 134–144)
Total Protein: 6.7 g/dL (ref 6.0–8.5)
eGFR: 96 mL/min/{1.73_m2} (ref >59)

## 2021-05-19 LAB — LIPID PANEL WITH LDL/HDL RATIO
Cholesterol, Total: 219 mg/dL — ABNORMAL HIGH (ref 100–199)
HDL: 29 mg/dL — ABNORMAL LOW (ref >39)
LDL Chol Calc (NIH): 146 mg/dL — ABNORMAL HIGH (ref 0–99)
LDL/HDL Ratio: 5 ratio — ABNORMAL HIGH (ref 0.0–3.6)
Triglycerides: 238 mg/dL — ABNORMAL HIGH (ref 0–149)
VLDL Cholesterol Cal: 44 mg/dL — ABNORMAL HIGH (ref 5–40)

## 2021-05-19 LAB — INSULIN, RANDOM: INSULIN: 45.5 u[IU]/mL — ABNORMAL HIGH (ref 2.6–24.9)

## 2021-05-19 LAB — VITAMIN D 25 HYDROXY (VIT D DEFICIENCY, FRACTURES): Vit D, 25-Hydroxy: 25.5 ng/mL — ABNORMAL LOW (ref 30.0–100.0)

## 2021-05-19 LAB — HEMOGLOBIN A1C
Est. average glucose Bld gHb Est-mCnc: 114 mg/dL
Hgb A1c MFr Bld: 5.6 % (ref 4.8–5.6)

## 2021-05-27 IMAGING — DX DG CHEST 2V
2 series · 2 of 2 positions shown · non-contrast
Comparison: None.

CLINICAL DATA: History of T3HX7-5S positivity in Wednesday November, 2018 with
persistent chest pain, initial encounter

EXAM:
CHEST - 2 VIEW

[chest pa]
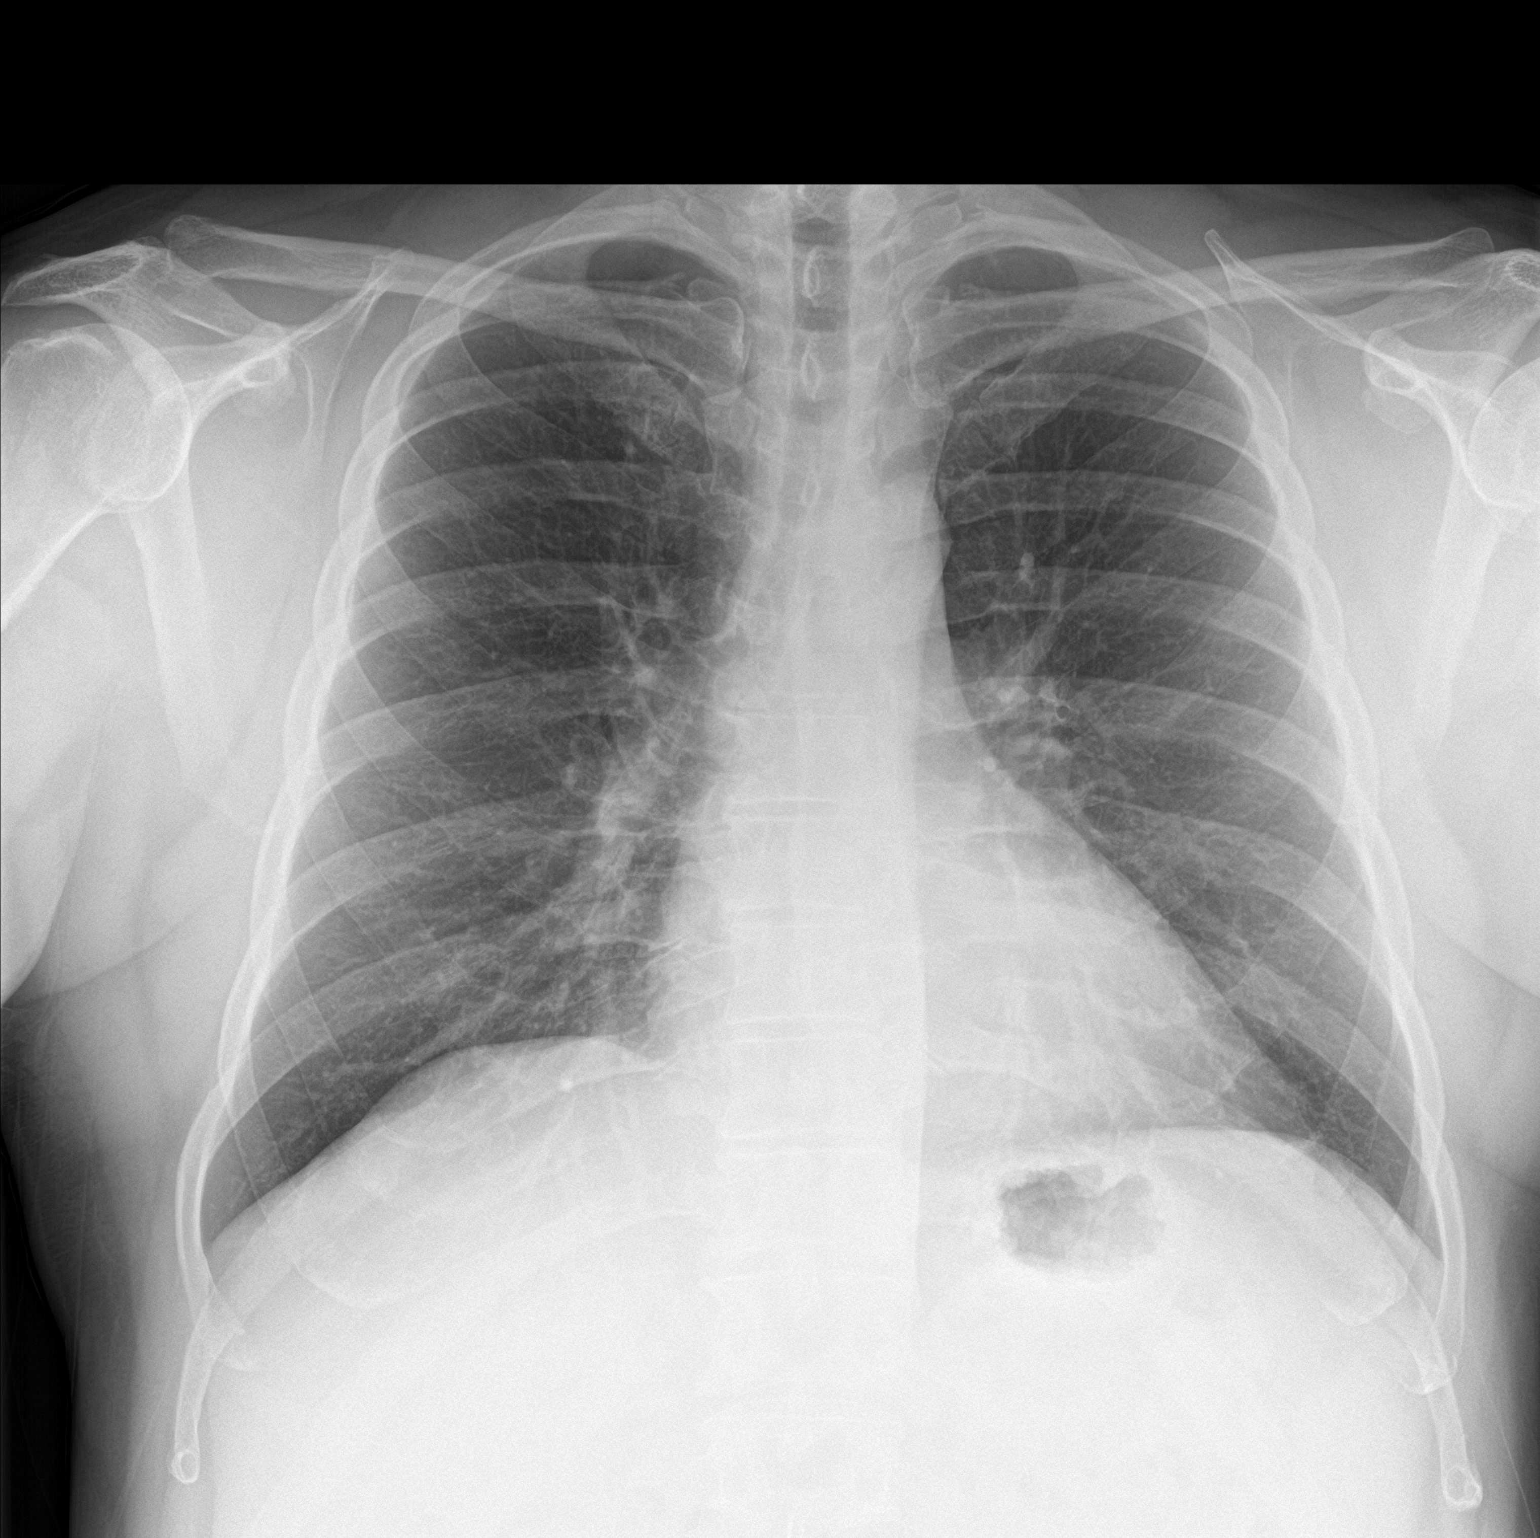

[chest lat]
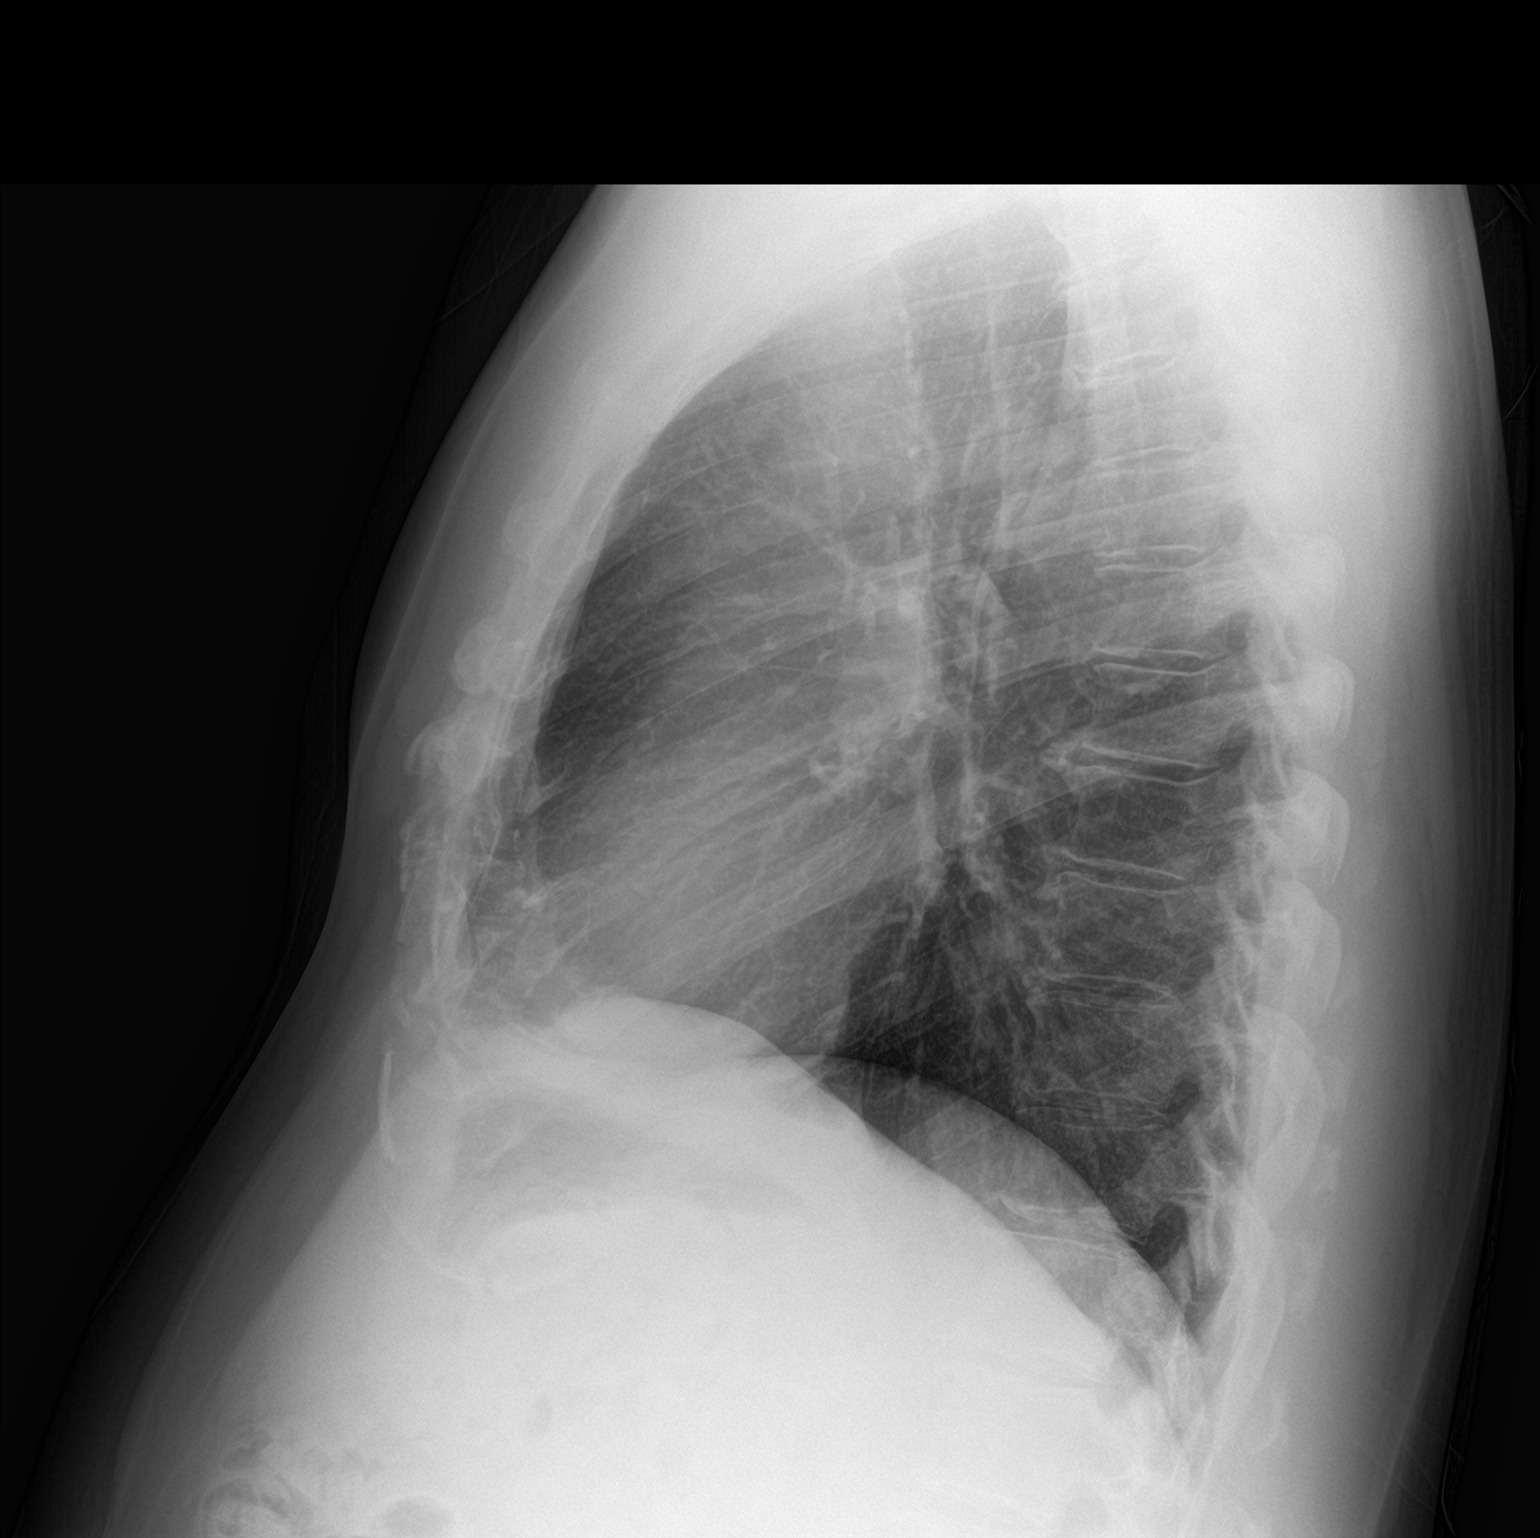

[2 of 2 positions shown; findings below may reference images not displayed]

FINDINGS: The heart size and mediastinal contours are within normal limits.
Both lungs are clear. The visualized skeletal structures are
unremarkable.
IMPRESSION: No active cardiopulmonary disease.

## 2021-06-06 ENCOUNTER — Encounter (INDEPENDENT_AMBULATORY_CARE_PROVIDER_SITE_OTHER): Payer: Self-pay | Admitting: Family Medicine

## 2021-06-06 ENCOUNTER — Ambulatory Visit (INDEPENDENT_AMBULATORY_CARE_PROVIDER_SITE_OTHER): Payer: 59 | Admitting: Family Medicine

## 2021-06-06 ENCOUNTER — Other Ambulatory Visit: Payer: Self-pay

## 2021-06-06 VITALS — BP 129/78 | HR 84 | Temp 98.1°F | Ht 70.0 in | Wt 244.0 lb

## 2021-06-06 DIAGNOSIS — R748 Abnormal levels of other serum enzymes: Secondary | ICD-10-CM | POA: Diagnosis not present

## 2021-06-06 DIAGNOSIS — Z6836 Body mass index (BMI) 36.0-36.9, adult: Secondary | ICD-10-CM

## 2021-06-06 DIAGNOSIS — R7303 Prediabetes: Secondary | ICD-10-CM

## 2021-06-06 DIAGNOSIS — F3289 Other specified depressive episodes: Secondary | ICD-10-CM

## 2021-06-06 DIAGNOSIS — E785 Hyperlipidemia, unspecified: Secondary | ICD-10-CM

## 2021-06-06 DIAGNOSIS — E559 Vitamin D deficiency, unspecified: Secondary | ICD-10-CM

## 2021-06-06 MED ORDER — BUPROPION HCL ER (SR) 150 MG PO TB12: ORAL_TABLET | ORAL | 0 refills | Status: DC

## 2021-06-06 MED ORDER — VITAMIN D (ERGOCALCIFEROL) 1.25 MG (50000 UNIT) PO CAPS: 50000.0000 [IU] | ORAL_CAPSULE | ORAL | 0 refills | Status: DC

## 2021-06-06 MED ORDER — FENOFIBRIC ACID 45 MG PO CPDR: 1.0000 | DELAYED_RELEASE_CAPSULE | Freq: Every day | ORAL | 0 refills | Status: DC

## 2021-06-13 ENCOUNTER — Ambulatory Visit (INDEPENDENT_AMBULATORY_CARE_PROVIDER_SITE_OTHER): Payer: 59 | Admitting: Family Medicine

## 2021-06-13 NOTE — Progress Notes (Addendum)
Chief Complaint:   OBESITY Cameron Valenzuela is here to discuss his progress with his obesity treatment plan along with follow-up of his obesity related diagnoses. Cameron Valenzuela is on the Category 4 Plan + 200 calories and keeping a food journal and adhering to recommended goals of 2000-2200 calories and 150 grams protein and states he is following his eating plan approximately 50% of the time. Cameron Valenzuela states he is walking 1 mile 3-4 times per week.  Today's visit was #: 14 Starting weight: 256 lbs Starting date: 09/18/2018 Today's weight: 244 lbs Today's date: 06/06/2021 Total lbs lost to date: 12 Total lbs lost since last in-office visit: 3  Interim History: Cameron Valenzuela is the only one in the family on a diet. His wife cooking supper is the hardest meal, food shopping, and planning meals. He is walking a little more. Pt notes stress at work, as he is having to take on more jobs due to less staff.  Subjective:   1. Vitamin D deficiency Discussed labs with patient today. Cameron Valenzuela has run out of medication. His last Vit D level was 25.5. He denies nausea, vomiting, and muscle weakness.  2. Prediabetes Discussed labs with patient today. Pt is on Metformin and tolerating it well. He has an elevated insulin level of 45.5.  3. Elevated liver enzymes Discussed labs with patient today. Pt's ALT is 71 and AST 50. He denies abdominal pain, nausea, or vomiting. He has no history of hepatitis. ALT was previously elevated 2 years ago at 33. 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  4. Dyslipidemia Discussed labs with patient today. Cameron Valenzuela has a family history of hypertriglyceridemia. He is not on statin therapy. He is currently on Fenofibrate acid. Goal for LDL is <100 and HDL >50.  5. Other depression with emotional eating Pt denies suicidal or homicidal ideations. Cameron Valenzuela is on bupropion. He reports good control os symptoms with recent increase in stress due to high demands at work.  Assessment/Plan:   1. Vitamin D  deficiency Low Vitamin D level contributes to fatigue and are associated with obesity, breast, and colon cancer. He agrees to continue to take prescription Vitamin D 50,000 IU every week and will follow-up for routine testing of Vitamin D, at least 2-3 times per year to avoid over-replacement.  Refill- Vitamin D, Ergocalciferol, (DRISDOL) 1.25 MG (50000 UNIT) CAPS capsule; Take 1 capsule (50,000 Units total) by mouth every 7 (seven) days.  Dispense: 4 capsule; Refill: 0  2. Prediabetes Cameron Valenzuela will continue to work on weight loss, exercise, and decreasing simple carbohydrates to help decrease the risk of diabetes. Continue current treatment plan. Repeat labs in 3 months.  3. Elevated liver enzymes Repeat LFT's in 3 months. Will add Hepatitis C at next visit.  4. Dyslipidemia Cardiovascular risk and specific lipid/LDL goals reviewed.  We discussed several lifestyle modifications today and Cameron Valenzuela will continue to work on diet, exercise and weight loss efforts. Orders and follow up as documented in patient record. Decrease saturated fat intake. Pt will likely need to increase cardio to increase HDL when appropriate to incorporate.  Counseling Intensive lifestyle modifications are the first line treatment for this issue. Dietary changes: Increase soluble fiber. Decrease simple carbohydrates. Exercise changes: Moderate to vigorous-intensity aerobic activity 150 minutes per week if tolerated. Lipid-lowering medications: see documented in medical record.  Refill- Choline Fenofibrate (FENOFIBRIC ACID) 45 MG CPDR; Take 1 tablet by mouth daily.  Dispense: 30 capsule; Refill: 0  5. Other depression with emotional eating Behavior modification techniques were discussed today to  help Cameron Valenzuela deal with his emotional/non-hunger eating behaviors.  Orders and follow up as documented in patient record.   Refill- buPROPion (WELLBUTRIN SR) 150 MG 12 hr tablet; 1 po BID  Dispense: 60 tablet; Refill: 0  6.  Obesity with current BMI of 35.1  Cameron Valenzuela is currently in the action stage of change. As such, his goal is to continue with weight loss efforts. He has agreed to the Category 4 Plan.   Exercise goals: No exercise has been prescribed at this time.  Behavioral modification strategies: meal planning and cooking strategies, ways to avoid night time snacking, and planning for success.  Cameron Valenzuela has agreed to follow-up with our clinic in 3-4 weeks. He was informed of the importance of frequent follow-up visits to maximize his success with intensive lifestyle modifications for his multiple health conditions.   Objective:   Blood pressure 129/78, pulse 84, temperature 98.1 F (36.7 C), height 5\' 10"  (1.778 m), weight 244 lb (110.7 kg), SpO2 97 %. Body mass index is 35.01 kg/m.  General: Cooperative, alert, well developed, in no acute distress. HEENT: Conjunctivae and lids unremarkable. Cardiovascular: Regular rhythm.  Lungs: Normal work of breathing. Neurologic: No focal deficits.   Lab Results  Component Value Date   CREATININE 0.96 05/17/2021   BUN 15 05/17/2021   NA 142 05/17/2021   K 4.7 05/17/2021   CL 105 05/17/2021   CO2 23 05/17/2021   Lab Results  Component Value Date   ALT 71 (H) 05/17/2021   AST 50 (H) 05/17/2021   ALKPHOS 78 05/17/2021   BILITOT 0.3 05/17/2021   Lab Results  Component Value Date   HGBA1C 5.6 05/17/2021   HGBA1C 5.4 05/10/2020   HGBA1C 5.2 12/24/2019   HGBA1C 5.1 07/27/2019   HGBA1C 5.0 02/11/2019   Lab Results  Component Value Date   INSULIN 45.5 (H) 05/17/2021   INSULIN 25.0 (H) 05/10/2020   INSULIN 18.7 12/24/2019   INSULIN 22.7 07/27/2019   INSULIN 16.4 02/11/2019   Lab Results  Component Value Date   TSH 3.31 01/27/2021   Lab Results  Component Value Date   CHOL 219 (H) 05/17/2021   HDL 29 (L) 05/17/2021   LDLCALC 146 (H) 05/17/2021   LDLDIRECT 116.0 06/24/2018   TRIG 238 (H) 05/17/2021   CHOLHDL 8 06/24/2018   Lab Results   Component Value Date   VD25OH 25.5 (L) 05/17/2021   VD25OH 38.5 05/10/2020   VD25OH 44.3 12/24/2019   Lab Results  Component Value Date   WBC 5.3 08/12/2020   HGB 14.3 08/12/2020   HCT 42.5 08/12/2020   MCV 91.3 08/12/2020   PLT 165.0 08/12/2020    Attestation Statements:   Reviewed by clinician on day of visit: allergies, medications, problem list, medical history, surgical history, family history, social history, and previous encounter notes.  Coral Ceo, CMA, am acting as transcriptionist for Coralie Common, MD.  I supervised resident physician Carollee Leitz during entirety of this visit and agree with her assessment and plan. I have reviewed the above documentation for accuracy and completeness, and I agree with the above. - Coralie Common, MD

## 2021-06-19 ENCOUNTER — Other Ambulatory Visit (INDEPENDENT_AMBULATORY_CARE_PROVIDER_SITE_OTHER): Payer: Self-pay | Admitting: Family Medicine

## 2021-06-19 ENCOUNTER — Encounter (INDEPENDENT_AMBULATORY_CARE_PROVIDER_SITE_OTHER): Payer: Self-pay

## 2021-06-19 DIAGNOSIS — R7303 Prediabetes: Secondary | ICD-10-CM

## 2021-06-19 NOTE — Telephone Encounter (Signed)
Pt last seen by Dr. Ukleja.  

## 2021-06-19 NOTE — Telephone Encounter (Signed)
MyChart message sent to pt to find out if they have enough medication to get them through until next appt.   

## 2021-06-26 ENCOUNTER — Encounter (INDEPENDENT_AMBULATORY_CARE_PROVIDER_SITE_OTHER): Payer: Self-pay

## 2021-06-28 ENCOUNTER — Ambulatory Visit (INDEPENDENT_AMBULATORY_CARE_PROVIDER_SITE_OTHER): Payer: 59 | Admitting: Family Medicine

## 2021-07-07 ENCOUNTER — Telehealth (INDEPENDENT_AMBULATORY_CARE_PROVIDER_SITE_OTHER): Payer: Self-pay | Admitting: Family Medicine

## 2021-07-07 DIAGNOSIS — F3289 Other specified depressive episodes: Secondary | ICD-10-CM

## 2021-07-08 ENCOUNTER — Other Ambulatory Visit (INDEPENDENT_AMBULATORY_CARE_PROVIDER_SITE_OTHER): Payer: Self-pay | Admitting: Family Medicine

## 2021-07-08 DIAGNOSIS — E785 Hyperlipidemia, unspecified: Secondary | ICD-10-CM

## 2021-07-11 NOTE — Telephone Encounter (Signed)
Please send refills to PCP

## 2021-07-11 NOTE — Telephone Encounter (Signed)
Please send medications to PCP

## 2021-07-12 NOTE — Telephone Encounter (Signed)
Dr Jearld Shines,   I have received a request for refill of medications for patient.  I have seen him once in your clinic during my elective. If you are continuing to manage his chronic conditions could you please refill as I cannot and evidently PCP is not following for this.    Thank you

## 2021-07-12 NOTE — Telephone Encounter (Signed)
Please advise- I don't see that you have ever written this prescription for Pt.

## 2021-07-12 NOTE — Telephone Encounter (Signed)
I have sent a message to Dr Jearld Shines to request refill.  I was her resident that day.  Refills were not supposed to come to me.  Thank you Lavella Lemons

## 2021-07-12 NOTE — Telephone Encounter (Signed)
Dr. Volanda Napoleon,  Cameron Valenzuela has been seen by you 7 times this year, evidently you are managing his lipids. Recommend to discuss with your attending physician refill prescriptions.

## 2021-07-12 NOTE — Telephone Encounter (Signed)
Please advise- I don't see where you have ever written this prescription for Pt.

## 2021-07-24 ENCOUNTER — Ambulatory Visit (INDEPENDENT_AMBULATORY_CARE_PROVIDER_SITE_OTHER): Payer: 59 | Admitting: Family Medicine

## 2021-07-24 ENCOUNTER — Other Ambulatory Visit: Payer: Self-pay

## 2021-07-24 ENCOUNTER — Encounter (INDEPENDENT_AMBULATORY_CARE_PROVIDER_SITE_OTHER): Payer: Self-pay | Admitting: Family Medicine

## 2021-07-24 VITALS — BP 141/78 | HR 74 | Temp 98.0°F | Ht 70.0 in | Wt 243.0 lb

## 2021-07-24 DIAGNOSIS — E785 Hyperlipidemia, unspecified: Secondary | ICD-10-CM

## 2021-07-24 DIAGNOSIS — Z6835 Body mass index (BMI) 35.0-35.9, adult: Secondary | ICD-10-CM | POA: Diagnosis not present

## 2021-07-24 DIAGNOSIS — E559 Vitamin D deficiency, unspecified: Secondary | ICD-10-CM

## 2021-07-24 MED ORDER — VITAMIN D (ERGOCALCIFEROL) 1.25 MG (50000 UNIT) PO CAPS: 50000.0000 [IU] | ORAL_CAPSULE | ORAL | 0 refills | Status: DC

## 2021-07-24 MED ORDER — FENOFIBRIC ACID 45 MG PO CPDR: 1.0000 | DELAYED_RELEASE_CAPSULE | Freq: Every day | ORAL | 0 refills | Status: DC

## 2021-07-25 NOTE — Progress Notes (Signed)
Chief Complaint:   OBESITY Cameron Valenzuela is here to discuss his progress with his obesity treatment plan along with follow-up of his obesity related diagnoses. Cameron Valenzuela is on the Category 4 Plan and states he is following his eating plan approximately 60% of the time. Cameron Valenzuela states he is walking 30 minutes 2-3 times per week.  Today's visit was #: 45 Starting weight: 256 lbs Starting date: 09/18/2018 Today's weight: 243 lbs Today's date: 07/24/2021 Total lbs lost to date: 13 Total lbs lost since last in-office visit: 1  Interim History: Cameron Valenzuela's daughter is home and just restarted the semester. His daughter will be coming home to go to school. Over the holiday, pt reports he , his wife, and daughter have acknowledged that they need to be more mindful of food choices. He does anticipate Category 4 to be easiest to follow.  Subjective:   1. Dyslipidemia Pt is doing fenofibrate and doing well with it. His has an LDL of 146, HDL 29, and triglycerides 238.  2. Vitamin D deficiency Pt denies nausea, vomiting, and muscle weakness but notes fatigue. He is on prescription Vit D. His last Vit D level was 25.5.  Assessment/Plan:   1. Dyslipidemia Cardiovascular risk and specific lipid/LDL goals reviewed.  We discussed several lifestyle modifications today and Cameron Valenzuela will continue to work on diet, exercise and weight loss efforts. Orders and follow up as documented in patient record.   Counseling Intensive lifestyle modifications are the first line treatment for this issue. Dietary changes: Increase soluble fiber. Decrease simple carbohydrates. Exercise changes: Moderate to vigorous-intensity aerobic activity 150 minutes per week if tolerated. Lipid-lowering medications: see documented in medical record.  Refill- Choline Fenofibrate (FENOFIBRIC ACID) 45 MG CPDR; Take 1 tablet by mouth daily.  Dispense: 30 capsule; Refill: 0  2. Vitamin D deficiency Low Vitamin D level contributes to fatigue  and are associated with obesity, breast, and colon cancer. He agrees to continue to take prescription Vitamin D 50,000 IU every week and will follow-up for routine testing of Vitamin D, at least 2-3 times per year to avoid over-replacement.  Refill- Vitamin D, Ergocalciferol, (DRISDOL) 1.25 MG (50000 UNIT) CAPS capsule; Take 1 capsule (50,000 Units total) by mouth every 7 (seven) days.  Dispense: 12 capsule; Refill: 0  3. Obesity with current BMI of 35.0  Cameron Valenzuela is currently in the action stage of change. As such, his goal is to continue with weight loss efforts. He has agreed to the Category 4 Plan.   Exercise goals: All adults should avoid inactivity. Some physical activity is better than none, and adults who participate in any amount of physical activity gain some health benefits.  Behavioral modification strategies: increasing lean protein intake, meal planning and cooking strategies, keeping healthy foods in the home, and planning for success.  Cameron Valenzuela has agreed to follow-up with our clinic in 3-4 weeks. He was informed of the importance of frequent follow-up visits to maximize his success with intensive lifestyle modifications for his multiple health conditions.   Objective:   Blood pressure (!) 141/78, pulse 74, temperature 98 F (36.7 C), height 5\' 10"  (1.778 m), weight 243 lb (110.2 kg), SpO2 97 %. Body mass index is 34.87 kg/m.  General: Cooperative, alert, well developed, in no acute distress. HEENT: Conjunctivae and lids unremarkable. Cardiovascular: Regular rhythm.  Lungs: Normal work of breathing. Neurologic: No focal deficits.   Lab Results  Component Value Date   CREATININE 0.96 05/17/2021   BUN 15 05/17/2021   NA 142  05/17/2021   K 4.7 05/17/2021   CL 105 05/17/2021   CO2 23 05/17/2021   Lab Results  Component Value Date   ALT 71 (H) 05/17/2021   AST 50 (H) 05/17/2021   ALKPHOS 78 05/17/2021   BILITOT 0.3 05/17/2021   Lab Results  Component Value Date    HGBA1C 5.6 05/17/2021   HGBA1C 5.4 05/10/2020   HGBA1C 5.2 12/24/2019   HGBA1C 5.1 07/27/2019   HGBA1C 5.0 02/11/2019   Lab Results  Component Value Date   INSULIN 45.5 (H) 05/17/2021   INSULIN 25.0 (H) 05/10/2020   INSULIN 18.7 12/24/2019   INSULIN 22.7 07/27/2019   INSULIN 16.4 02/11/2019   Lab Results  Component Value Date   TSH 3.31 01/27/2021   Lab Results  Component Value Date   CHOL 219 (H) 05/17/2021   HDL 29 (L) 05/17/2021   LDLCALC 146 (H) 05/17/2021   LDLDIRECT 116.0 06/24/2018   TRIG 238 (H) 05/17/2021   CHOLHDL 8 06/24/2018   Lab Results  Component Value Date   VD25OH 25.5 (L) 05/17/2021   VD25OH 38.5 05/10/2020   VD25OH 44.3 12/24/2019   Lab Results  Component Value Date   WBC 5.3 08/12/2020   HGB 14.3 08/12/2020   HCT 42.5 08/12/2020   MCV 91.3 08/12/2020   PLT 165.0 08/12/2020    Attestation Statements:   Reviewed by clinician on day of visit: allergies, medications, problem list, medical history, surgical history, family history, social history, and previous encounter notes.  Coral Ceo, CMA, am acting as transcriptionist for Coralie Common, MD.  I have reviewed the above documentation for accuracy and completeness, and I agree with the above. - Coralie Common, MD

## 2021-07-27 ENCOUNTER — Ambulatory Visit (INDEPENDENT_AMBULATORY_CARE_PROVIDER_SITE_OTHER): Payer: 59 | Admitting: Neurology

## 2021-07-27 ENCOUNTER — Other Ambulatory Visit: Payer: Self-pay

## 2021-07-27 VITALS — BP 112/70 | HR 70 | Ht 70.0 in | Wt 249.0 lb

## 2021-07-27 DIAGNOSIS — Z9989 Dependence on other enabling machines and devices: Secondary | ICD-10-CM

## 2021-07-27 DIAGNOSIS — G4733 Obstructive sleep apnea (adult) (pediatric): Secondary | ICD-10-CM | POA: Diagnosis not present

## 2021-07-27 DIAGNOSIS — E6609 Other obesity due to excess calories: Secondary | ICD-10-CM

## 2021-07-27 DIAGNOSIS — Z6830 Body mass index (BMI) 30.0-30.9, adult: Secondary | ICD-10-CM

## 2021-07-27 DIAGNOSIS — E8881 Metabolic syndrome: Secondary | ICD-10-CM

## 2021-07-27 NOTE — Progress Notes (Signed)
CM sent to [facility]

## 2021-07-27 NOTE — Progress Notes (Signed)
[facility]  PATIENT: Cameron Valenzuela DOB: 09-04-1970   REASON FOR VISIT: Follow-up for 2018 diagnosed obstructive sleep apnea with CPAP HISTORY FROM: Patient   INTERVAL HISTORY OF PRESENT ILLNESS:  Cameron Valenzuela:   His CPAP is 52 years old, will be 5 by January 2024. He is a very compliant user, feels that sleep is more restorative and he has better days in regards to focus and endurance.  Epworth sleepiness score is now 3/ 24 points. No fatigue. Vit D and Wellbutrin, metformin, fenofibrate acid.  CPAPCompliance was reviewed.    ROS: Fingers are more stiff in the morning, nerve impingement in the neck affecting the left hand 3 fingers felt numb. Numbness in fingertips affects fine motor.  He used to habve a similar feeling on the left and had rehab/ PT..         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  Cameron Valenzuela has been a highly compliant CPAP user in the past and again there is a 97% compliance user of CPAP was 20 out of 30 days and 80% of these days over 4 hours consecutively.  His average use of CPAP at night is 6 hours 7 minutes he is using an AutoSet air sense 10 by [employer], minimum pressure is 7 maximum pressure 16 cmH2O there is 2 cm expiratory pressure relief.  The serial number is [id number].  His residual AHI was 4.9 /h today which is a little higher than I like and all residual apneas are obstructive in nature based on this I would like to offer to increase the maximum pressure from 16 to 18 cm.   His machine documents that at the 95th percentile he requires 15.5 cm water pressure at night to control his apnea and since his maximum pressure is set at 16 was 2 cm proximal expiratory pressure relief he is straddling very close to that maximum border.  There are also high air leaks which can lead to erroneous apnea counts.  There is no evidence of  Cheyne-Stokes respiration.      Cameron Valenzuela , a 51 year old Caucasian Freight forwarder of a [employer] is seen here on 07-27-2019. Cameron. Valenzuela was infected with coronavirus in May of 2020, but had a mild course.  He continued to use CPAP since June and he felt that CPAP actually may have helped him to overcome the symptoms of COVID-19.  I reviewed his download which shows that he is a very compliant patient he has used the machine 20 out of 30 days and 26 of those days over 4 hours reaching a compliance of 87%.  The average user time is 6 hours 3 minutes uses an AutoSet between a minimum pressure of 7 maximum pressure of 13 cmH2O and 3 cm EPR however his residual AHI reflects an apnea index of 9.8/h of which 9.1 are obstructive in nature.  His 95th percentile pressure need is 12.9 cmH2O straddling the upper setting.  I would like to increase his maximum pressure from 13-16 cmH2O there has been no major air leakage so his settings and his supplies do not have to be changed otherwise.     CD/Interval history from 28 July 2018.  I have the pleasure of meeting Cameron Valenzuela today a 51 year old established CPAP patient in our practice who is followed for his primary care  needs by Dr. Belinda Fisher, MD.  He was last seen in April 2019 with the first compliance data gathered from his auto titration device after he underwent a sleep study and was diagnosed with sleep apnea.  His current AutoSet pressures between 7 and 13 cmH2O, with an expiratory pressure relief of 3 cmH2O.  He has a high snoring index while using CPAP which is unusual.  His apnea index is 2.7, his hypopnea index 3.6 but he has an RDI of 2.5 on top of these 2 data.  The 95th percentile pressure is 13 cmH2O as prescribed, the residual AHI total is 6.3 and a little bit too high.        UPDATE 4/15/2019[name], 51 year old male returns for follow-up with newly diagnosed obstructive sleep apnea here for initial CPAP compliance.   He denies difficulty adjusting to the machine.  Compliance data dated 09/24/2017-10/23/2017 shows compliance data greater than 4 hours at 97%.  Average usage 6 hours 8 minutes.  Set pressure 7-12 cm.  EPR level 3.  AHI 6.9.  ESS 4. FSS 36.  He returns for reevaluation. PLAN: CPAP compliance 97% Due to AHI of 6.9 we will increase max pressure slightly to 13 cm  Follow-up in 4 months for repeat compliance Cameron Bible, GNP, [facility], APRN    11/5/18[name] is a 51 y.o. male , seen here as in a referral from Dr. Larose Kells for snoring and witnessed apnea following a medical procedure under anesthesia.  Chief complaint according to patient : "I am not excessively tired or fatigued during the day, but the reported apnea is worrisome"   Cameron. Valenzuela is a Caucasian 51 year old married gentleman who presents following 2 lithotripsies under anesthesia- after each apnea had been noticed by medical staff. The first procedure took place in May,  the second in August 2018. The patient also reports that his body mass index and weight has fluctuated vastly, currently being at a higher end.  Last summer he lost about 35 pounds over a period of only 3 months while working out of state.  This came back with an additional weight gain, now at 240 pounds. Other diagnosis were listed below.    Sleep habits are as follows: The patient usually watches television during the last hour before he retreats to bed.  Bedtime is around 11 PM and he has no trouble to fall asleep.  He does not have a preferred sleep position, sleeps on 2 pillows, in the bedroom that is described as cool, quiet and dark.  He shares a bedroom with his wife. He does recall his dreams occasionally, dreams are not described as threatening or nightmarish in character.  He may have one nocturia break at night but not more. He wakes up spontaneously at about 5:30 AM, and he rises feeling usually refreshed and restored. He wakes with a dry mouth, rarely  with headaches in season for his allergic rhinitis.   Family history, paternal history of DM, Dementia, delusions.      REVIEW OF SYSTEMS: Full 14 system review of systems performed and notable only for those listed, all others are neg:  Gained further weight Sleep : Obstructive sleep apnea with CPAP.  FSS at 21/ 63 points  Epworth Sleepiness Scale at 3/ 24 points.   unchanged after Covid infection in May 2020.    ALLERGIES: No Known Allergies  HOME MEDICATIONS: Outpatient Medications Prior to Visit  Medication Sig Dispense Refill   buPROPion (WELLBUTRIN SR) 150 MG  12 hr tablet 1 po BID 60 tablet 0   Choline Fenofibrate (FENOFIBRIC ACID) 45 MG CPDR Take 1 tablet by mouth daily. 30 capsule 0   metFORMIN (GLUCOPHAGE) 500 MG tablet Take 1 tablet (500 mg total) by mouth 2 (two) times daily with a meal. 60 tablet 0   Vitamin D, Ergocalciferol, (DRISDOL) 1.25 MG (50000 UNIT) CAPS capsule Take 1 capsule (50,000 Units total) by mouth every 7 (seven) days. 12 capsule 0   Facility-Administered Medications Prior to Visit  Medication Dose Route Frequency Provider Last Rate Last Admin   0.9 %  sodium chloride infusion  500 mL Intravenous Continuous Jackquline Denmark, MD        PAST MEDICAL HISTORY: Past Medical History:  Diagnosis Date   Allergic rhinitis    Allergy    seasonal   Family history of adverse reaction to anesthesia    mother slow to wake up    Family history of breast cancer    Family history of ovarian cancer    Fatigue    History of kidney stones    Kidney stones    Knee pain, right    Lower extremity edema    Pinched nerve in neck    Rosacea    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 Karsten Ro   CYSTOSCOPY WITH STENT PLACEMENT Left 02/18/2017   Procedure: CYSTOSCOPY/ LEFT  RETROGRADE/ WITH LEFT STENT PLACEMENT;  Surgeon: Kathie Rhodes, MD;  Location: [facility];  Service: Urology;  Laterality: Left;   EXTRACORPOREAL SHOCK WAVE LITHOTRIPSY Left 02/18/2017   Procedure: LEFT EXTRACORPOREAL SHOCK WAVE LITHOTRIPSY (ESWL);  Surgeon: Franchot Gallo, MD;  Location: [facility];  Service: Urology;  Laterality: Left;   EXTRACORPOREAL SHOCK WAVE LITHOTRIPSY Left 04/11/2017   Procedure: LEFT EXTRACORPOREAL SHOCK WAVE LITHOTRIPSY (ESWL);  Surgeon: Irine Seal, MD;  Location: [facility];  Service: Urology;  Laterality: Left;   EXTRACORPOREAL SHOCK WAVE LITHOTRIPSY Left 02/23/2021   Procedure: EXTRACORPOREAL SHOCK WAVE LITHOTRIPSY (ESWL);  Surgeon: Lucas Mallow, MD;  Location: [facility];  Service: Urology;  Laterality: Left;   LITHOTRIPSY     02/18/17   neck bipsy     beningn, in his 75s    FAMILY HISTORY: Family History  Problem Relation Age of Onset   Hyperlipidemia Mother        M and others   Cancer Mother    Breast cancer Mother        dx early 62s   Diabetes Father    COPD Father    Heart disease Father        ?   Stroke Father    Sleep apnea Father    Obesity Father    Ovarian cancer Maternal Grandmother 72   Heart disease Maternal Grandfather    Heart disease Paternal Grandmother    Diabetes Other        many fam members    Colon cancer Neg Hx    Prostate cancer Neg Hx    Colon polyps Neg Hx    Esophageal cancer Neg Hx    Rectal cancer Neg Hx    Stomach cancer Neg Hx     SOCIAL HISTORY: Social History   Socioeconomic History   Marital status: Married  Spouse name: Vaughan Basta   Number of children: 1   Years of education: Not on file   Highest education level: Not on file  Occupational History   Occupation: Health and safety inspector , 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  Tobacco Use   Smoking status: Never   Smokeless tobacco: Never  Vaping Use   Vaping Use: Never used  Substance and Sexual Activity   Alcohol use: Not Currently    Comment: rarely   Drug use:  No   Sexual activity: Yes  Other Topics Concern   Not on file  Social History Narrative   Household-- pt, wife, child     (mother in law [name], passed away October 29, 2015)   Social Determinants of Health   Financial Resource Strain: Not on file  Food Insecurity: Not on file  Transportation Needs: Not on file  Physical Activity: Not on file  Stress: Not on file  Social Connections: Not on file  Intimate Partner Violence: Not on file     PHYSICAL EXAM  Vitals:   07/27/21 0918  BP: 112/70  Pulse: 70  SpO2: 99%  Weight: 249 lb (112.9 kg)  Height: 5\' 10"  (1.778 m)   Body mass index is 35.73 kg/m.  Generalized: Well developed, obese male in no acute distress  Head: normocephalic and atraumatic,. Oropharynx benign mallopatti 4-5 Neck: Supple, neck circumference 19 inches.  Musculoskeletal: No deformity   Neurological examination   Mentation: Alert oriented to time, place, history taking. Attention span and concentration appropriate. Recent and remote memory intact.  Follows all commands speech and language fluent. ESS 4  Cranial nerve :  Taste and smell are intact. He never lost either.    Pupils were equal round reactive to light extraocular movements were full,. Facial sensation and strength were normal. hearing was intact to finger rubbing bilaterally. Uvula tongue midline. head turning and shoulder shrug were normal and symmetric.Tongue protrusion into cheek strength was normal. Motor: normal bulk and tone, C 6 impingement.    DIAGNOSTIC DATA (LABS, IMAGING, TESTING)  download CPAP data .  - I reviewed patient records, labs, notes, testing and imaging myself where available. 07-27-2021:  Cameron Valenzuela is 100% compliant CPAP user by days and felt short of the 4-hour mark from only 2 out of 30 days with a median usage of 6 hours 10 minutes.  He is using an AutoSet now 51 years old with a minimum pressure of 8 and a maximum pressure of 18 cmH2O this to centimeter EPR.  His  AHI is 4.9 this is slightly higher than I like it is 95th percentile is for pressure 16.6 so I think we should increase the maximum pressure from 18-20.  His 95th percentile air leak is moderate 17.2 L a minute.  No central apneas are arising.  His serial number is [id number].  Lab Results  Component Value Date   WBC 5.3 08/12/2020   HGB 14.3 08/12/2020   HCT 42.5 08/12/2020   MCV 91.3 08/12/2020   PLT 165.0 08/12/2020      Component Value Date/Time   NA 142 05/17/2021 1008   K 4.7 05/17/2021 1008   CL 105 05/17/2021 1008   CO2 23 05/17/2021 1008   GLUCOSE 105 (H) 05/17/2021 1008   GLUCOSE 122 (H) 06/24/2018 0809   BUN 15 05/17/2021 1008   CREATININE 0.96 05/17/2021 1008   CALCIUM 9.4 05/17/2021 1008   PROT 6.7 05/17/2021 1008   ALBUMIN 4.7 05/17/2021 1008   AST 50 (H) 05/17/2021 1008  ALT 71 (H) 05/17/2021 1008   ALKPHOS 78 05/17/2021 1008   BILITOT 0.3 05/17/2021 1008   GFRNONAA 95 05/10/2020 0932   GFRAA 110 05/10/2020 0932   Lab Results  Component Value Date   CHOL 219 (H) 05/17/2021   HDL 29 (L) 05/17/2021   LDLCALC 146 (H) 05/17/2021   LDLDIRECT 116.0 06/24/2018   TRIG 238 (H) 05/17/2021   CHOLHDL 8 06/24/2018   Lab Results  Component Value Date   HGBA1C 5.6 05/17/2021   Lab Results  Component Value Date   VITAMINB12 559 05/10/2020   Lab Results  Component Value Date   TSH 3.31 01/27/2021      ASSESSMENT AND PLAN  51 y.o. year old male  has a past medical history of Allergic rhinitis, OSA on CPAP, Obesity, and infection to Covid 19 in 11-2018. BMI is now at 33.5.  AHI has increased to 4.8/ and he needs a higher pressure setting. Adjust max pressure to 18 cm water.   [name]   1) There are no central apneas emerging so I think we are safe to further increase the pressure unless the patient cannot tolerated. He has not noted air leaks- has facial hair.  He needs an ibcrease form 18 to 20 cm water.   He uses [employer], DME.  No aerophagia reported.   2)  he lost 50 pounds at the medical weight management with Dr. Leafy Ro, MD, but regained weight. His ALT and AST are elevated , presumable from a fatty liver.   3) cervical radiculopathy, consider ergonomic chnages to his workplace.  His right and dominant hand is now affected.   4) Snuffbox pain, arthritic? Recommended fish oil and ViT D supplement ( two times 2000/d or 4000 Units/d over the counter daily, rather than 50K once weekly.      [facility] [address] [place], [place] [address] [phone]

## 2021-07-28 ENCOUNTER — Ambulatory Visit: Payer: 59 | Admitting: Internal Medicine

## 2021-07-28 ENCOUNTER — Encounter: Payer: Self-pay | Admitting: Internal Medicine

## 2021-07-28 VITALS — BP 126/84 | HR 72 | Temp 98.4°F | Resp 18 | Ht 70.0 in | Wt 248.5 lb

## 2021-07-28 DIAGNOSIS — G5601 Carpal tunnel syndrome, right upper limb: Secondary | ICD-10-CM | POA: Diagnosis not present

## 2021-07-28 DIAGNOSIS — R200 Anesthesia of skin: Secondary | ICD-10-CM | POA: Diagnosis not present

## 2021-07-28 DIAGNOSIS — R202 Paresthesia of skin: Secondary | ICD-10-CM | POA: Diagnosis not present

## 2021-07-28 NOTE — Progress Notes (Signed)
Subjective:    Patient ID: Cameron Valenzuela, male    DOB: 06/30/1971, 51 y.o.   MRN: [id number]  DOS:  07/28/2021 Type of visit - description: Acute  Symptoms a started November 2022: Decrease sensitivity at the R hand, fingers #1, 2 and 3. No motor deficits. The decree sensitivity is constant, not particularly worse at night.  He denies any neck pain. No shoulder, wrist, elbow pain. Does not do any repetitive motions with the right hand.  Denies dizziness, headaches, diplopia or slurred speech.  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 Karsten Ro   CYSTOSCOPY WITH STENT PLACEMENT Left 02/18/2017   Procedure: CYSTOSCOPY/ LEFT RETROGRADE/ WITH LEFT STENT PLACEMENT;  Surgeon: Kathie Rhodes, MD;  Location: [facility];  Service: Urology;  Laterality: Left;   EXTRACORPOREAL SHOCK WAVE LITHOTRIPSY Left 02/18/2017   Procedure: LEFT EXTRACORPOREAL SHOCK WAVE LITHOTRIPSY (ESWL);  Surgeon: Franchot Gallo, MD;  Location: [facility];  Service: Urology;  Laterality: Left;   EXTRACORPOREAL SHOCK WAVE LITHOTRIPSY Left 04/11/2017   Procedure: LEFT EXTRACORPOREAL SHOCK WAVE LITHOTRIPSY (ESWL);  Surgeon: Irine Seal, MD;  Location: [facility];  Service: Urology;  Laterality: Left;   EXTRACORPOREAL SHOCK WAVE LITHOTRIPSY Left 02/23/2021   Procedure: EXTRACORPOREAL SHOCK WAVE LITHOTRIPSY (ESWL);  Surgeon: Lucas Mallow, MD;  [facility];  Service: Urology;  Laterality: Left;   LITHOTRIPSY     02/18/17   neck bipsy     beningn, in his 32s    Current Outpatient Medications  Medication Instructions   buPROPion (WELLBUTRIN SR) 150 MG 12 hr tablet 1 po BID   Choline Fenofibrate (FENOFIBRIC ACID) 45 MG CPDR 1 tablet, Oral, Daily   metFORMIN (GLUCOPHAGE) 500 mg, Oral, 2 times daily with meals   Vitamin D (Ergocalciferol) (DRISDOL) 50,000 Units, Oral, Every 7 days       Objective:   Physical Exam BP 126/84 (BP Location: Left Arm, Patient Position: Sitting, Cuff Size: Normal)    Pulse 72    Temp 98.4 F (36.9 C) (Oral)    Resp 18    Ht 5\' 10"  (1.778 m)    Wt 248 lb 8 oz (112.7 kg)    SpO2 97%    BMI 35.66 kg/m  General:   Well developed, NAD, BMI noted. HEENT:  Normocephalic . Face symmetric, atraumatic MSK: Hands symmetric to inspection and palpation with no muscle atrophy. Lower extremities: no pretibial edema bilaterally  Skin: Not pale. Not jaundice Neurologic:  alert & oriented X3.  Speech normal, gait appropriate for age and unassisted. DTR symmetric.  Hand motor symmetric. Psych--  Cognition and judgment appear intact.  Cooperative with normal attention span and concentration.  Behavior appropriate. No anxious or depressed appearing.      Assessment      Assessment Pre diabetes Seasonal allergies Rosacea Kidney stones ([facility]) OSA per sleep 2018 , has a CPAP  SCC dx 2019 Dx Covid 11/2018  PLAN: R CTS: 2 months history of hand numbness in a radial nerve distribution, suspect CTS.  Symptoms are constant so I believe further evaluation if needed. Plan: Refer to Ortho hand, start using a carpal tunnel splinter.   This visit occurred during the SARS-CoV-2 public health emergency.  Safety protocols were in place, including screening questions prior to the visit, additional usage of staff PPE, and extensive cleaning of exam room while observing appropriate contact  time as indicated for disinfecting solutions.

## 2021-07-28 NOTE — Patient Instructions (Signed)
Will refer you to the hand specialist  Use a carpal tunnel splinter

## 2021-07-30 NOTE — Assessment & Plan Note (Signed)
R CTS: 2 months history of hand numbness in a radial nerve distribution, suspect CTS.  Symptoms are constant so I believe further evaluation if needed. Plan: Refer to Ortho hand, start using a carpal tunnel splinter.

## 2021-08-08 ENCOUNTER — Other Ambulatory Visit: Payer: Self-pay

## 2021-08-08 ENCOUNTER — Encounter: Payer: Self-pay | Admitting: Physician Assistant

## 2021-08-08 ENCOUNTER — Encounter: Payer: Self-pay | Admitting: Neurology

## 2021-08-08 ENCOUNTER — Ambulatory Visit: Payer: 59 | Admitting: Physician Assistant

## 2021-08-08 DIAGNOSIS — Z85828 Personal history of other malignant neoplasm of skin: Secondary | ICD-10-CM | POA: Diagnosis not present

## 2021-08-08 DIAGNOSIS — L570 Actinic keratosis: Secondary | ICD-10-CM

## 2021-08-08 MED ORDER — TOLAK 4 % EX CREA: TOPICAL_CREAM | CUTANEOUS | 1 refills | Status: DC

## 2021-08-09 ENCOUNTER — Encounter: Payer: Self-pay | Admitting: Physician Assistant

## 2021-08-09 NOTE — Progress Notes (Signed)
° °  Follow-Up Visit   Subjective  Cameron Valenzuela is a 51 y.o. male who presents for the following: Actinic Keratosis (Discussed topical therapy at last visit and was told to come back for tolak cream. Personal history of non mole skin cancer, but no atypical nevi or melanoma. ). He has a lot of scaly spots on his face.   The following portions of the chart were reviewed this encounter and updated as appropriate:  Tobacco   Allergies   Meds   Problems   Med Hx   Surg Hx   Fam Hx       Objective  Well appearing patient in no apparent distress; mood and affect are within normal limits.  A focused examination was performed including face. Relevant physical exam findings are noted in the Assessment and Plan.  face Erythematous patches with gritty scale.   Assessment & Plan  AK (actinic keratosis) face  Fluorouracil (TOLAK) 4 % CREA - face Apply to affected area qhs Monday - Sunday x 2 weeks- expect irritation & avoid sunlight    I, Edwinna Rochette, PA-C, have reviewed all documentation's for this visit.  The documentation on 08/09/21 for the exam, diagnosis, procedures and orders are all accurate and complete.

## 2021-08-10 ENCOUNTER — Encounter (INDEPENDENT_AMBULATORY_CARE_PROVIDER_SITE_OTHER): Payer: Self-pay

## 2021-08-11 ENCOUNTER — Encounter: Payer: 59 | Admitting: Internal Medicine

## 2021-08-14 ENCOUNTER — Ambulatory Visit (INDEPENDENT_AMBULATORY_CARE_PROVIDER_SITE_OTHER): Payer: 59 | Admitting: Family Medicine

## 2021-08-18 ENCOUNTER — Encounter: Payer: 59 | Admitting: Internal Medicine

## 2021-08-22 ENCOUNTER — Ambulatory Visit (INDEPENDENT_AMBULATORY_CARE_PROVIDER_SITE_OTHER): Payer: 59 | Admitting: Family Medicine

## 2021-08-22 ENCOUNTER — Other Ambulatory Visit: Payer: Self-pay

## 2021-08-22 ENCOUNTER — Encounter (INDEPENDENT_AMBULATORY_CARE_PROVIDER_SITE_OTHER): Payer: Self-pay | Admitting: Family Medicine

## 2021-08-22 VITALS — BP 133/73 | HR 71 | Temp 97.7°F | Ht 70.0 in | Wt 233.0 lb

## 2021-08-22 DIAGNOSIS — E669 Obesity, unspecified: Secondary | ICD-10-CM | POA: Diagnosis not present

## 2021-08-22 DIAGNOSIS — E785 Hyperlipidemia, unspecified: Secondary | ICD-10-CM

## 2021-08-22 DIAGNOSIS — R7303 Prediabetes: Secondary | ICD-10-CM | POA: Diagnosis not present

## 2021-08-22 DIAGNOSIS — Z6836 Body mass index (BMI) 36.0-36.9, adult: Secondary | ICD-10-CM

## 2021-08-22 DIAGNOSIS — F3289 Other specified depressive episodes: Secondary | ICD-10-CM

## 2021-08-22 DIAGNOSIS — Z6833 Body mass index (BMI) 33.0-33.9, adult: Secondary | ICD-10-CM

## 2021-08-22 MED ORDER — FENOFIBRIC ACID 45 MG PO CPDR: 1.0000 | DELAYED_RELEASE_CAPSULE | Freq: Every day | ORAL | 0 refills | Status: DC

## 2021-08-22 MED ORDER — METFORMIN HCL 500 MG PO TABS: 500.0000 mg | ORAL_TABLET | Freq: Two times a day (BID) | ORAL | 0 refills | Status: DC

## 2021-08-22 MED ORDER — BUPROPION HCL ER (SR) 150 MG PO TB12: ORAL_TABLET | ORAL | 0 refills | Status: DC

## 2021-08-22 NOTE — Progress Notes (Signed)
Chief Complaint:   OBESITY Cameron Valenzuela is here to discuss his progress with his obesity treatment plan along with follow-up of his obesity related diagnoses. Cameron Valenzuela is on the Category 4 Plan and states he is following his eating plan approximately 65% of the time. Cameron Valenzuela states he is going to the gym 60 minutes 5 times per week.  Today's visit was #: 34 Starting weight: 256 lbs Starting date: 09/18/2018 Today's weight: 233 lbs Today's date: 08/22/2021 Total lbs lost to date: 23 Total lbs lost since last in-office visit: 10  Interim History: Pt's whole family is now going to gym. Meal plan compliance has increased as well as family is now helping with accountability. He is trying to limit quantity of food at work. Pt is wondering about how to structure exercise routine. He wants to do better with breakfast.   Subjective:   1. Dyslipidemia Cameron Valenzuela is on fenofibrate with no side effects. His last triglycerides level was 238.  2. Pre-diabetes Cameron Valenzuela's last A1c was 5.6 with an insulin level of 45.5. He is on Metformin with some stress eating of carbs.  3. Other depression with emotional eating Cameron Valenzuela denies suicidal or homicidal ideations. BP well controlled today.  Assessment/Plan:   1. Dyslipidemia Cardiovascular risk and specific lipid/LDL goals reviewed.  We discussed several lifestyle modifications today and Cameron Valenzuela will continue to work on diet, exercise and weight loss efforts. Orders and follow up as documented in patient record.   Counseling Intensive lifestyle modifications are the first line treatment for this issue. Dietary changes: Increase soluble fiber. Decrease simple carbohydrates. Exercise changes: Moderate to vigorous-intensity aerobic activity 150 minutes per week if tolerated. Lipid-lowering medications: see documented in medical record.  Refill- Choline Fenofibrate (FENOFIBRIC ACID) 45 MG CPDR; Take 1 tablet by mouth daily.  Dispense: 90 capsule; Refill:  0  2. Pre-diabetes Cameron Valenzuela will continue to work on weight loss, exercise, and decreasing simple carbohydrates to help decrease the risk of diabetes.   Refill- metFORMIN (GLUCOPHAGE) 500 MG tablet; Take 1 tablet (500 mg total) by mouth 2 (two) times daily with a meal.  Dispense: 60 tablet; Refill: 0  3. Other depression with emotional eating Behavior modification techniques were discussed today to help Cameron Valenzuela deal with his emotional/non-hunger eating behaviors.  Orders and follow up as documented in patient record.   Refill- buPROPion (WELLBUTRIN SR) 150 MG 12 hr tablet; 1 po BID  Dispense: 60 tablet; Refill: 0  4. Obesity with current BMI of 33.5 Cameron Valenzuela is currently in the action stage of change. As such, his goal is to continue with weight loss efforts. He has agreed to the Category 4 Plan.   Exercise goals: All adults should avoid inactivity. Some physical activity is better than none, and adults who participate in any amount of physical activity gain some health benefits. Incorporate more structured activity with resistance.  Behavioral modification strategies: increasing lean protein intake, meal planning and cooking strategies, keeping healthy foods in the home, and planning for success.  Cameron Valenzuela has agreed to follow-up with our clinic in 3-4 weeks. He was informed of the importance of frequent follow-up visits to maximize his success with intensive lifestyle modifications for his multiple health conditions.   Objective:   Blood pressure 133/73, pulse 71, temperature 97.7 F (36.5 C), height 5\' 10"  (1.778 m), weight 233 lb (105.7 kg), SpO2 99 %. Body mass index is 33.43 kg/m.  General: Cooperative, alert, well developed, in no acute distress. HEENT: Conjunctivae and lids unremarkable. Cardiovascular: Regular  rhythm.  Lungs: Normal work of breathing. Neurologic: No focal deficits.   Lab Results  Component Value Date   CREATININE 0.96 05/17/2021   BUN 15 05/17/2021   NA  142 05/17/2021   K 4.7 05/17/2021   CL 105 05/17/2021   CO2 23 05/17/2021   Lab Results  Component Value Date   ALT 71 (H) 05/17/2021   AST 50 (H) 05/17/2021   ALKPHOS 78 05/17/2021   BILITOT 0.3 05/17/2021   Lab Results  Component Value Date   HGBA1C 5.6 05/17/2021   HGBA1C 5.4 05/10/2020   HGBA1C 5.2 12/24/2019   HGBA1C 5.1 07/27/2019   HGBA1C 5.0 02/11/2019   Lab Results  Component Value Date   INSULIN 45.5 (H) 05/17/2021   INSULIN 25.0 (H) 05/10/2020   INSULIN 18.7 12/24/2019   INSULIN 22.7 07/27/2019   INSULIN 16.4 02/11/2019   Lab Results  Component Value Date   TSH 3.31 01/27/2021   Lab Results  Component Value Date   CHOL 219 (H) 05/17/2021   HDL 29 (L) 05/17/2021   LDLCALC 146 (H) 05/17/2021   LDLDIRECT 116.0 06/24/2018   TRIG 238 (H) 05/17/2021   CHOLHDL 8 06/24/2018   Lab Results  Component Value Date   VD25OH 25.5 (L) 05/17/2021   VD25OH 38.5 05/10/2020   VD25OH 44.3 12/24/2019   Lab Results  Component Value Date   WBC 5.3 08/12/2020   HGB 14.3 08/12/2020   HCT 42.5 08/12/2020   MCV 91.3 08/12/2020   PLT 165.0 08/12/2020   Attestation Statements:   Reviewed by clinician on day of visit: allergies, medications, problem list, medical history, surgical history, family history, social history, and previous encounter notes.  Coral Ceo, CMA, am acting as transcriptionist for Coralie Common, MD.   I have reviewed the above documentation for accuracy and completeness, and I agree with the above. - Coralie Common, MD

## 2021-09-05 ENCOUNTER — Other Ambulatory Visit (INDEPENDENT_AMBULATORY_CARE_PROVIDER_SITE_OTHER): Payer: Self-pay | Admitting: Family Medicine

## 2021-09-05 DIAGNOSIS — F3289 Other specified depressive episodes: Secondary | ICD-10-CM

## 2021-09-05 DIAGNOSIS — R7303 Prediabetes: Secondary | ICD-10-CM

## 2021-09-05 NOTE — Telephone Encounter (Signed)
Pt last seen by Dr. Ukleja.  

## 2021-09-19 ENCOUNTER — Ambulatory Visit (INDEPENDENT_AMBULATORY_CARE_PROVIDER_SITE_OTHER): Payer: 59 | Admitting: Family Medicine

## 2021-09-25 ENCOUNTER — Other Ambulatory Visit: Payer: Self-pay

## 2021-09-25 ENCOUNTER — Encounter (INDEPENDENT_AMBULATORY_CARE_PROVIDER_SITE_OTHER): Payer: Self-pay | Admitting: Family Medicine

## 2021-09-25 ENCOUNTER — Ambulatory Visit (INDEPENDENT_AMBULATORY_CARE_PROVIDER_SITE_OTHER): Payer: 59 | Admitting: Family Medicine

## 2021-09-25 VITALS — BP 125/76 | HR 78 | Temp 97.7°F | Ht 70.0 in | Wt 230.0 lb

## 2021-09-25 DIAGNOSIS — E785 Hyperlipidemia, unspecified: Secondary | ICD-10-CM

## 2021-09-25 DIAGNOSIS — Z6833 Body mass index (BMI) 33.0-33.9, adult: Secondary | ICD-10-CM

## 2021-09-25 DIAGNOSIS — Z9189 Other specified personal risk factors, not elsewhere classified: Secondary | ICD-10-CM

## 2021-09-25 DIAGNOSIS — E669 Obesity, unspecified: Secondary | ICD-10-CM | POA: Diagnosis not present

## 2021-09-25 DIAGNOSIS — E559 Vitamin D deficiency, unspecified: Secondary | ICD-10-CM

## 2021-09-25 MED ORDER — FENOFIBRIC ACID 45 MG PO CPDR: 1.0000 | DELAYED_RELEASE_CAPSULE | Freq: Every day | ORAL | 0 refills | Status: DC

## 2021-09-25 MED ORDER — VITAMIN D (ERGOCALCIFEROL) 1.25 MG (50000 UNIT) PO CAPS: 50000.0000 [IU] | ORAL_CAPSULE | ORAL | 0 refills | Status: DC

## 2021-09-26 NOTE — Progress Notes (Signed)
? ? ? ?Chief Complaint:  ? ?OBESITY ?Cameron Valenzuela is here to discuss his progress with his obesity treatment plan along with follow-up of his obesity related diagnoses. Cameron Valenzuela is on the Category 4 Plan and states he is following his eating plan approximately 65% of the time. Cameron Valenzuela states he is at the gym for 60 minutes 2-3 times per week. ? ?Today's visit was #: 16 ?Starting weight: 256 lbs ?Starting date: 09/18/2018 ?Today's weight: 230 lbs ?Today's date: 09/25/2021 ?Total lbs lost to date: 4 ?Total lbs lost since last in-office visit: 3 ? ?Interim History: Cameron Valenzuela voices that he hasn't been as compliant as he wanted in the last few weeks. His daughter and wife are struggling with motivation to continue mindful eating and exercise. His birthday is coming up. Work continues to be short staffed. ? ?Subjective:  ? ?1. Dyslipidemia ?Cameron Valenzuela is on fenofibrate acid, with no side effects noted. ? ?2. Vitamin D deficiency ?Cameron Valenzuela's last Vit D level was of 25.5. He notes fatigue. ? ?3. At risk for activity intolerance ?Cameron Valenzuela is at risk for exercise intolerance due to lack of time. ? ?Assessment/Plan:  ? ?1. Dyslipidemia ?We will refill Fenofibrate acid 45 mg PO daily for 90 days, and we will follow up at Cameron Valenzuela's next appointment. ? ?- Choline Fenofibrate (FENOFIBRIC ACID) 45 MG CPDR; Take 1 tablet by mouth daily.  Dispense: 90 capsule; Refill: 0 ? ?2. Vitamin D deficiency ?Cameron Valenzuela will continue prescription Vitamin D 50,000 IU weekly, and we will refill for 90 days. ? ?- Vitamin D, Ergocalciferol, (DRISDOL) 1.25 MG (50000 UNIT) CAPS capsule; Take 1 capsule (50,000 Units total) by mouth every 7 (seven) days.  Dispense: 12 capsule; Refill: 0 ? ?3. At risk for activity intolerance ?Cameron Valenzuela was given approximately 15 minutes of exercise intolerance counseling today. He is 51 y.o. male and has risk factors exercise intolerance including obesity. We discussed intensive lifestyle modifications today with an emphasis on specific  weight loss instructions and strategies. Cameron Valenzuela will slowly increase activity as tolerated. ? ?Repetitive spaced learning was employed today to elicit superior memory formation and behavioral change. ? ?4. Obesity with current BMI of 33.1 ?Cameron Valenzuela is currently in the action stage of change. As such, his goal is to continue with weight loss efforts. He has agreed to the Category 4 Plan.  ? ?Exercise goals: continue gym 3 times per week, and can increase if time is available.  ? ?Behavioral modification strategies: increasing lean protein intake, meal planning and cooking strategies, keeping healthy foods in the home, dealing with family or coworker sabotage, and planning for success. ? ?Cameron Valenzuela has agreed to follow-up with our clinic in 3 weeks. He was informed of the importance of frequent follow-up visits to maximize his success with intensive lifestyle modifications for his multiple health conditions.  ? ?Objective:  ? ?Blood pressure 125/76, pulse 78, temperature 97.7 ?F (36.5 ?C), height '5\' 10"'$  (1.778 m), weight 230 lb (104.3 kg), SpO2 99 %. ?Body mass index is 33 kg/m?. ? ?General: Cooperative, alert, well developed, in no acute distress. ?HEENT: Conjunctivae and lids unremarkable. ?Cardiovascular: Regular rhythm.  ?Lungs: Normal work of breathing. ?Neurologic: No focal deficits.  ? ?Lab Results  ?Component Value Date  ? CREATININE 0.96 05/17/2021  ? BUN 15 05/17/2021  ? NA 142 05/17/2021  ? K 4.7 05/17/2021  ? CL 105 05/17/2021  ? CO2 23 05/17/2021  ? ?Lab Results  ?Component Value Date  ? ALT 71 (H) 05/17/2021  ? AST 50 (H) 05/17/2021  ?  ALKPHOS 78 05/17/2021  ? BILITOT 0.3 05/17/2021  ? ?Lab Results  ?Component Value Date  ? HGBA1C 5.6 05/17/2021  ? HGBA1C 5.4 05/10/2020  ? HGBA1C 5.2 12/24/2019  ? HGBA1C 5.1 07/27/2019  ? HGBA1C 5.0 02/11/2019  ? ?Lab Results  ?Component Value Date  ? INSULIN 45.5 (H) 05/17/2021  ? INSULIN 25.0 (H) 05/10/2020  ? INSULIN 18.7 12/24/2019  ? INSULIN 22.7 07/27/2019  ? INSULIN  16.4 02/11/2019  ? ?Lab Results  ?Component Value Date  ? TSH 3.31 01/27/2021  ? ?Lab Results  ?Component Value Date  ? CHOL 219 (H) 05/17/2021  ? HDL 29 (L) 05/17/2021  ? LDLCALC 146 (H) 05/17/2021  ? LDLDIRECT 116.0 06/24/2018  ? TRIG 238 (H) 05/17/2021  ? CHOLHDL 8 06/24/2018  ? ?Lab Results  ?Component Value Date  ? VD25OH 25.5 (L) 05/17/2021  ? VD25OH 38.5 05/10/2020  ? VD25OH 44.3 12/24/2019  ? ?Lab Results  ?Component Value Date  ? WBC 5.3 08/12/2020  ? HGB 14.3 08/12/2020  ? HCT 42.5 08/12/2020  ? MCV 91.3 08/12/2020  ? PLT 165.0 08/12/2020  ? ?No results found for: IRON, TIBC, FERRITIN ? ?Attestation Statements:  ? ?Reviewed by clinician on day of visit: allergies, medications, problem list, medical history, surgical history, family history, social history, and previous encounter notes. ? ? ?I, Trixie Dredge, am acting as transcriptionist for Coralie Common, MD. ? ?I have reviewed the above documentation for accuracy and completeness, and I agree with the above. Coralie Common, MD ? ? ?

## 2021-10-12 HISTORY — PX: CARPAL TUNNEL RELEASE: SHX101

## 2021-10-13 ENCOUNTER — Encounter: Payer: 59 | Admitting: Internal Medicine

## 2021-10-17 ENCOUNTER — Ambulatory Visit (INDEPENDENT_AMBULATORY_CARE_PROVIDER_SITE_OTHER): Payer: 59 | Admitting: Family Medicine

## 2021-10-18 ENCOUNTER — Encounter: Payer: Self-pay | Admitting: Internal Medicine

## 2021-10-18 ENCOUNTER — Ambulatory Visit (INDEPENDENT_AMBULATORY_CARE_PROVIDER_SITE_OTHER): Payer: 59 | Admitting: Internal Medicine

## 2021-10-18 VITALS — BP 122/68 | HR 66 | Temp 98.3°F | Resp 16 | Ht 70.0 in | Wt 239.5 lb

## 2021-10-18 DIAGNOSIS — Z0000 Encounter for general adult medical examination without abnormal findings: Secondary | ICD-10-CM

## 2021-10-18 DIAGNOSIS — R7989 Other specified abnormal findings of blood chemistry: Secondary | ICD-10-CM

## 2021-10-18 DIAGNOSIS — E785 Hyperlipidemia, unspecified: Secondary | ICD-10-CM | POA: Diagnosis not present

## 2021-10-18 DIAGNOSIS — R739 Hyperglycemia, unspecified: Secondary | ICD-10-CM

## 2021-10-18 NOTE — Patient Instructions (Signed)
   GO TO THE LAB : Get the blood work     GO TO THE FRONT DESK, PLEASE SCHEDULE YOUR APPOINTMENTS Come back for a physical exam in 1 year 

## 2021-10-18 NOTE — Progress Notes (Signed)
? ?Subjective:  ? ? Patient ID: Cameron Valenzuela, male    DOB: 1971-07-07, 51 y.o.   MRN: [id number] ? ?DOS:  10/18/2021 ?Type of visit - description: cpx ? ?Here for CPX ?Recently had a R CTS surgery, doing well. ?Obesity: Working hard on his lifestyle.  Follow-up by the wellness clinic. ? ?Wt Readings from Last 3 Encounters:  ?10/18/21 239 lb 8 oz (108.6 kg)  ?09/25/21 230 lb (104.3 kg)  ?08/22/21 233 lb (105.7 kg)  ? ? ? ?Review of Systems ? ?Other than above, a 14 point review of systems is negative  ?  ? ?Past Medical History:  ?Diagnosis Date  ? Allergic rhinitis   ? Allergy   ? seasonal  ? Family history of adverse reaction to anesthesia   ? mother slow to wake up   ? Family history of breast cancer   ? Family history of ovarian cancer   ? Fatigue   ? History of kidney stones   ? Kidney stones   ? Knee pain, right   ? Lower extremity edema   ? Pinched nerve in neck   ? Rosacea   ? 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 Sleep apnea   ? uses CPAP  ? ? ?Past Surgical History:  ?Procedure Laterality Date  ? CARPAL TUNNEL RELEASE Right 10/12/2021  ? Cystocopy    ? Dr. Karsten Ro  ? CYSTOSCOPY WITH STENT PLACEMENT Left 02/18/2017  ? Procedure: CYSTOSCOPY/ LEFT RETROGRADE/ WITH LEFT STENT PLACEMENT;  Surgeon: Kathie Rhodes, MD;  Location: [facility];  Service: Urology;  Laterality: Left;  ? EXTRACORPOREAL SHOCK WAVE LITHOTRIPSY Left 02/18/2017  ? Procedure: LEFT EXTRACORPOREAL SHOCK WAVE LITHOTRIPSY (ESWL);  Surgeon: Franchot Gallo, MD;  Location: [facility];  Service: Urology;  Laterality: Left;  ? EXTRACORPOREAL SHOCK WAVE LITHOTRIPSY Left 04/11/2017  ? Procedure: LEFT EXTRACORPOREAL SHOCK WAVE LITHOTRIPSY (ESWL);  Surgeon: Irine Seal, MD;  Location: [facility];  Service: Urology;  Laterality: Left;  ? EXTRACORPOREAL SHOCK WAVE LITHOTRIPSY Left 02/23/2021  ? Procedure: EXTRACORPOREAL SHOCK WAVE LITHOTRIPSY (ESWL);  Surgeon: Lucas Mallow, MD;   Location: [facility];  Service: Urology;  Laterality: Left;  ? LITHOTRIPSY    ? 02/18/17  ? neck bipsy    ? beningn, in his 52s  ? ?Social History  ? ?Socioeconomic History  ? Marital status: Married  ?  Spouse name: Vaughan Basta  ? Number of children: 1  ? Years of education: Not on file  ? Highest education level: Not on file  ?Occupational History  ? Occupation: Health and safety inspector , 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  ?Tobacco Use  ? Smoking status: Never  ? Smokeless tobacco: Never  ?Vaping Use  ? Vaping Use: Never used  ?Substance and Sexual Activity  ? Alcohol use: Not Currently  ?  Comment: rarely  ? Drug use: No  ? Sexual activity: Yes  ?Other Topics Concern  ? Not on file  ?Social History Narrative  ? Household-- pt, wife, child    ? (mother in law [name], passed away 2015-10-08)  ? ?Social Determinants of Health  ? ?Financial Resource Strain: Not on file  ?Food Insecurity: Not on file  ?Transportation Needs: Not on file  ?Physical Activity: Not on file  ?Stress: Not on file  ?Social Connections: Not on file  ?Intimate Partner Violence: Not on file  ? ? ?Current Outpatient Medications  ?Medication Instructions  ?  buPROPion (WELLBUTRIN SR) 150 MG 12 hr tablet 1 po BID  ? Choline Fenofibrate (FENOFIBRIC ACID) 45 MG CPDR 1 tablet, Oral, Daily  ? Fluorouracil (TOLAK) 4 % CREA Apply to affected area qhs Monday - Sunday x 2 weeks- expect irritation & avoid sunlight  ? ibuprofen (ADVIL) 800 mg, Oral, 3 times daily PRN  ? metFORMIN (GLUCOPHAGE) 500 mg, Oral, 2 times daily with meals  ? Vitamin D (Ergocalciferol) (DRISDOL) 50,000 Units, Oral, Every 7 days  ? ? ?   ?Objective:  ? Physical Exam ?BP 122/68 (BP Location: Left Arm, Patient Position: Sitting, Cuff Size: Normal)   Pulse 66   Temp 98.3 ?F (36.8 ?C) (Oral)   Resp 16   Ht '5\' 10"'$  (1.778 m)   Wt 239 lb 8 oz (108.6 kg)   SpO2 97%   BMI 34.36 kg/m?  ?General: ?Well developed, NAD, BMI noted ?Neck: No  thyromegaly  ?HEENT:  ?Normocephalic . Face symmetric,  atraumatic ?Lungs:  ?CTA B ?Normal respiratory effort, no intercostal retractions, no accessory muscle use. ?Heart: RRR,  no murmur.  ?Abdomen:  ?Not distended, soft, non-tender. No rebound or rigidity. ?MSK: Has a small cast at the right wrist ?Lower extremities: no pretibial edema bilaterally  ?Skin: Exposed areas without rash. Not pale. Not jaundice ?Neurologic:  ?alert & oriented X3.  ?Speech normal, gait appropriate for age and unassisted ?Strength symmetric and appropriate for age.  ?Psych: ?Cognition and judgment appear intact.  ?Cooperative with normal attention span and concentration.  ?Behavior appropriate. ?No anxious or depressed appearing. ? ?   ?Assessment   ? ?Assessment ?Pre diabetes ?Dyslipidemia ?Seasonal allergies ?Kidney stones ([facility]) ?OSA per sleep 2018 , has a CPAP  ?Rosacea, SCC dx 2019 ?Obesity  ? ?PLAN: ?Here for CPX ?Prediabetes: On metformin, follow-up in the wellness clinic, check A1c ?Dyslipidemia: On fenofibrate, not fasting today, we agreed she will follow-up with the wellness clinic ?Rosacea: Sees dermatology regularly ?OSA: Reports good CPAP compliance ?Obesity: Working hard on his lifestyle, he lost a significant amount of weight at some point, then regained it and now is in the process of losing it again. ?Increased LFTs: Noted 11-22, no significant EtOH, no Tylenol, possibly fatty liver, rechecking today.  Currently taking ibuprofen regularly due to recent CTS surgery ?RTC 1 year ? ? ? ?This visit occurred during the SARS-CoV-2 public health emergency.  Safety protocols were in place, including screening questions prior to the visit, additional usage of staff PPE, and extensive cleaning of exam room while observing appropriate contact time as indicated for disinfecting solutions.  ? ?

## 2021-10-19 LAB — CBC WITH DIFFERENTIAL/PLATELET
Basophils Absolute: 0 10*3/uL (ref 0.0–0.1)
Basophils Relative: 0.9 % (ref 0.0–3.0)
Eosinophils Absolute: 0.1 10*3/uL (ref 0.0–0.7)
Eosinophils Relative: 1.9 % (ref 0.0–5.0)
HCT: 39.7 % (ref 39.0–52.0)
Hemoglobin: 13.4 g/dL (ref 13.0–17.0)
Lymphocytes Relative: 32.8 % (ref 12.0–46.0)
Lymphs Abs: 1.7 10*3/uL (ref 0.7–4.0)
MCHC: 33.7 g/dL (ref 30.0–36.0)
MCV: 88.6 fl (ref 78.0–100.0)
Monocytes Absolute: 0.3 10*3/uL (ref 0.1–1.0)
Monocytes Relative: 6.4 % (ref 3.0–12.0)
Neutro Abs: 3.1 10*3/uL (ref 1.4–7.7)
Neutrophils Relative %: 58 % (ref 43.0–77.0)
Platelets: 160 10*3/uL (ref 150.0–400.0)
RBC: 4.48 Mil/uL (ref 4.22–5.81)
RDW: 15.1 % (ref 11.5–15.5)
WBC: 5.3 10*3/uL (ref 4.0–10.5)

## 2021-10-19 LAB — TSH: TSH: 3.25 u[IU]/mL (ref 0.35–5.50)

## 2021-10-19 LAB — COMPREHENSIVE METABOLIC PANEL
ALT: 37 U/L (ref 0–53)
AST: 29 U/L (ref 0–37)
Albumin: 4.5 g/dL (ref 3.5–5.2)
Alkaline Phosphatase: 69 U/L (ref 39–117)
BUN: 18 mg/dL (ref 6–23)
CO2: 28 mEq/L (ref 19–32)
Calcium: 8.8 mg/dL (ref 8.4–10.5)
Chloride: 104 mEq/L (ref 96–112)
Creatinine, Ser: 0.95 mg/dL (ref 0.40–1.50)
GFR: 92.36 mL/min (ref >60.00)
Glucose, Bld: 81 mg/dL (ref 70–99)
Potassium: 4 mEq/L (ref 3.5–5.1)
Sodium: 140 mEq/L (ref 135–145)
Total Bilirubin: 0.3 mg/dL (ref 0.2–1.2)
Total Protein: 6.6 g/dL (ref 6.0–8.3)

## 2021-10-19 LAB — T4, FREE: Free T4: 0.85 ng/dL (ref 0.60–1.60)

## 2021-10-19 LAB — HEMOGLOBIN A1C: Hgb A1c MFr Bld: 5.4 % (ref 4.6–6.5)

## 2021-10-20 ENCOUNTER — Encounter: Payer: Self-pay | Admitting: Internal Medicine

## 2021-10-20 NOTE — Assessment & Plan Note (Signed)
-  Td 2016 ?- shingrix d/w pt  ?-COVID VAX: UTD ?-CCS:    no FH, cscope 10-2020, next per GI  ?-Prostate cancer screening: DRE and PSA normal last year ?-Diet-exercise: Discussed   ?- Available labs reviewed: Check a CMP, CBC, A1c, TSH and T4 ( TSH is slightly elevated before) ? ?  ?

## 2021-10-20 NOTE — Assessment & Plan Note (Signed)
Here for CPX ?Prediabetes: On metformin, follow-up in the wellness clinic, check A1c ?Dyslipidemia: On fenofibrate, not fasting today, we agreed she will follow-up with the wellness clinic ?Rosacea: Sees dermatology regularly ?OSA: Reports good CPAP compliance ?Obesity: Working hard on his lifestyle, he lost a significant amount of weight at some point, then regained it and now is in the process of losing it again. ?Increased LFTs: Noted 11-22, no significant EtOH, no Tylenol, possibly fatty liver, rechecking today.  Currently taking ibuprofen regularly due to recent CTS surgery ?RTC 1 year ?

## 2021-11-09 ENCOUNTER — Telehealth (INDEPENDENT_AMBULATORY_CARE_PROVIDER_SITE_OTHER): Payer: Self-pay | Admitting: Family Medicine

## 2021-11-09 NOTE — Telephone Encounter (Signed)
Pt called requesting refills: WELLBUTRIN SR 150 MG 12 hr tablet FENOFIBRIC ACID  DRISDOL 1.25 MG METFORMIN 500 MG tablet, take 2 daily (Pt states he has only been taking 1 per day) LAST WRITTEN: 08/22/21 NEXT APPT:  11/21/21

## 2021-11-21 ENCOUNTER — Telehealth (INDEPENDENT_AMBULATORY_CARE_PROVIDER_SITE_OTHER): Payer: 59 | Admitting: Family Medicine

## 2021-11-21 ENCOUNTER — Encounter (INDEPENDENT_AMBULATORY_CARE_PROVIDER_SITE_OTHER): Payer: Self-pay | Admitting: Family Medicine

## 2021-11-21 DIAGNOSIS — E559 Vitamin D deficiency, unspecified: Secondary | ICD-10-CM | POA: Diagnosis not present

## 2021-11-21 DIAGNOSIS — R7303 Prediabetes: Secondary | ICD-10-CM | POA: Diagnosis not present

## 2021-11-21 DIAGNOSIS — Z6833 Body mass index (BMI) 33.0-33.9, adult: Secondary | ICD-10-CM

## 2021-11-21 DIAGNOSIS — F3289 Other specified depressive episodes: Secondary | ICD-10-CM | POA: Diagnosis not present

## 2021-11-21 DIAGNOSIS — E669 Obesity, unspecified: Secondary | ICD-10-CM

## 2021-11-21 MED ORDER — VITAMIN D (ERGOCALCIFEROL) 1.25 MG (50000 UNIT) PO CAPS: 50000.0000 [IU] | ORAL_CAPSULE | ORAL | 0 refills | Status: DC

## 2021-11-21 MED ORDER — METFORMIN HCL 500 MG PO TABS: 500.0000 mg | ORAL_TABLET | Freq: Two times a day (BID) | ORAL | 0 refills | Status: DC

## 2021-11-21 MED ORDER — BUPROPION HCL ER (SR) 150 MG PO TB12: ORAL_TABLET | ORAL | 0 refills | Status: DC

## 2021-11-29 NOTE — Progress Notes (Signed)
TeleHealth Visit:  Due to the COVID-19 pandemic, this visit was completed with telemedicine (audio/video) technology to reduce patient and provider exposure as well as to preserve personal protective equipment.   Cameron Valenzuela has verbally consented to this TeleHealth visit. The patient is located at work, the provider is located at home. The participants in this visit include the listed provider and patient. The visit was conducted today via Mychart video.  Chief Complaint: OBESITY Cameron Valenzuela is here to discuss his progress with his obesity treatment plan along with follow-up of his obesity related diagnoses. Cameron Valenzuela is on the Category 4 Plan and states he is following his eating plan approximately 65-70% of the time. Cameron Valenzuela states he is walking 1 1/2 miles 2-3 times per week.  Today's visit was #: 60 Starting weight: 256 lbs Starting date: 09/18/2018  Interim History: Since last appointment he got carpal tunnel surgery and is very limited in activity type. Foodwise he has been following pan as much as he can. He has a week off around [facility], where he will be going to see friends. He thinks following calorie intake and protein content over the next few weeks will be easy.  Subjective:   1. Vitamin D deficiency Jahkeem's last Vit D level of 25.5.  2. Pre-diabetes Taurean's A1c recently of 5.4. He is currently taking Metformin  with good satiety.   3. Other depression with emotional eating Denies suicidal ideas, and homicidal ideas. Significant improvement in symptoms.  Assessment/Plan:   1. Vitamin D deficiency We will refill Vit D 50K IU weekly for 3 months with zero refills. Labs at next appointment, previously not at goal.  -Refill Vitamin D, Ergocalciferol, (DRISDOL) 1.25 MG (50000 UNIT) CAPS capsule; Take 1 capsule (50,000 Units total) by mouth every 7 (seven) days.  Dispense: 12 capsule; Refill: 0  2. Pre-diabetes We will refill Metformin 500 mg, by mouth, twice a day for 1  month with zero refills.  -Refill metFORMIN (GLUCOPHAGE) 500 MG tablet; Take 1 tablet (500 mg total) by mouth 2 (two) times daily with a meal.  Dispense: 60 tablet; Refill: 0  3. Other depression with emotional eating We will refill Wellbutrin 150 mg by mouth, twice a day for 1 month with zero refills.  -Refill buPROPion (WELLBUTRIN SR) 150 MG 12 hr tablet; 1 po BID  Dispense: 60 tablet; Refill: 0  4. Obesity with current BMI of 33.0 Cameron Valenzuela is currently in the action stage of change. As such, his goal is to continue with weight loss efforts. He has agreed to the Category 4 Plan and keeping a food journal and adhering to recommended goals of 1800 calories and 140+ grams of  protein daily.  Exercise goals: All adults should avoid inactivity. Some physical activity is better than none, and adults who participate in any amount of physical activity gain some health benefits.  Behavioral modification strategies: increasing lean protein intake, meal planning and cooking strategies, keeping healthy foods in the home, travel eating strategies, planning for success, and keeping a strict food journal.  Cameron Valenzuela has agreed to follow-up with our clinic in 3 weeks. He was informed of the importance of frequent follow-up visits to maximize his success with intensive lifestyle modifications for his multiple health conditions.  Objective:   VITALS: Per patient if applicable, see vitals. GENERAL: Alert and in no acute distress. CARDIOPULMONARY: No increased WOB. Speaking in clear sentences.  PSYCH: Pleasant and cooperative. Speech normal rate and rhythm. Affect is appropriate. Insight and judgement are appropriate.  Attention is focused, linear, and appropriate.  NEURO: Oriented as arrived to appointment on time with no prompting.   Lab Results  Component Value Date   CREATININE 0.95 10/18/2021   BUN 18 10/18/2021   NA 140 10/18/2021   K 4.0 10/18/2021   CL 104 10/18/2021   CO2 28 10/18/2021   Lab  Results  Component Value Date   ALT 37 10/18/2021   AST 29 10/18/2021   ALKPHOS 69 10/18/2021   BILITOT 0.3 10/18/2021   Lab Results  Component Value Date   HGBA1C 5.4 10/18/2021   HGBA1C 5.6 05/17/2021   HGBA1C 5.4 05/10/2020   HGBA1C 5.2 12/24/2019   HGBA1C 5.1 07/27/2019   Lab Results  Component Value Date   INSULIN 45.5 (H) 05/17/2021   INSULIN 25.0 (H) 05/10/2020   INSULIN 18.7 12/24/2019   INSULIN 22.7 07/27/2019   INSULIN 16.4 02/11/2019   Lab Results  Component Value Date   TSH 3.25 10/18/2021   Lab Results  Component Value Date   CHOL 219 (H) 05/17/2021   HDL 29 (L) 05/17/2021   LDLCALC 146 (H) 05/17/2021   LDLDIRECT 116.0 06/24/2018   TRIG 238 (H) 05/17/2021   CHOLHDL 8 06/24/2018   Lab Results  Component Value Date   VD25OH 25.5 (L) 05/17/2021   VD25OH 38.5 05/10/2020   VD25OH 44.3 12/24/2019   Lab Results  Component Value Date   WBC 5.3 10/18/2021   HGB 13.4 10/18/2021   HCT 39.7 10/18/2021   MCV 88.6 10/18/2021   PLT 160.0 10/18/2021   No results found for: IRON, TIBC, FERRITIN  Attestation Statements:   Reviewed by clinician on day of visit: allergies, medications, problem list, medical history, surgical history, family history, social history, and previous encounter notes.  I, Elnora Morrison, RMA am acting as transcriptionist for Coralie Common, MD.  I have reviewed the above documentation for accuracy and completeness, and I agree with the above. - Coralie Common, MD

## 2021-12-14 ENCOUNTER — Ambulatory Visit (INDEPENDENT_AMBULATORY_CARE_PROVIDER_SITE_OTHER): Payer: 59 | Admitting: Family Medicine

## 2021-12-18 ENCOUNTER — Encounter (INDEPENDENT_AMBULATORY_CARE_PROVIDER_SITE_OTHER): Payer: Self-pay | Admitting: Family Medicine

## 2021-12-18 ENCOUNTER — Ambulatory Visit (INDEPENDENT_AMBULATORY_CARE_PROVIDER_SITE_OTHER): Payer: 59 | Admitting: Family Medicine

## 2021-12-18 VITALS — BP 133/82 | HR 68 | Temp 98.1°F | Ht 70.0 in | Wt 238.0 lb

## 2021-12-18 DIAGNOSIS — E559 Vitamin D deficiency, unspecified: Secondary | ICD-10-CM | POA: Diagnosis not present

## 2021-12-18 DIAGNOSIS — Z9189 Other specified personal risk factors, not elsewhere classified: Secondary | ICD-10-CM

## 2021-12-18 DIAGNOSIS — R7303 Prediabetes: Secondary | ICD-10-CM

## 2021-12-18 DIAGNOSIS — E785 Hyperlipidemia, unspecified: Secondary | ICD-10-CM | POA: Diagnosis not present

## 2021-12-18 DIAGNOSIS — F3289 Other specified depressive episodes: Secondary | ICD-10-CM | POA: Diagnosis not present

## 2021-12-18 DIAGNOSIS — Z7984 Long term (current) use of oral hypoglycemic drugs: Secondary | ICD-10-CM

## 2021-12-18 DIAGNOSIS — E669 Obesity, unspecified: Secondary | ICD-10-CM

## 2021-12-18 DIAGNOSIS — Z6834 Body mass index (BMI) 34.0-34.9, adult: Secondary | ICD-10-CM

## 2021-12-18 MED ORDER — METFORMIN HCL 500 MG PO TABS: 500.0000 mg | ORAL_TABLET | Freq: Two times a day (BID) | ORAL | 0 refills | Status: DC

## 2021-12-18 MED ORDER — BUPROPION HCL ER (SR) 150 MG PO TB12: ORAL_TABLET | ORAL | 0 refills | Status: DC

## 2021-12-18 MED ORDER — VITAMIN D (ERGOCALCIFEROL) 1.25 MG (50000 UNIT) PO CAPS: 50000.0000 [IU] | ORAL_CAPSULE | ORAL | 0 refills | Status: DC

## 2021-12-18 MED ORDER — FENOFIBRIC ACID 45 MG PO CPDR: 1.0000 | DELAYED_RELEASE_CAPSULE | Freq: Every day | ORAL | 0 refills | Status: DC

## 2021-12-21 NOTE — Progress Notes (Signed)
Chief Complaint:   OBESITY Cameron Valenzuela is here to discuss his progress with his obesity treatment plan along with follow-up of his obesity related diagnoses. Cameron Valenzuela is on the Category 4 Plan and keeping a food journal and adhering to recommended goals of 1800 calories and 140+ grams of protein and states he is following his eating plan approximately 50% of the time. Cameron Valenzuela states he is walking 60 minutes 3 times per week.  Today's visit was #: 46 Starting weight: 256 lbs Starting date: 09/18/2018 Today's weight: 238 lbs Today's date: 12/18/2021 Total lbs lost to date: 18 lbs Total lbs lost since last in-office visit: 0  Interim History: Cameron Valenzuela went to [place] and ate slightly more indulgently than he would have liked. 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 [place] trip and then otherwise normal home life. He realizes that the house overall has lost focus on meal planning.  Subjective:   1. Dyslipidemia Hargis currently taking Fenofibrate. His last LDL of 146, HDL of 29 and Trigly 238.  2. Vitamin D deficiency [name] is currently taking prescription Vit D 50,000 IU once a week.  He notes fatigue. His last Vit D level of 25.5 without consistent use of Vit D.  3. Pre-diabetes Cameron Valenzuela is currently taking Metformin twice a day without GI side effects. His last A1c at 5.3, insulin 45.5.  4. Other depression with emotional eating Cameron Valenzuela is currently on Wellbutrin 150 twice a day. Denies suicidal ideas, and homicidal ideas.Good control of cravings.  5. At risk for activity intolerance Cameron Valenzuela is at risk of exercise intolerance due to obesity and not consistently exercising.  Assessment/Plan:   1. Dyslipidemia We will refill Fenofibrate 45 mg by mouth daily for 1 month with 0 refills.  -Refill Choline Fenofibrate (FENOFIBRIC ACID) 45 MG CPDR; Take 1 tablet by mouth daily.  Dispense: 90 capsule; Refill: 0  2. Vitamin D deficiency We will refill Vit D 50,000  IU once a week for 1 month with 0 refills.  -Refill Vitamin D, Ergocalciferol, (DRISDOL) 1.25 MG (50000 UNIT) CAPS capsule; Take 1 capsule (50,000 Units total) by mouth every 7 (seven) days.  Dispense: 12 capsule; Refill: 0  3. Pre-diabetes We will refill Metformin 500 mg by mouth twice a day for 1 month with 0 refills.  -Refill metFORMIN (GLUCOPHAGE) 500 MG tablet; Take 1 tablet (500 mg total) by mouth 2 (two) times daily with a meal.  Dispense: 60 tablet; Refill: 0  4. Other depression with emotional eating We will refill Wellbutrin SR 150 mg by mouth twice a day for 1 month wirh 0 refills.  -Refill buPROPion (WELLBUTRIN SR) 150 MG 12 hr tablet; 1 po BID  Dispense: 60 tablet; Refill: 0  5. At risk for activity intolerance Per was given approximately 15 minutes of exercise intolerance counseling today. He is 51 y.o. male and has risk factors exercise intolerance including obesity. We discussed intensive lifestyle modifications today with an emphasis on specific weight loss instructions and strategies. Durward will slowly increase activity as tolerated.  Repetitive spaced learning was employed today to elicit superior memory formation and behavioral change.  6. Obesity with current BMI of 34.1 Hanniel is currently in the action stage of change. As such, his goal is to continue with weight loss efforts. He has agreed to the Category 4 Plan.   Exercise goals: All adults should avoid inactivity. Some physical activity is better than none, and adults who participate in any amount of physical  activity gain some health benefits.  Behavioral modification strategies: increasing lean protein intake, meal planning and cooking strategies, keeping healthy foods in the home, and planning for success.  Cameron Valenzuela has agreed to follow-up with our clinic in 3 weeks. He was informed of the importance of frequent follow-up visits to maximize his success with intensive lifestyle modifications for his  multiple health conditions.   Objective:   Blood pressure 133/82, pulse 68, temperature 98.1 F (36.7 C), height 5\' 10"  (1.778 m), weight 238 lb (108 kg), SpO2 100 %. Body mass index is 34.15 kg/m.  General: Cooperative, alert, well developed, in no acute distress. HEENT: Conjunctivae and lids unremarkable. Cardiovascular: Regular rhythm.  Lungs: Normal work of breathing. Neurologic: No focal deficits.   Lab Results  Component Value Date   CREATININE 0.95 10/18/2021   BUN 18 10/18/2021   NA 140 10/18/2021   K 4.0 10/18/2021   CL 104 10/18/2021   CO2 28 10/18/2021   Lab Results  Component Value Date   ALT 37 10/18/2021   AST 29 10/18/2021   ALKPHOS 69 10/18/2021   BILITOT 0.3 10/18/2021   Lab Results  Component Value Date   HGBA1C 5.4 10/18/2021   HGBA1C 5.6 05/17/2021   HGBA1C 5.4 05/10/2020   HGBA1C 5.2 12/24/2019   HGBA1C 5.1 07/27/2019   Lab Results  Component Value Date   INSULIN 45.5 (H) 05/17/2021   INSULIN 25.0 (H) 05/10/2020   INSULIN 18.7 12/24/2019   INSULIN 22.7 07/27/2019   INSULIN 16.4 02/11/2019   Lab Results  Component Value Date   TSH 3.25 10/18/2021   Lab Results  Component Value Date   CHOL 219 (H) 05/17/2021   HDL 29 (L) 05/17/2021   LDLCALC 146 (H) 05/17/2021   LDLDIRECT 116.0 06/24/2018   TRIG 238 (H) 05/17/2021   CHOLHDL 8 06/24/2018   Lab Results  Component Value Date   VD25OH 25.5 (L) 05/17/2021   VD25OH 38.5 05/10/2020   VD25OH 44.3 12/24/2019   Lab Results  Component Value Date   WBC 5.3 10/18/2021   HGB 13.4 10/18/2021   HCT 39.7 10/18/2021   MCV 88.6 10/18/2021   PLT 160.0 10/18/2021   No results found for: "IRON", "TIBC", "FERRITIN"  Attestation Statements:   Reviewed by clinician on day of visit: allergies, medications, problem list, medical history, surgical history, family history, social history, and previous encounter notes.  I, Fortino Sic, RMA am acting as transcriptionist for Reuben Likes,  MD.  I have reviewed the above documentation for accuracy and completeness, and I agree with the above. - Reuben Likes, MD

## 2022-01-08 ENCOUNTER — Encounter (INDEPENDENT_AMBULATORY_CARE_PROVIDER_SITE_OTHER): Payer: Self-pay | Admitting: Family Medicine

## 2022-01-08 ENCOUNTER — Ambulatory Visit (INDEPENDENT_AMBULATORY_CARE_PROVIDER_SITE_OTHER): Payer: 59 | Admitting: Family Medicine

## 2022-01-08 VITALS — BP 128/77 | HR 74 | Temp 98.3°F | Ht 70.0 in | Wt 234.0 lb

## 2022-01-08 DIAGNOSIS — Z9189 Other specified personal risk factors, not elsewhere classified: Secondary | ICD-10-CM

## 2022-01-08 DIAGNOSIS — F3289 Other specified depressive episodes: Secondary | ICD-10-CM

## 2022-01-08 DIAGNOSIS — Z6833 Body mass index (BMI) 33.0-33.9, adult: Secondary | ICD-10-CM | POA: Diagnosis not present

## 2022-01-08 DIAGNOSIS — E669 Obesity, unspecified: Secondary | ICD-10-CM

## 2022-01-08 DIAGNOSIS — R7303 Prediabetes: Secondary | ICD-10-CM | POA: Diagnosis not present

## 2022-01-08 MED ORDER — METFORMIN HCL 500 MG PO TABS: 500.0000 mg | ORAL_TABLET | Freq: Two times a day (BID) | ORAL | 0 refills | Status: DC

## 2022-01-08 MED ORDER — BUPROPION HCL ER (SR) 150 MG PO TB12: ORAL_TABLET | ORAL | 0 refills | Status: DC

## 2022-01-10 NOTE — Progress Notes (Signed)
Chief Complaint:   OBESITY Cameron Valenzuela is here to discuss his progress with his obesity treatment plan along with follow-up of his obesity related diagnoses. Cameron Valenzuela is on the Category 4 Plan and states he is following his eating plan approximately 60% of the time. Cameron Valenzuela states he is walking 1 mile  2-3 times per week.  Today's visit was #: 16 Starting weight: 256 lbs Starting date: 09/18/2018 Today's weight: 234 lbs Today's date: 01/08/2022 Total lbs lost to date: 22 lbs Total lbs lost since last in-office visit: 4  Interim History: Cameron Valenzuela voices that his wife has started meal planning with him. They are making a point to be mindful of more protein and calories he is getting in. Trying to get a bit more exercise in. His wife and daughter are going to [place] and so he is anticipating that it will be easier to plan and follow meal plan.  Subjective:   1. Pre-diabetes Cameron Valenzuela's last A1c was 5.4, insulin was done 5 months prior and was 45.5. Denies GI side effects.  2. Other depression with emotional eating Cameron Valenzuela is currently taking Wellbutrin 150 mg twice a day. Denies suicidal ideas, and homicidal ideas.  3. At risk for activity intolerance Cameron Valenzuela is at risk of exercise intolerance due to obesity.  Assessment/Plan:   1. Pre-diabetes We will refill Metformin 500 mg by mouth twice a day for 1 month with 0 refills.  -Refill metFORMIN (GLUCOPHAGE) 500 MG tablet; Take 1 tablet (500 mg total) by mouth 2 (two) times daily with a meal.  Dispense: 60 tablet; Refill: 0  2. Other depression with emotional eating We will refill Wellbutrin SR 150 mg by mouth twice a day for 1 month with 0 refills.  -Refill buPROPion (WELLBUTRIN SR) 150 MG 12 hr tablet; 1 po BID  Dispense: 180 tablet; Refill: 0  3. At risk for activity intolerance Cameron Valenzuela was given approximately 15 minutes of exercise intolerance counseling today. He is 51 y.o. male and has risk factors exercise intolerance including  obesity. We discussed intensive lifestyle modifications today with an emphasis on specific weight loss instructions and strategies. Cameron Valenzuela will slowly increase activity as tolerated.  Repetitive spaced learning was employed today to elicit superior memory formation and behavioral change.  4. Obesity with current BMI of 33.6 Cameron Valenzuela is currently in the action stage of change. As such, his goal is to continue with weight loss efforts. He has agreed to the Category 4 Plan.   Exercise goals: As is.  Behavioral modification strategies: increasing lean protein intake, meal planning and cooking strategies, and keeping healthy foods in the home.  Cameron Valenzuela has agreed to follow-up with our clinic in 4 weeks. He was informed of the importance of frequent follow-up visits to maximize his success with intensive lifestyle modifications for his multiple health conditions.   Objective:   Blood pressure 128/77, pulse 74, temperature 98.3 F (36.8 C), height '5\' 10"'$  (1.778 m), weight 234 lb (106.1 kg), SpO2 98 %. Body mass index is 33.58 kg/m.  General: Cooperative, alert, well developed, in no acute distress. HEENT: Conjunctivae and lids unremarkable. Cardiovascular: Regular rhythm.  Lungs: Normal work of breathing. Neurologic: No focal deficits.   Lab Results  Component Value Date   CREATININE 0.95 10/18/2021   BUN 18 10/18/2021   NA 140 10/18/2021   K 4.0 10/18/2021   CL 104 10/18/2021   CO2 28 10/18/2021   Lab Results  Component Value Date   ALT 37 10/18/2021   AST  29 10/18/2021   ALKPHOS 69 10/18/2021   BILITOT 0.3 10/18/2021   Lab Results  Component Value Date   HGBA1C 5.4 10/18/2021   HGBA1C 5.6 05/17/2021   HGBA1C 5.4 05/10/2020   HGBA1C 5.2 12/24/2019   HGBA1C 5.1 07/27/2019   Lab Results  Component Value Date   INSULIN 45.5 (H) 05/17/2021   INSULIN 25.0 (H) 05/10/2020   INSULIN 18.7 12/24/2019   INSULIN 22.7 07/27/2019   INSULIN 16.4 02/11/2019   Lab Results   Component Value Date   TSH 3.25 10/18/2021   Lab Results  Component Value Date   CHOL 219 (H) 05/17/2021   HDL 29 (L) 05/17/2021   LDLCALC 146 (H) 05/17/2021   LDLDIRECT 116.0 06/24/2018   TRIG 238 (H) 05/17/2021   CHOLHDL 8 06/24/2018   Lab Results  Component Value Date   VD25OH 25.5 (L) 05/17/2021   VD25OH 38.5 05/10/2020   VD25OH 44.3 12/24/2019   Lab Results  Component Value Date   WBC 5.3 10/18/2021   HGB 13.4 10/18/2021   HCT 39.7 10/18/2021   MCV 88.6 10/18/2021   PLT 160.0 10/18/2021   No results found for: "IRON", "TIBC", "FERRITIN"  Attestation Statements:   Reviewed by clinician on day of visit: allergies, medications, problem list, medical history, surgical history, family history, social history, and previous encounter notes.  I, Elnora Morrison, RMA am acting as transcriptionist for Coralie Common, MD.  I have reviewed the above documentation for accuracy and completeness, and I agree with the above. - Coralie Common, MD

## 2022-02-02 ENCOUNTER — Encounter: Payer: Self-pay | Admitting: Family Medicine

## 2022-02-02 ENCOUNTER — Telehealth (INDEPENDENT_AMBULATORY_CARE_PROVIDER_SITE_OTHER): Payer: 59 | Admitting: Family Medicine

## 2022-02-02 DIAGNOSIS — U071 COVID-19: Secondary | ICD-10-CM | POA: Diagnosis not present

## 2022-02-02 MED ORDER — MOLNUPIRAVIR EUA 200MG CAPSULE: 4.0000 | ORAL_CAPSULE | Freq: Two times a day (BID) | ORAL | 0 refills | Status: AC

## 2022-02-02 NOTE — Progress Notes (Signed)
Chief Complaint  Patient presents with   Covid Positive    Tested today 02/02/22    Fever    Head congestion    Cameron Valenzuela here for URI complaints. Due to COVID-19 pandemic, we are interacting via web portal for an electronic face-to-face visit. I verified patient's ID using 2 identifiers. Patient agreed to proceed with visit via this method. Patient is at home, I am at office. Patient and I are present for visit.   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  Tylenol, ibuprofen, Alka-Seltzer which is worked well for him in the past. Continue to push fluids, practice good hand hygiene, cover mouth when coughing. We discussed the CDC COVID quarantining guidelines. F/u prn. If starting to experience irreplaceable fluid loss, shaking, or shortness of breath, seek immediate care. Pt voiced understanding and agreement to the plan.  [place], DO 02/02/22 2:05 PM

## 2022-02-06 ENCOUNTER — Ambulatory Visit (INDEPENDENT_AMBULATORY_CARE_PROVIDER_SITE_OTHER): Payer: 59 | Admitting: Family Medicine

## 2022-02-21 ENCOUNTER — Encounter (INDEPENDENT_AMBULATORY_CARE_PROVIDER_SITE_OTHER): Payer: Self-pay

## 2022-03-01 ENCOUNTER — Ambulatory Visit (INDEPENDENT_AMBULATORY_CARE_PROVIDER_SITE_OTHER): Payer: 59 | Admitting: Family Medicine

## 2022-03-06 ENCOUNTER — Encounter (INDEPENDENT_AMBULATORY_CARE_PROVIDER_SITE_OTHER): Payer: Self-pay | Admitting: Family Medicine

## 2022-03-06 ENCOUNTER — Other Ambulatory Visit (INDEPENDENT_AMBULATORY_CARE_PROVIDER_SITE_OTHER): Payer: Self-pay | Admitting: Family Medicine

## 2022-03-06 ENCOUNTER — Ambulatory Visit (INDEPENDENT_AMBULATORY_CARE_PROVIDER_SITE_OTHER): Payer: 59 | Admitting: Family Medicine

## 2022-03-06 VITALS — BP 128/75 | HR 84 | Temp 98.2°F | Ht 70.0 in | Wt 232.0 lb

## 2022-03-06 DIAGNOSIS — E7849 Other hyperlipidemia: Secondary | ICD-10-CM

## 2022-03-06 DIAGNOSIS — E559 Vitamin D deficiency, unspecified: Secondary | ICD-10-CM | POA: Diagnosis not present

## 2022-03-06 DIAGNOSIS — Z6833 Body mass index (BMI) 33.0-33.9, adult: Secondary | ICD-10-CM

## 2022-03-06 DIAGNOSIS — E669 Obesity, unspecified: Secondary | ICD-10-CM | POA: Diagnosis not present

## 2022-03-06 DIAGNOSIS — E785 Hyperlipidemia, unspecified: Secondary | ICD-10-CM

## 2022-03-06 MED ORDER — FENOFIBRIC ACID 45 MG PO CPDR: 1.0000 | DELAYED_RELEASE_CAPSULE | Freq: Every day | ORAL | 0 refills | Status: DC

## 2022-03-20 NOTE — Progress Notes (Signed)
Chief Complaint:   OBESITY Cameron Valenzuela is here to discuss his progress with his obesity treatment plan along with follow-up of his obesity related diagnoses. [name] is on the Category 4 Plan and states he is following his eating plan approximately 60% of the time. Galileo states he is walking 12-15,000 steps at work 5-6 times per week.  Today's visit was #: 62 Starting weight: 256 lbs Starting date: 09/18/2018 Today's weight: 232 lbs Today's date: 03/06/2022 Total lbs lost to date: 24 lbs Total lbs lost since last in-office visit: 2  Interim History: Since last appointment [name] has had Covid--he was the only person at home with Covid. Felt this was somewhat helpful as it limited food options. Lunch is still a struggle and he mentions he is still trying to convince wife and daughter to eat at home. Going out of town next week to take care of his mother. He does not anticipate much struggle with staying on plan.  Subjective:   1. Other hyperlipidemia Willliam is on Fenofibrate. His last LDL 146, HDL 29, Trgly 238, and Total 219. No side effects of medication. Last labs in Nov 2022.  2. Vitamin D deficiency Cameron Valenzuela is currently taking prescription Vit D 50,000 IU once a week. Denies any nausea, vomiting or muscle weakness.He notes fatigue.  Assessment/Plan:   1. Other hyperlipidemia We will refill Fenofibrate 45 mcg by mouth daily for 1 month with 0 refills. We will obtain labs at next appointment.  -Refill Choline Fenofibrate (FENOFIBRIC ACID) 45 MG CPDR; Take 1 tablet by mouth daily.  Dispense: 30 capsule; Refill: 0  2. Vitamin D deficiency Will obtain labs at next appointment.  3. Obesity with current BMI of 33.3 [name] is currently in the action stage of change. As such, his goal is to continue with weight loss efforts. He has agreed to the Category 4 Plan.   Exercise goals: All adults should avoid inactivity. Some physical activity is better than none, and adults who  participate in any amount of physical activity gain some health benefits.  Behavioral modification strategies: increasing lean protein intake and planning for success.  Cameron Valenzuela has agreed to follow-up with our clinic in 4 weeks. He was informed of the importance of frequent follow-up visits to maximize his success with intensive lifestyle modifications for his multiple health conditions.   Objective:   Blood pressure 128/75, pulse 84, temperature 98.2 F (36.8 C), height '5\' 10"'$  (1.778 m), weight 232 lb (105.2 kg), SpO2 98 %. Body mass index is 33.29 kg/m.  General: Cooperative, alert, well developed, in no acute distress. HEENT: Conjunctivae and lids unremarkable. Cardiovascular: Regular rhythm.  Lungs: Normal work of breathing. Neurologic: No focal deficits.   Lab Results  Component Value Date   CREATININE 0.95 10/18/2021   BUN 18 10/18/2021   NA 140 10/18/2021   K 4.0 10/18/2021   CL 104 10/18/2021   CO2 28 10/18/2021   Lab Results  Component Value Date   ALT 37 10/18/2021   AST 29 10/18/2021   ALKPHOS 69 10/18/2021   BILITOT 0.3 10/18/2021   Lab Results  Component Value Date   HGBA1C 5.4 10/18/2021   HGBA1C 5.6 05/17/2021   HGBA1C 5.4 05/10/2020   HGBA1C 5.2 12/24/2019   HGBA1C 5.1 07/27/2019   Lab Results  Component Value Date   INSULIN 45.5 (H) 05/17/2021   INSULIN 25.0 (H) 05/10/2020   INSULIN 18.7 12/24/2019   INSULIN 22.7 07/27/2019   INSULIN 16.4 02/11/2019   Lab Results  Component Value Date   TSH 3.25 10/18/2021   Lab Results  Component Value Date   CHOL 219 (H) 05/17/2021   HDL 29 (L) 05/17/2021   LDLCALC 146 (H) 05/17/2021   LDLDIRECT 116.0 06/24/2018   TRIG 238 (H) 05/17/2021   CHOLHDL 8 06/24/2018   Lab Results  Component Value Date   VD25OH 25.5 (L) 05/17/2021   VD25OH 38.5 05/10/2020   VD25OH 44.3 12/24/2019   Lab Results  Component Value Date   WBC 5.3 10/18/2021   HGB 13.4 10/18/2021   HCT 39.7 10/18/2021   MCV 88.6  10/18/2021   PLT 160.0 10/18/2021   No results found for: "IRON", "TIBC", "FERRITIN"  Attestation Statements:   Reviewed by clinician on day of visit: allergies, medications, problem list, medical history, surgical history, family history, social history, and previous encounter notes.  I, Elnora Morrison, RMA am acting as transcriptionist for Coralie Common, MD.  I have reviewed the above documentation for accuracy and completeness, and I agree with the above. - Coralie Common, MD

## 2022-04-09 ENCOUNTER — Encounter (INDEPENDENT_AMBULATORY_CARE_PROVIDER_SITE_OTHER): Payer: Self-pay | Admitting: Family Medicine

## 2022-04-09 ENCOUNTER — Ambulatory Visit (INDEPENDENT_AMBULATORY_CARE_PROVIDER_SITE_OTHER): Payer: 59 | Admitting: Family Medicine

## 2022-04-09 VITALS — BP 130/74 | HR 84 | Temp 98.2°F | Ht 70.0 in | Wt 234.0 lb

## 2022-04-09 DIAGNOSIS — R748 Abnormal levels of other serum enzymes: Secondary | ICD-10-CM

## 2022-04-09 DIAGNOSIS — Z6833 Body mass index (BMI) 33.0-33.9, adult: Secondary | ICD-10-CM

## 2022-04-09 DIAGNOSIS — E559 Vitamin D deficiency, unspecified: Secondary | ICD-10-CM

## 2022-04-09 DIAGNOSIS — R7303 Prediabetes: Secondary | ICD-10-CM | POA: Diagnosis not present

## 2022-04-09 DIAGNOSIS — E7849 Other hyperlipidemia: Secondary | ICD-10-CM

## 2022-04-09 DIAGNOSIS — F3289 Other specified depressive episodes: Secondary | ICD-10-CM

## 2022-04-09 DIAGNOSIS — E669 Obesity, unspecified: Secondary | ICD-10-CM

## 2022-04-09 MED ORDER — FENOFIBRIC ACID 45 MG PO CPDR: 1.0000 | DELAYED_RELEASE_CAPSULE | Freq: Every day | ORAL | 0 refills | Status: DC

## 2022-04-09 MED ORDER — METFORMIN HCL 500 MG PO TABS: 500.0000 mg | ORAL_TABLET | Freq: Two times a day (BID) | ORAL | 0 refills | Status: DC

## 2022-04-09 MED ORDER — BUPROPION HCL ER (SR) 150 MG PO TB12: ORAL_TABLET | ORAL | 0 refills | Status: DC

## 2022-04-10 LAB — INSULIN, RANDOM: INSULIN: 50.2 u[IU]/mL — ABNORMAL HIGH (ref 2.6–24.9)

## 2022-04-10 LAB — COMPREHENSIVE METABOLIC PANEL
ALT: 46 IU/L — ABNORMAL HIGH (ref 0–44)
AST: 30 IU/L (ref 0–40)
Albumin/Globulin Ratio: 2.1 (ref 1.2–2.2)
Albumin: 4.5 g/dL (ref 3.8–4.9)
Alkaline Phosphatase: 73 IU/L (ref 44–121)
BUN/Creatinine Ratio: 18 (ref 9–20)
BUN: 17 mg/dL (ref 6–24)
Bilirubin Total: 0.4 mg/dL (ref 0.0–1.2)
CO2: 22 mmol/L (ref 20–29)
Calcium: 8.7 mg/dL (ref 8.7–10.2)
Chloride: 106 mmol/L (ref 96–106)
Creatinine, Ser: 0.95 mg/dL (ref 0.76–1.27)
Globulin, Total: 2.1 g/dL (ref 1.5–4.5)
Glucose: 103 mg/dL — ABNORMAL HIGH (ref 70–99)
Potassium: 4.6 mmol/L (ref 3.5–5.2)
Sodium: 142 mmol/L (ref 134–144)
Total Protein: 6.6 g/dL (ref 6.0–8.5)
eGFR: 96 mL/min/{1.73_m2} (ref >59)

## 2022-04-10 LAB — HEMOGLOBIN A1C
Est. average glucose Bld gHb Est-mCnc: 111 mg/dL
Hgb A1c MFr Bld: 5.5 % (ref 4.8–5.6)

## 2022-04-10 LAB — LIPID PANEL WITH LDL/HDL RATIO
Cholesterol, Total: 195 mg/dL (ref 100–199)
HDL: 27 mg/dL — ABNORMAL LOW (ref >39)
LDL Chol Calc (NIH): 130 mg/dL — ABNORMAL HIGH (ref 0–99)
LDL/HDL Ratio: 4.8 ratio — ABNORMAL HIGH (ref 0.0–3.6)
Triglycerides: 213 mg/dL — ABNORMAL HIGH (ref 0–149)
VLDL Cholesterol Cal: 38 mg/dL (ref 5–40)

## 2022-04-10 LAB — VITAMIN D 25 HYDROXY (VIT D DEFICIENCY, FRACTURES): Vit D, 25-Hydroxy: 33.6 ng/mL (ref 30.0–100.0)

## 2022-04-11 NOTE — Progress Notes (Unsigned)
Chief Complaint:   OBESITY Cameron Valenzuela is here to discuss his progress with his obesity treatment plan along with follow-up of his obesity related diagnoses. Cameron Valenzuela is on the Category 4 Plan and states he is following his eating plan approximately 50-60% of the time. Cameron Valenzuela states he is walking 10-15,000 steps 5-6 times per week.  Today's visit was #: 31 Starting weight: 256 lbs Starting date: 09/18/2018 Today's weight: 234 lbs Today's date: 04/09/2022 Total lbs lost to date: 22 lbs Total lbs lost since last in-office visit: 0  Interim History: Cameron Valenzuela has had an inspection at work, he's been stressed about. Has tried to stick to plan as closely as he could. Has been slightly less mindful and in control. Anticipating an easier next few weeks. No anticipated travel or events.   Subjective:   1. Other hyperlipidemia [place]'s on Fenofibrate. Last Trigly 238.  2. Vitamin D deficiency Cameron Valenzuela is currently taking prescription Vit D 50,000 IU once a week. Denies any nausea, vomiting or muscle weakness. He notes fatigue.  3. Elevated liver enzymes Cameron Valenzuela's last LFTs within normal limits but 1 year ago elevated.  4. Prediabetes Cameron Valenzuela's last A1c 5.4. He is on Metformin twice a day.  5. Other depression with emotional eating Cameron Valenzuela is on Wellbutrin. Denies suicidal ideas, and homicidal ideas.He is doing well controlling craving.  Assessment/Plan:   1. Other hyperlipidemia We will obtain labs today. We will refill Fenofibrate 45 mg daily for 1 month with 0 refills.  - Lipid Panel With LDL/HDL Ratio  -Refill Choline Fenofibrate (FENOFIBRIC ACID) 45 MG CPDR; Take 1 tablet by mouth daily.  Dispense: 30 capsule; Refill: 0  2. Vitamin D deficiency We will obtain labs today.  - VITAMIN D 25 Hydroxy (Vit-D Deficiency, Fractures)  3. Elevated liver enzymes We will obtain labs today.  - Comprehensive metabolic panel  4. Prediabetes We will obtain labs today. We will refill  Metformin 500 mg by mouth twice a day for 1 month with 0 refills.  - Insulin, random - Hemoglobin A1c  -Refill metFORMIN (GLUCOPHAGE) 500 MG tablet; Take 1 tablet (500 mg total) by mouth 2 (two) times daily with a meal.  Dispense: 60 tablet; Refill: 0  5. Other depression with emotional eating We will refill Wellbutrin SR 150 mg by mouth daily for 1 month with 0 refills.  -Refill buPROPion (WELLBUTRIN SR) 150 MG 12 hr tablet; 1 po BID  Dispense: 180 tablet; Refill: 0  6. Obesity with current BMI of 33.7 Cameron Valenzuela is currently in the action stage of change. As such, his goal is to continue with weight loss efforts. He has agreed to the Category 4 Plan.   Exercise goals: All adults should avoid inactivity. Some physical activity is better than none, and adults who participate in any amount of physical activity gain some health benefits.  Behavioral modification strategies: increasing lean protein intake, meal planning and cooking strategies, keeping healthy foods in the home, and planning for success.  Sammy has agreed to follow-up with our clinic in 4 weeks. He was informed of the importance of frequent follow-up visits to maximize his success with intensive lifestyle modifications for his multiple health conditions.   Cameron Valenzuela was informed we would discuss his lab results at his next visit unless there is a critical issue that needs to be addressed sooner. Cameron Valenzuela agreed to keep his next visit at the agreed upon time to discuss these results.  Objective:   Blood pressure 130/74, pulse 84, temperature 98.2 F (  36.8 C), height '5\' 10"'$  (1.778 m), weight 234 lb (106.1 kg). Body mass index is 33.58 kg/m.  General: Cooperative, alert, well developed, in no acute distress. HEENT: Conjunctivae and lids unremarkable. Cardiovascular: Regular rhythm.  Lungs: Normal work of breathing. Neurologic: No focal deficits.   Lab Results  Component Value Date   CREATININE 0.95 04/09/2022   BUN 17  04/09/2022   NA 142 04/09/2022   K 4.6 04/09/2022   CL 106 04/09/2022   CO2 22 04/09/2022   Lab Results  Component Value Date   ALT 46 (H) 04/09/2022   AST 30 04/09/2022   ALKPHOS 73 04/09/2022   BILITOT 0.4 04/09/2022   Lab Results  Component Value Date   HGBA1C 5.5 04/09/2022   HGBA1C 5.4 10/18/2021   HGBA1C 5.6 05/17/2021   HGBA1C 5.4 05/10/2020   HGBA1C 5.2 12/24/2019   Lab Results  Component Value Date   INSULIN 50.2 (H) 04/09/2022   INSULIN 45.5 (H) 05/17/2021   INSULIN 25.0 (H) 05/10/2020   INSULIN 18.7 12/24/2019   INSULIN 22.7 07/27/2019   Lab Results  Component Value Date   TSH 3.25 10/18/2021   Lab Results  Component Value Date   CHOL 195 04/09/2022   HDL 27 (L) 04/09/2022   LDLCALC 130 (H) 04/09/2022   LDLDIRECT 116.0 06/24/2018   TRIG 213 (H) 04/09/2022   CHOLHDL 8 06/24/2018   Lab Results  Component Value Date   VD25OH 33.6 04/09/2022   VD25OH 25.5 (L) 05/17/2021   VD25OH 38.5 05/10/2020   Lab Results  Component Value Date   WBC 5.3 10/18/2021   HGB 13.4 10/18/2021   HCT 39.7 10/18/2021   MCV 88.6 10/18/2021   PLT 160.0 10/18/2021   No results found for: "IRON", "TIBC", "FERRITIN"  Attestation Statements:   Reviewed by clinician on day of visit: allergies, medications, problem list, medical history, surgical history, family history, social history, and previous encounter notes.  I, Elnora Morrison, RMA am acting as transcriptionist for Coralie Common, MD. I have reviewed the above documentation for accuracy and completeness, and I agree with the above. - Coralie Common, MD

## 2022-05-11 ENCOUNTER — Telehealth: Payer: Self-pay | Admitting: Internal Medicine

## 2022-05-11 ENCOUNTER — Emergency Department (HOSPITAL_BASED_OUTPATIENT_CLINIC_OR_DEPARTMENT_OTHER)
Admission: EM | Admit: 2022-05-11 | Discharge: 2022-05-11 | Disposition: A | Discharge status: Home / Self Care | Payer: 59 | Attending: Emergency Medicine | Admitting: Emergency Medicine

## 2022-05-11 ENCOUNTER — Other Ambulatory Visit: Payer: Self-pay

## 2022-05-11 ENCOUNTER — Encounter (HOSPITAL_BASED_OUTPATIENT_CLINIC_OR_DEPARTMENT_OTHER): Payer: Self-pay | Admitting: Emergency Medicine

## 2022-05-11 ENCOUNTER — Emergency Department (HOSPITAL_BASED_OUTPATIENT_CLINIC_OR_DEPARTMENT_OTHER): Payer: 59

## 2022-05-11 DIAGNOSIS — S0990XA Unspecified injury of head, initial encounter: Secondary | ICD-10-CM

## 2022-05-11 DIAGNOSIS — W182XXA Fall in (into) shower or empty bathtub, initial encounter: Secondary | ICD-10-CM | POA: Diagnosis not present

## 2022-05-11 DIAGNOSIS — W19XXXA Unspecified fall, initial encounter: Secondary | ICD-10-CM

## 2022-05-11 NOTE — ED Notes (Signed)
Denies any gait issues, denies any numbness or tingling of extremities, denies any dizziness when lying, sitting or standing. MAE x 4

## 2022-05-11 NOTE — ED Provider Notes (Signed)
[facility] EMERGENCY DEPARTMENT Provider Note   CSN: [id number] Arrival date & time: 05/11/22  1113     History  Chief Complaint  Patient presents with   Lytle Michaels    Cameron Valenzuela is a 51 y.o. male.  Is a 51 year old male with no significant past medical history presenting to the emergency department with a head injury.  Patient states that he slipped and fell in the shower this morning and hit the back of his head.  He states he is unsure if he lost consciousness but this was followed by a period of confusion.  He states that he had to look at his watch to figure out what the date was and could not remember if he had taken any Tylenol for his headache.  He states that he is having some light sensitivity but denies any vision changes.  He denies any nausea, vomiting, numbness or weakness.  He states he is having some mild pain on the right side of his neck and he states his headache has been moving around his head.  He denies any blood thinner use.  The history is provided by the patient and the spouse.  Fall       Home Medications Prior to Admission medications   Medication Sig Start Date End Date Taking? Authorizing Provider  buPROPion [facility] SR) 150 MG 12 hr tablet 1 po BID 04/09/22   Laqueta Linden, MD  Choline Fenofibrate (FENOFIBRIC ACID) 45 MG CPDR Take 1 tablet by mouth daily. 04/09/22   Laqueta Linden, MD  Fluorouracil (TOLAK) 4 % CREA Apply to affected area qhs Monday - Sunday x 2 weeks- expect irritation & avoid sunlight 08/08/21   Warren Danes, PA-C  metFORMIN (GLUCOPHAGE) 500 MG tablet Take 1 tablet (500 mg total) by mouth 2 (two) times daily with a meal. 04/09/22   Laqueta Linden, MD  Vitamin D, Ergocalciferol, (DRISDOL) 1.25 MG (50000 UNIT) CAPS capsule Take 1 capsule (50,000 Units total) by mouth every 7 (seven) days. 12/18/21   Laqueta Linden, MD      Allergies    Patient has no known allergies.    Review of Systems    Review of Systems  Physical Exam Updated Vital Signs BP 120/76   Pulse 73   Temp 98.4 F (36.9 C) (Oral)   Resp 14   Ht '5\' 10"'$  (1.778 m)   Wt 108.9 kg   SpO2 100%   BMI 34.44 kg/m  Physical Exam Vitals and nursing note reviewed.  Constitutional:      General: He is not in acute distress.    Appearance: Normal appearance.  HENT:     Head: Normocephalic and atraumatic.     Nose: Nose normal.     Mouth/Throat:     Mouth: Mucous membranes are moist.     Pharynx: Oropharynx is clear.  Eyes:     Extraocular Movements: Extraocular movements intact.     Conjunctiva/sclera: Conjunctivae normal.     Pupils: Pupils are equal, round, and reactive to light.  Neck:     Comments: No midline neck tenderness Cardiovascular:     Rate and Rhythm: Normal rate and regular rhythm.     Pulses: Normal pulses.     Heart sounds: Normal heart sounds.  Pulmonary:     Effort: Pulmonary effort is normal.     Breath sounds: Normal breath sounds.  Abdominal:     General: Abdomen is flat.     Palpations: Abdomen  is soft.     Tenderness: There is no abdominal tenderness.  Musculoskeletal:        General: No signs of injury. Normal range of motion.     Cervical back: Normal range of motion and neck supple.     Comments: No midline back tenderness  Skin:    General: Skin is warm and dry.     Findings: No bruising.     Comments: No abrasion or laceration  Neurological:     General: No focal deficit present.     Mental Status: He is alert and oriented to person, place, and time.     Cranial Nerves: No cranial nerve deficit.     Sensory: No sensory deficit.     Motor: No weakness.     Coordination: Coordination normal.  Psychiatric:        Mood and Affect: Mood normal.        Behavior: Behavior normal.     ED Results / Procedures / Treatments   Labs (all labs ordered are listed, but only abnormal results are displayed) Labs Reviewed - No data to display  EKG None  Radiology CT Head  Wo Contrast  Result Date: 05/11/2022 CLINICAL DATA:  Provided history: Head trauma, moderate/severe. Neck trauma, midline tenderness. Additional history provided: Fall, head injury, headache, light sensitivity, neck pain. EXAM: CT HEAD WITHOUT CONTRAST CT CERVICAL SPINE WITHOUT CONTRAST TECHNIQUE: Multidetector CT imaging of the head and cervical spine was performed following the standard protocol without intravenous contrast. Multiplanar CT image reconstructions of the cervical spine were also generated. RADIATION DOSE REDUCTION: This exam was performed according to the departmental dose-optimization program which includes automated exposure control, adjustment of the mA and/or kV according to patient size and/or use of iterative reconstruction technique. COMPARISON:  Head CT 06/19/2017. Cervical spine radiographs 04/30/2018 FINDINGS: CT HEAD FINDINGS Brain: Cerebral volume is normal. There is no acute intracranial hemorrhage. No demarcated cortical infarct. No extra-axial fluid collection. No evidence of an intracranial mass. No midline shift. Vascular: No hyperdense vessel. Skull: No fracture or aggressive osseous lesion. Sinuses/Orbits: No mass or acute finding within the imaged orbits. Trace mucosal thickening within the bilateral ethmoid and right maxillary sinuses. CT CERVICAL SPINE FINDINGS Alignment: Straightening of the expected cervical lordosis. No significant spondylolisthesis. Skull base and vertebrae: The basion-dental and atlanto-dental intervals are maintained.No evidence of acute fracture to the cervical spine. Soft tissues and spinal canal: No prevertebral fluid or swelling. No visible canal hematoma. Disc levels: Cervical spondylosis. Mild-to-moderate disc space narrowing at C5-C6. Multilevel disc bulges/central disc protrusions, endplate spurring and uncovertebral hypertrophy. Multilevel spinal canal narrowing. Most notably at C5-C6, a disc bulge contributes to at least moderate spinal canal  stenosis. Multilevel bony neural foraminal narrowing, greatest bilaterally at C5-C6. Upper chest: No consolidation within the imaged lung apices. No visible pneumothorax. IMPRESSION: CT head: No evidence of acute intracranial abnormality. CT cervical spine: 1. No evidence of acute fracture to the cervical spine. 2. Nonspecific straightening of the expected cervical lordosis. 3. Cervical spondylosis, as described and greatest at C5-C6. Electronically Signed   By: Kellie Simmering D.O.   On: 05/11/2022 13:00   CT Cervical Spine Wo Contrast  Result Date: 05/11/2022 CLINICAL DATA:  Provided history: Head trauma, moderate/severe. Neck trauma, midline tenderness. Additional history provided: Fall, head injury, headache, light sensitivity, neck pain. EXAM: CT HEAD WITHOUT CONTRAST CT CERVICAL SPINE WITHOUT CONTRAST TECHNIQUE: Multidetector CT imaging of the head and cervical spine was performed following the standard protocol without intravenous  contrast. Multiplanar CT image reconstructions of the cervical spine were also generated. RADIATION DOSE REDUCTION: This exam was performed according to the departmental dose-optimization program which includes automated exposure control, adjustment of the mA and/or kV according to patient size and/or use of iterative reconstruction technique. COMPARISON:  Head CT 06/19/2017. Cervical spine radiographs 04/30/2018 FINDINGS: CT HEAD FINDINGS Brain: Cerebral volume is normal. There is no acute intracranial hemorrhage. No demarcated cortical infarct. No extra-axial fluid collection. No evidence of an intracranial mass. No midline shift. Vascular: No hyperdense vessel. Skull: No fracture or aggressive osseous lesion. Sinuses/Orbits: No mass or acute finding within the imaged orbits. Trace mucosal thickening within the bilateral ethmoid and right maxillary sinuses. CT CERVICAL SPINE FINDINGS Alignment: Straightening of the expected cervical lordosis. No significant spondylolisthesis.  Skull base and vertebrae: The basion-dental and atlanto-dental intervals are maintained.No evidence of acute fracture to the cervical spine. Soft tissues and spinal canal: No prevertebral fluid or swelling. No visible canal hematoma. Disc levels: Cervical spondylosis. Mild-to-moderate disc space narrowing at C5-C6. Multilevel disc bulges/central disc protrusions, endplate spurring and uncovertebral hypertrophy. Multilevel spinal canal narrowing. Most notably at C5-C6, a disc bulge contributes to at least moderate spinal canal stenosis. Multilevel bony neural foraminal narrowing, greatest bilaterally at C5-C6. Upper chest: No consolidation within the imaged lung apices. No visible pneumothorax. IMPRESSION: CT head: No evidence of acute intracranial abnormality. CT cervical spine: 1. No evidence of acute fracture to the cervical spine. 2. Nonspecific straightening of the expected cervical lordosis. 3. Cervical spondylosis, as described and greatest at C5-C6. Electronically Signed   By: Kellie Simmering D.O.   On: 05/11/2022 13:00    Procedures Procedures    Medications Ordered in ED Medications - No data to display  ED Course/ Medical Decision Making/ A&P                           Medical Decision Making This patient presents to the ED with chief complaint(s) of head injury with no pertinent past medical history which further complicates the presenting complaint. The complaint involves an extensive differential diagnosis and also carries with it a high risk of complications and morbidity.    The differential diagnosis includes patient is low risk by [place] head CT and C-spine rules making ICH or mass effect less likely, considering possible concussion, no evidence of any laceration or abrasion, no other evidence of traumatic injuries on exam  Additional history obtained: Additional history obtained from family Records reviewed N/A  ED Course and Reassessment: Patient was initially evaluated in  triage and had CT head and C-spine performed that showed no acute traumatic injury.  Patient is well-appearing on my exam without any acute focal neurologic deficits and his confusion has resolved.  He may have a mild concussion.  He took Tylenol prior to arrival and declined Motrin at this time.  He was recommended primary care follow-up and was given strict return precautions.  Independent labs interpretation:  N/A  Independent visualization of imaging: - I independently visualized the following imaging with scope of interpretation limited to determining acute life threatening conditions related to emergency care: CT head/C-spine, which revealed no acute traumatic injuries  Consultation: - Consulted or discussed management/test interpretation w/ external professional: N/A  Consideration for admission or further workup: As no emergent conditions requiring admission or further work-up at this time and is stable for discharge with primary care follow-up Social Determinants of health: N/A  Final Clinical Impression(s) / ED Diagnoses Final diagnoses:  Fall, initial encounter  Injury of head, initial encounter    Rx / DC Orders ED Discharge Orders     None         Kemper Durie, DO 05/11/22 1527

## 2022-05-11 NOTE — Discharge Instructions (Signed)
You were seen in the emergency department after your fall.  Your imaging showed no signs of bleeding in your brain and no broken bones.  You may have a mild concussion and you should try to rest your head by limiting your screen time today and making sure you are getting plenty of rest.  You can return to your activities as your symptoms allow.  You can take Tylenol and Motrin as needed for your headaches, both can be taken up to every 6 hours.  You should follow-up with your primary doctor in the next few days to have your symptoms rechecked.  You should return to the emergency department if you have significantly worsening headaches, repetitive vomiting, being confusion, your difficult to wake up, you have a seizure or if you have any other new or concerning symptoms.

## 2022-05-11 NOTE — ED Triage Notes (Addendum)
Tripped  in bathroom , fell backward , head injury , neck pan . No loc yet confusion after the fall , alert and orientated at this time .  Ambulatory to triage , steady gait .  No blood thinner intake

## 2022-05-11 NOTE — Telephone Encounter (Signed)
Pt at ED.

## 2022-05-11 NOTE — Telephone Encounter (Signed)
Nurse Assessment Nurse: Tito Dine, RN, Neoma Laming Date/Time Eilene Ghazi Time): 05/11/2022 9:03:40 AM Confirm and document reason for call. If symptomatic, describe symptoms. ---Caller states he slipped getting out of the shower this morning and hit his head. Caller states he had some confusion and headache, Caller states he would like to see about getting seen today. Denies LOC, Bleeding or open injury. Does the patient have any new or worsening symptoms? ---Yes Will a triage be completed? ---Yes Related visit to physician within the last 2 weeks? ---No Does the PT have any chronic conditions? (i.e. diabetes, asthma, this includes High risk factors for pregnancy, etc.) ---No Is this a behavioral health or substance abuse call? ---No Guidelines Guideline Title Affirmed Question Affirmed Notes Nurse Date/Time (Eastern Time) Head Injury [1] ACUTE NEURO SYMPTOM AND [2] now fine (DEFINITION: difficult to awaken OR confused thinking and talking OR Tito Dine, RN, Neoma Laming 05/11/2022 9:05:51 AM PLEASE NOTE: All timestamps contained within this report are represented as [place] Standard Time. CONFIDENTIALTY NOTICE: This fax transmission is intended only for the addressee. It contains information that is legally privileged, confidential or otherwise protected from use or disclosure. If you are not the intended recipient, you are strictly prohibited from reviewing, disclosing, copying using or disseminating any of this information or taking any action in reliance on or regarding this information. If you have received this fax in error, please notify us immediately by telephone so that we can arrange for its return to [place]. Phone: [phone], Toll-Free: [phone], Fax: [phone] Page: 2 of 2 Call Id: [id number] Guidelines Guideline Title Affirmed Question Affirmed Notes Nurse Date/Time Eilene Ghazi Time) slurred speech OR weakness of arms OR unsteady walking) Disp. Time  Eilene Ghazi Time) Disposition Final User 05/11/2022 9:01:52 AM Send to Urgent Ronie Spies, [place] 05/11/2022 9:11:43 AM Go to ED Now (or PCP triage) Yes Tito Dine, RN, Deborah Final Disposition 05/11/2022 9:11:43 AM Go to ED Now (or PCP triage) Yes Tito Dine, RN, Garrel Ridgel Disagree/Comply Comply Caller Understands Yes PreDisposition Call Doctor Care Advice Given Per Guideline GO TO ED NOW (OR PCP TRIAGE): * IF NO PCP (PRIMARY CARE PROVIDER) SECOND-LEVEL TRIAGE: You need to be seen within the next hour. Go to the [facility] at _____________ [address] as soon as you can. ANOTHER ADULT SHOULD DRIVE: * It is better and safer if another adult drives instead of you. CARE ADVICE given per Head Injury (Adult) guideline. Referrals [facility] - ED

## 2022-05-11 NOTE — ED Notes (Signed)
Family states client fell in bathroom and hit his head, no hx of blood thinners, was able to ambulate to exam room 7 without assistance, gait very steady, GCS 15

## 2022-05-11 NOTE — Telephone Encounter (Signed)
Pt called stating that he had a fall and hit his head this morning and was experiencing the following symptoms:  -Headache  -No lightheadedness  -No Passing/Blacking Out   Pt was transferred to triage nurse per Tierra's recommendation for further eval.

## 2022-05-22 ENCOUNTER — Ambulatory Visit (INDEPENDENT_AMBULATORY_CARE_PROVIDER_SITE_OTHER): Payer: 59 | Admitting: Family Medicine

## 2022-06-05 ENCOUNTER — Telehealth (INDEPENDENT_AMBULATORY_CARE_PROVIDER_SITE_OTHER): Payer: 59 | Admitting: Family Medicine

## 2022-06-05 ENCOUNTER — Encounter (INDEPENDENT_AMBULATORY_CARE_PROVIDER_SITE_OTHER): Payer: Self-pay | Admitting: Family Medicine

## 2022-06-05 ENCOUNTER — Ambulatory Visit (INDEPENDENT_AMBULATORY_CARE_PROVIDER_SITE_OTHER): Payer: 59 | Admitting: Physician Assistant

## 2022-06-05 DIAGNOSIS — F3289 Other specified depressive episodes: Secondary | ICD-10-CM

## 2022-06-05 DIAGNOSIS — E782 Mixed hyperlipidemia: Secondary | ICD-10-CM

## 2022-06-05 DIAGNOSIS — E88819 Insulin resistance, unspecified: Secondary | ICD-10-CM

## 2022-06-05 DIAGNOSIS — E559 Vitamin D deficiency, unspecified: Secondary | ICD-10-CM | POA: Diagnosis not present

## 2022-06-05 DIAGNOSIS — R7303 Prediabetes: Secondary | ICD-10-CM

## 2022-06-05 DIAGNOSIS — E7849 Other hyperlipidemia: Secondary | ICD-10-CM

## 2022-06-05 DIAGNOSIS — E669 Obesity, unspecified: Secondary | ICD-10-CM

## 2022-06-05 DIAGNOSIS — Z6833 Body mass index (BMI) 33.0-33.9, adult: Secondary | ICD-10-CM

## 2022-06-05 MED ORDER — BUPROPION HCL ER (SR) 150 MG PO TB12: ORAL_TABLET | ORAL | 0 refills | Status: DC

## 2022-06-05 MED ORDER — FENOFIBRIC ACID 45 MG PO CPDR: 1.0000 | DELAYED_RELEASE_CAPSULE | Freq: Every day | ORAL | 0 refills | Status: DC

## 2022-06-05 MED ORDER — VITAMIN D (ERGOCALCIFEROL) 1.25 MG (50000 UNIT) PO CAPS: 50000.0000 [IU] | ORAL_CAPSULE | ORAL | 0 refills | Status: DC

## 2022-06-05 MED ORDER — METFORMIN HCL 500 MG PO TABS: 500.0000 mg | ORAL_TABLET | Freq: Two times a day (BID) | ORAL | 0 refills | Status: DC

## 2022-06-19 NOTE — Progress Notes (Unsigned)
TeleHealth Visit:  Due to the COVID-19 pandemic, this visit was completed with telemedicine (audio/video) technology to reduce patient and provider exposure as well as to preserve personal protective equipment.   Cameron Valenzuela has verbally consented to this TeleHealth visit. The patient is located at home, the provider is located at the [facility] office. The participants in this visit include the listed provider and patient. The visit was conducted today via MyChart video.   Chief Complaint: OBESITY Cameron Valenzuela is here to discuss his progress with his obesity treatment plan along with follow-up of his obesity related diagnoses. Cameron Valenzuela is on the Category 4 Plan and states he is following his eating plan approximately 60% of the time. Cameron Valenzuela states he is walking 12,000 steps.   Today's visit was #: 61 Starting weight: 256 lbs Starting date: 09/18/2018  Interim History: Cameron Valenzuela continues to follow his category 4 plan and he feels he is doing well.  He is working on eating all of the food on his plan and he is getting good support at home.  Subjective:   1. Vitamin D deficiency Cameron Valenzuela has been out of of his vitamin D prescription.  His vitamin D level is slowly improving, but is not yet at goal.  2. Insulin resistance Cameron Valenzuela is doing well on metformin with no GI upset.  He is doing well with decreasing simple carbohydrates.  3. Mixed hyperlipidemia Cameron Valenzuela is on fenofibrate and he is also working on his weight loss to help improve his cholesterol.  He is not on a statin.  His last labs were not yet at goal.  4. Other depression with emotional eating Cameron Valenzuela is still struggling with some stress eating, but he is making improvements.  No side effects were noted on Wellbutrin.  Assessment/Plan:   1. Vitamin D deficiency We will refill prescription Vitamin D for 90 days. Cameron Valenzuela will follow-up for routine testing of Vitamin D, at least 2-3 times per year to avoid  over-replacement.  - Vitamin D, Ergocalciferol, (DRISDOL) 1.25 MG (50000 UNIT) CAPS capsule; Take 1 capsule (50,000 Units total) by mouth every 7 (seven) days.  Dispense: 12 capsule; Refill: 0  2. Insulin resistance We will refill metformin for 1 month. Cameron Valenzuela will continue to work on weight loss, exercise, and decreasing simple carbohydrates to help decrease the risk of diabetes. Cameron Valenzuela agreed to follow-up with [place] as directed to closely monitor his progress.  - metFORMIN (GLUCOPHAGE) 500 MG tablet; Take 1 tablet (500 mg total) by mouth 2 (two) times daily with a meal.  Dispense: 60 tablet; Refill: 0  3. Mixed hyperlipidemia We will refill fenofibric for 1 month.  Cameron Valenzuela will continue with his diet and exercise, and we will recheck labs in 2 to 3 months.  - Choline Fenofibrate (FENOFIBRIC ACID) 45 MG CPDR; Take 1 tablet by mouth daily.  Dispense: 30 capsule; Refill: 0  4. Other depression with emotional eating Cameron Valenzuela will continue Wellbutrin SR 150 mg twice daily, and we will refill for 90 days.  - buPROPion (WELLBUTRIN SR) 150 MG 12 hr tablet; 1 po BID  Dispense: 180 tablet; Refill: 0  5. Obesity with current BMI of 33.7 Cameron Valenzuela is currently in the action stage of change. As such, his goal is to continue with weight loss efforts. He has agreed to the Category 4 Plan.   Exercise goals: As is.   Behavioral modification strategies: holiday eating strategies .  Cameron Valenzuela has agreed to follow-up with our clinic in 6 weeks. He was  informed of the importance of frequent follow-up visits to maximize his success with intensive lifestyle modifications for his multiple health conditions.  Objective:   VITALS: Per patient if applicable, see vitals. GENERAL: Alert and in no acute distress. CARDIOPULMONARY: No increased WOB. Speaking in clear sentences.  PSYCH: Pleasant and cooperative. Speech normal rate and rhythm. Affect is appropriate. Insight and judgement are appropriate. Attention is  focused, linear, and appropriate.  NEURO: Oriented as arrived to appointment on time with no prompting.   Lab Results  Component Value Date   CREATININE 0.95 04/09/2022   BUN 17 04/09/2022   NA 142 04/09/2022   K 4.6 04/09/2022   CL 106 04/09/2022   CO2 22 04/09/2022   Lab Results  Component Value Date   ALT 46 (H) 04/09/2022   AST 30 04/09/2022   ALKPHOS 73 04/09/2022   BILITOT 0.4 04/09/2022   Lab Results  Component Value Date   HGBA1C 5.5 04/09/2022   HGBA1C 5.4 10/18/2021   HGBA1C 5.6 05/17/2021   HGBA1C 5.4 05/10/2020   HGBA1C 5.2 12/24/2019   Lab Results  Component Value Date   INSULIN 50.2 (H) 04/09/2022   INSULIN 45.5 (H) 05/17/2021   INSULIN 25.0 (H) 05/10/2020   INSULIN 18.7 12/24/2019   INSULIN 22.7 07/27/2019   Lab Results  Component Value Date   TSH 3.25 10/18/2021   Lab Results  Component Value Date   CHOL 195 04/09/2022   HDL 27 (L) 04/09/2022   LDLCALC 130 (H) 04/09/2022   LDLDIRECT 116.0 06/24/2018   TRIG 213 (H) 04/09/2022   CHOLHDL 8 06/24/2018   Lab Results  Component Value Date   VD25OH 33.6 04/09/2022   VD25OH 25.5 (L) 05/17/2021   VD25OH 38.5 05/10/2020   Lab Results  Component Value Date   WBC 5.3 10/18/2021   HGB 13.4 10/18/2021   HCT 39.7 10/18/2021   MCV 88.6 10/18/2021   PLT 160.0 10/18/2021   No results found for: "IRON", "TIBC", "FERRITIN"  Attestation Statements:   Reviewed by clinician on day of visit: allergies, medications, problem list, medical history, surgical history, family history, social history, and previous encounter notes.   I, Trixie Dredge, am acting as transcriptionist for Dennard Nip, MD.  I have reviewed the above documentation for accuracy and completeness, and I agree with the above. - Dennard Nip, MD

## 2022-06-21 ENCOUNTER — Other Ambulatory Visit (INDEPENDENT_AMBULATORY_CARE_PROVIDER_SITE_OTHER): Payer: Self-pay | Admitting: Family Medicine

## 2022-06-21 DIAGNOSIS — E88819 Insulin resistance, unspecified: Secondary | ICD-10-CM

## 2022-06-21 DIAGNOSIS — E782 Mixed hyperlipidemia: Secondary | ICD-10-CM

## 2022-07-30 ENCOUNTER — Ambulatory Visit: Payer: 59 | Admitting: Neurology

## 2022-08-09 ENCOUNTER — Ambulatory Visit: Payer: 59 | Admitting: Neurology

## 2022-10-26 ENCOUNTER — Encounter: Payer: 59 | Admitting: Internal Medicine

## 2022-11-12 ENCOUNTER — Encounter: Payer: 59 | Admitting: Internal Medicine

## 2022-11-26 ENCOUNTER — Encounter: Payer: Self-pay | Admitting: Neurology

## 2022-11-26 ENCOUNTER — Ambulatory Visit (INDEPENDENT_AMBULATORY_CARE_PROVIDER_SITE_OTHER): Payer: BC Managed Care – PPO | Admitting: Neurology

## 2022-11-26 VITALS — BP 124/61 | HR 78 | Ht 70.0 in | Wt 245.0 lb

## 2022-11-26 DIAGNOSIS — G4734 Idiopathic sleep related nonobstructive alveolar hypoventilation: Secondary | ICD-10-CM

## 2022-11-26 DIAGNOSIS — G4733 Obstructive sleep apnea (adult) (pediatric): Secondary | ICD-10-CM | POA: Insufficient documentation

## 2022-11-26 DIAGNOSIS — E669 Obesity, unspecified: Secondary | ICD-10-CM

## 2022-11-26 NOTE — Addendum Note (Signed)
Addended by: Melvyn Novas on: 11/26/2022 11:21 AM   Modules accepted: Orders

## 2022-11-26 NOTE — Progress Notes (Addendum)
Provider:  Melvyn Novas, MD  Primary Care Physician:  Wanda Plump, MD [address] [place] [address]     Referring Provider: Wanda Plump, Md [address] [place],  [place] [address]          Chief Complaint according to patient   Patient presents with:     New Patient (Initial Visit)           HISTORY OF PRESENT ILLNESS:  Cameron Valenzuela is a 52 y.o. male patient who is here for revisit 11/26/2022 for  CPAP follow up.  He remains a compliant user of CPAP, needs a HST to get a baseline established.  He has a new position, he commutes to [place], [place]. 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 He is pre diabetic.   Compliance data: Cameron Valenzuela is 19-day download shows 80% compliance and this has certainly been affected by his long commute and different lifestyle right now he is on a minimum pressure of 8 maximum pressure setting of 28 cm water was 2 cm EPR and his residual AHI is 5.4.  There is a half brother high air leak which may be related to a need to change mask and headgear.  He also has facial hair.  The 95th percentile pressure is 16.2 cm water.  No central apneas are emerging.  The need for a new CPAP is not in doubt but I like to have a baseline.   Last visit in January 2023, Follow-up for 2018 diagnosed obstructive sleep apnea with CPAP  Cameron Valenzuela. Valenzuela:  His CPAP is 53 years old, will be 5 by January 2024. He is a very compliant user, feels that sleep is more restorative and he has better days in regards to focus and endurance.  Epworth sleepiness score is now 3/ 24 points. No fatigue. Vit D and Wellbutrin, metformin, fenofibrate acid.  CPAP-Compliance was reviewed.     CPAP follow up on a yearly basis.  Cameron Valenzuela and  again there is a 97% compliance user of CPAP was 20 out of 30 days and 80% of these days over 4 hours consecutively.  His average use of CPAP at night is 6 hours 7 minutes he is using an AutoSet air sense 10 by [employer], minimum pressure is 7 maximum pressure 16 cmH2O there is 2 cm expiratory pressure relief.  The serial number is [id number].  His residual AHI was 4.9 /h today which is a little higher than I like and all residual apneas are obstructive in nature based on this I would like to offer to increase the maximum pressure from 16 to 18 cm.   His machine documents that at the 95th percentile he requires 15.5 cm water pressure at night to control his apnea and since his maximum pressure is set at 16 was 2 cm proximal expiratory pressure relief he is straddling very close to that maximum border.  There are also high air leaks which can lead to erroneous apnea counts.  There is no evidence of Cheyne-Stokes respiration.           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 Cameron Valenzuela was infected with coronavirus in May of 2020, but had a mild course.  He continued to use CPAP since June and he felt that CPAP actually may have helped him to overcome the symptoms of COVID-19.  I reviewed his download which shows that he is a very compliant patient he has used the machine 20 out of 30 days and 26 of those days over 4 hours reaching a compliance of 87%.  The average user time is 6 hours 3 minutes uses an AutoSet between a minimum pressure of 7 maximum pressure of 13 cmH2O and 3 cm EPR however his residual AHI reflects an apnea index of 9.8/h of which 9.1 are obstructive in nature.  His 95th percentile pressure need is 12.9 cmH2O straddling the upper setting.  I would like to increase his maximum pressure from 13-16 cmH2O there has been no major air leakage so his settings and his supplies do not have to be changed otherwise.         CD/Interval  history from 28 July 2018.  I have the pleasure of meeting Cameron Valenzuela today a 52 year old established CPAP patient in our practice who is followed for his primary care needs by Dr. Porfirio Oar, MD.  He was last seen in April 2019 with the first compliance data gathered from his auto titration device after he underwent a sleep study and was diagnosed with sleep apnea.  His current AutoSet pressures between 7 and 13 cmH2O, with an expiratory pressure relief of 3 cmH2O.  He has a high snoring index while using CPAP which is unusual.  His apnea index is 2.7, his hypopnea index 3.6 but he has an RDI of 2.5 on top of these 2 data.  The 95th percentile pressure is 13 cmH2O as prescribed, the residual AHI total is 6.3 and a little bit too high.           UPDATE 4/15/2019[name], 52 year old male returns for follow-up with newly diagnosed obstructive sleep apnea here for initial CPAP compliance.  He denies difficulty adjusting to the machine.  Compliance data dated 09/24/2017-10/23/2017 shows compliance data greater than 4 hours at 97%.  Average usage 6 hours 8 minutes.  Set pressure 7-12 cm.  EPR level 3.  AHI 6.9.  ESS 4. FSS 36.  He returns for reevaluation. PLAN: CPAP compliance 97% Due to AHI of 6.9 we will increase max pressure slightly to 13 cm  Follow-up in 4 months for repeat compliance Nilda Riggs, GNP, [facility], APRN       11/5/18CDTimothy Kathie Rhodes Valenzuela is a 52 y.o. male , seen here as in a referral from Dr. Drue Valenzuela for snoring and witnessed apnea following a medical procedure under anesthesia.  Chief complaint according to patient : "I am not excessively tired or fatigued during the day, but the reported apnea is worrisome"   Cameron Valenzuela is a Caucasian 52 year old married gentleman who presents following 2 lithotripsies under anesthesia- after each apnea had been noticed by medical staff. The first procedure took place in May,  the second in August 2018. The patient also reports that  his body mass index and weight has fluctuated vastly, currently being at a higher end.  Last summer he lost about 35 pounds over a period of only 3 months while working out of state.  This came back with an additional weight gain, now at 240 pounds. Other diagnosis were listed below.  Review of Systems: Out of a complete 14 system review, the patient complains of only the following symptoms, and all other reviewed systems are negative.:  Fatigue, sleepiness , snoring, fragmented sleep, Insomnia, RLS, Nocturia    How likely are you to doze in the following situations: 0 = not likely, 1 = slight chance, 2 = moderate chance, 3 = high chance   Sitting and Reading? Watching Television? Sitting inactive in a public place (theater or meeting)? As a passenger in a car for an hour without a break? Lying down in the afternoon when circumstances permit? Sitting and talking to someone? Sitting quietly after lunch without alcohol? In a car, while stopped for a few minutes in traffic?   Total = 6/ 24 points   FSS endorsed at 27/ 63 points.   ROS: Fingers are more stiff in the morning, nerve impingement in the neck affecting the left hand 3 fingers felt numb. Numbness in fingertips affects fine motor.  He used to habve a similar feeling on the left and had rehab/ PT.  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  Tobacco Use   Smoking status: Never   Smokeless tobacco: Never  Vaping Use   Vaping Use: Never used  Substance and Sexual Activity   Alcohol use: Not Currently    Comment: rarely   Drug use: No   Sexual activity: Yes  Other Topics Concern   Not on file  Social History Narrative   Household-- pt, wife, child     (mother in law Jearld Adjutant, passed away 17-Dec-2015)    Social Determinants of Health   Financial Resource Strain: Not on file  Food Insecurity: Not on file  Transportation Needs: Not on file  Physical Activity: Not on file  Stress: Not on file  Social Connections: Not on file    Family History  Problem Relation Age of Onset   Hyperlipidemia Mother        M and others   Cancer Mother    Breast cancer Mother        dx early 44s   Diabetes Father    COPD Father    Heart disease Father        ?   Stroke Father        70   Sleep apnea Father    Obesity Father    Ovarian cancer Maternal Grandmother 50   Heart disease Maternal Grandfather    Heart disease Paternal Grandmother    Diabetes Other        many fam members    Colon cancer Neg Hx    Prostate cancer Neg Hx    Colon polyps Neg Hx    Esophageal cancer Neg Hx    Rectal cancer Neg Hx    Stomach cancer Neg Hx     Valenzuela Medical History:  Diagnosis Date   Allergic rhinitis    Allergy    seasonal   Family history of adverse reaction to anesthesia    mother slow to wake up    Family history of breast cancer    Family history of ovarian cancer    Fatigue    History of kidney stones    Kidney stones    Knee pain, right  Lower extremity edema    Pinched nerve in neck    Rosacea    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 Vernie Ammons   CYSTOSCOPY WITH STENT PLACEMENT Left 02/18/2017   Procedure: CYSTOSCOPY/ LEFT RETROGRADE/ WITH LEFT STENT PLACEMENT;  Surgeon: Ihor Gully, MD;  Location: [facility];  Service: Urology;  Laterality: Left;   EXTRACORPOREAL SHOCK WAVE LITHOTRIPSY Left 02/18/2017   Procedure: LEFT EXTRACORPOREAL SHOCK WAVE LITHOTRIPSY (ESWL);  Surgeon: Marcine Matar, MD;  Location: [facility];  Service: Urology;  Laterality: Left;    EXTRACORPOREAL SHOCK WAVE LITHOTRIPSY Left 04/11/2017   Procedure: LEFT EXTRACORPOREAL SHOCK WAVE LITHOTRIPSY (ESWL);  Surgeon: Bjorn Pippin, MD;  Location: [facility];  Service: Urology;  Laterality: Left;   EXTRACORPOREAL SHOCK WAVE LITHOTRIPSY Left 02/23/2021   Procedure: EXTRACORPOREAL SHOCK WAVE LITHOTRIPSY (ESWL);  Surgeon: Crista Elliot, MD;  Location[name];  Service: Urology;  Laterality: Left;   LITHOTRIPSY     02/18/17   neck bipsy     beningn, in his 73s     Current Outpatient Medications on File Prior to Visit  Medication Sig Dispense Refill   buPROPion (WELLBUTRIN SR) 150 MG 12 hr tablet 1 po BID 180 tablet 0   Choline Fenofibrate (FENOFIBRIC ACID) 45 MG CPDR Take 1 tablet by mouth daily. 30 capsule 0   metFORMIN (GLUCOPHAGE) 500 MG tablet Take 1 tablet (500 mg total) by mouth 2 (two) times daily with a meal. 60 tablet 0   Vitamin D, Ergocalciferol, (DRISDOL) 1.25 MG (50000 UNIT) CAPS capsule Take 1 capsule (50,000 Units total) by mouth every 7 (seven) days. 12 capsule 0   Fluorouracil (TOLAK) 4 % CREA Apply to affected area qhs Monday - Sunday x 2 weeks- expect irritation & avoid sunlight (Patient not taking: Reported on 11/26/2022) 40 g 1   No current facility-administered medications on file prior to visit.    No Known Allergies   DIAGNOSTIC DATA (LABS, IMAGING, TESTING) - I reviewed patient records, labs, notes, testing and imaging myself where available.  Lab Results  Component Value Date   WBC 5.3 10/18/2021   HGB 13.4 10/18/2021   HCT 39.7 10/18/2021   MCV 88.6 10/18/2021   PLT 160.0 10/18/2021      Component Value Date/Time   NA 142 04/09/2022 0818   K 4.6 04/09/2022 0818   CL 106 04/09/2022 0818   CO2 22 04/09/2022 0818   GLUCOSE 103 (H) 04/09/2022 0818   GLUCOSE 81 10/18/2021 1537   BUN 17 04/09/2022 0818   CREATININE 0.95 04/09/2022 0818   CALCIUM 8.7 04/09/2022 0818   PROT 6.6 04/09/2022 0818   ALBUMIN 4.5 04/09/2022 0818    AST 30 04/09/2022 0818   ALT 46 (H) 04/09/2022 0818   ALKPHOS 73 04/09/2022 0818   BILITOT 0.4 04/09/2022 0818   GFRNONAA 95 05/10/2020 0932   GFRAA 110 05/10/2020 0932   Lab Results  Component Value Date   CHOL 195 04/09/2022   HDL 27 (L) 04/09/2022   LDLCALC 130 (H) 04/09/2022   LDLDIRECT 116.0 06/24/2018   TRIG 213 (H) 04/09/2022   CHOLHDL 8 06/24/2018   Lab Results  Component Value Date   HGBA1C 5.5 04/09/2022   Lab  Results  Component Value Date   VITAMINB12 559 05/10/2020   Lab Results  Component Value Date   TSH 3.25 10/18/2021   BASELINE STUDY WITHOUT CPAP RESULTS:   Lights Out was at 22:37 and Lights On at 05:00.  Total recording time (TRT) was 111.5, with a total sleep time (TST) of 94 minutes. The patient's sleep latency was 12 minutes.  REM latency was 0 minutes.  The sleep efficiency was 84.3 %.    SLEEP ARCHITECTURE: WASO (Wake after sleep onset) was 3.5 minutes, Stage N1 was 6 minutes, Stage N2 was 49.5 minutes, Stage N3 was 38.5 minutes and Stage R (REM sleep) was 0 minutes.  The percentages were Stage N1 6.4%, Stage N2 52.7%, Stage N3 41.% and Stage R (REM sleep) 0%.  RESPIRATORY ANALYSIS:  There were a total of 105 respiratory events:  105 obstructive apneas, 0 central apneas and 0 mixed apneas with a total of 105 apneas and an apnea index (AI) of 67.. There were 0 hypopneas with a hypopnea index of 0. The patient also had 0 respiratory event related arousals (RERAs).  Snoring was noted.    The total APNEA/HYPOPNEA INDEX (AHI) was 67.0 /hour and the total RESPIRATORY DISTURBANCE INDEX was 67.0 /hour.  0 events occurred in REM sleep and 0 events in NREM. The REM AHI was 0.0 /hour versus a non-REM AHI of 67.0 /hour. The patient spent 241 minutes sleep time in the supine position, and 98 minutes in non-supine. The supine AHI was 120.0 /hour versus a non-supine AHI of 4.2 /hour.   OXYGEN SATURATION & C02:  The wake baseline 02 saturation was 92%, with the lowest  being 75%. Time spent below 89% saturation equaled 45 minutes.    PHYSICAL EXAM:  Today's Vitals   11/26/22 1014  BP: 124/61  Pulse: 78  Weight: 245 lb (111.1 kg)  Height: 5\' 10"  (1.778 m)   Body mass index is 35.15 kg/m.   Wt Readings from Last 3 Encounters:  11/26/22 245 lb (111.1 kg)  05/11/22 240 lb (108.9 kg)  04/09/22 234 lb (106.1 kg)     Ht Readings from Last 3 Encounters:  11/26/22 5\' 10"  (1.778 m)  05/11/22 5\' 10"  (1.778 m)  04/09/22 5\' 10"  (1.778 m)      General: The patient is awake, alert and appears not in acute distress.  Well developed, obese male in no acute distress  Head: normocephalic and atraumatic,. Oropharynx _ Mallampati 3 Neck: Supple, neck circumference 19 .25 inches.   Neurological examination    Mentation: Alert oriented to time, place, history taking. Attention span and concentration appropriate. Recent and remote memory intact.  Follows all commands speech and language fluent.   Cranial nerve :  Taste and smell are intact. He never lost either.     Pupils were equal round reactive to light extraocular movements were full,. Facial sensation and strength were normal. hearing was intact to finger rubbing bilaterally. Uvula tongue midline. head turning and shoulder shrug were normal and symmetric.Tongue protrusion into cheek strength was normal. Motor: normal bulk and tone,    Gait and station: Patient could rise unassisted from a seated position, walked without assistive device.  Stance is of normal width/ base and the patient turned with 3 steps.  Toe and heel walk were deferred.  Deep tendon reflexes: in the  upper and lower extremities are symmetric and intact.  Babinski response was deferred.    ASSESSMENT AND PLAN 52 y.o. year old male  here  with:    1) long-term CPAP user.  Highly compliant.  Since our last visit 16 months ago there has been an increase in weight.  His neck circumference is 1/4 inch larger.  I have no doubt that the  patient still needs to use CPAP but I would like to establish 1 night a home sleep test based new baseline.  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 This would mean one night on HST without CPAP.   His machine is now 59.53 years old and needs to be replaced.   I plan to follow up either personally or through our NP within 3-4 months.   I would like to thank Wanda Plump, Md [address] [place],  [place] [address] for allowing me to meet with and to take care of this pleasant patient.   After spending a total time of  35  minutes face to face and additional time for physical and neurologic examination, review of laboratory studies,  personal review of imaging studies, reports and results of other testing and review of referral information / records as far as provided in visit,   Electronically signed by: Melvyn Novas, MD 11/26/2022 10:54 AM  [facility] Board certified by The [employer] of Sleep Medicine and Diplomate of the [employer] of [employer] Medicine. Board certified In Neurology through the ABPN, Fellow of the [employer] of Neurology.

## 2022-11-26 NOTE — Patient Instructions (Signed)
Living With Sleep Apnea Sleep apnea is a condition in which breathing pauses or becomes shallow during sleep. Sleep apnea is most commonly caused by a collapsed or blocked airway. People with sleep apnea usually snore loudly. They may have times when they gasp and stop breathing for 10 seconds or more during sleep. This may happen many times during the night. The breaks in breathing also interrupt the deep sleep that you need to feel rested. Even if you do not completely wake up from the gaps in breathing, your sleep may not be restful and you feel tired during the day. You may also have a headache in the morning and low energy during the day, and you may feel anxious or depressed. How can sleep apnea affect me? Sleep apnea increases your chances of extreme tiredness during the day (daytime fatigue). It can also increase your risk for health conditions, such as: Heart attack. Stroke. Obesity. Type 2 diabetes. Heart failure. Irregular heartbeat. High blood pressure. If you have daytime fatigue as a result of sleep apnea, you may be more likely to: Perform poorly at school or work. Fall asleep while driving. Have difficulty with attention. Develop depression or anxiety. Have sexual dysfunction. What actions can I take to manage sleep apnea? Sleep apnea treatment  If you were given a device to open your airway while you sleep, use it only as told by your health care provider. You may be given: An oral appliance. This is a custom-made mouthpiece that shifts your lower jaw forward. A continuous positive airway pressure (CPAP) device. This device blows air through a mask when you breathe out (exhale). A nasal expiratory positive airway pressure (EPAP) device. This device has valves that you put into each nostril. A bi-level positive airway pressure (BIPAP) device. This device blows air through a mask when you breathe in (inhale) and breathe out (exhale). You may need surgery if other treatments  do not work for you. Sleep habits Go to sleep and wake up at the same time every day. This helps set your internal clock (circadian rhythm) for sleeping. If you stay up later than usual, such as on weekends, try to get up in the morning within 2 hours of your normal wake time. Try to get at least 7-9 hours of sleep each night. Stop using a computer, tablet, and mobile phone a few hours before bedtime. Do not take long naps during the day. If you nap, limit it to 30 minutes. Have a relaxing bedtime routine. Reading or listening to music may relax you and help you sleep. Use your bedroom only for sleep. Keep your television and computer out of your bedroom. Keep your bedroom cool, dark, and quiet. Use a supportive mattress and pillows. Follow your health care provider's instructions for other changes to sleep habits. Nutrition Do not eat heavy meals in the evening. Do not have caffeine in the later part of the day. The effects of caffeine can last for more than 5 hours. Follow your health care provider's or dietitian's instructions for any diet changes. Lifestyle     Do not drink alcohol before bedtime. Alcohol can cause you to fall asleep at first, but then it can cause you to wake up in the middle of the night and have trouble getting back to sleep. Do not use any products that contain nicotine or tobacco. These products include cigarettes, chewing tobacco, and vaping devices, such as e-cigarettes. If you need help quitting, ask your health care provider. Medicines Take   over-the-counter and prescription medicines only as told by your health care provider. Do not use over-the-counter sleep medicine. You can become dependent on this medicine, and it can make sleep apnea worse. Do not use medicines, such as sedatives and narcotics, unless told by your health care provider. Activity Exercise on most days, but avoid exercising in the evening. Exercising near bedtime can interfere with  sleeping. If possible, spend time outside every day. Natural light helps regulate your circadian rhythm. General information Lose weight if you need to, and maintain a healthy weight. Keep all follow-up visits. This is important. If you are having surgery, make sure to tell your health care provider that you have sleep apnea. You may need to bring your device with you. Where to find more information Learn more about sleep apnea and daytime fatigue from: American Sleep Association: sleepassociation.org National Sleep Foundation: sleepfoundation.org National Heart, Lung, and Blood Institute: nhlbi.nih.gov Summary Sleep apnea is a condition in which breathing pauses or becomes shallow during sleep. Sleep apnea can cause daytime fatigue and other serious health conditions. You may need to wear a device while sleeping to help keep your airway open. If you are having surgery, make sure to tell your health care provider that you have sleep apnea. You may need to bring your device with you. Making changes to sleep habits, diet, lifestyle, and activity can help you manage sleep apnea. This information is not intended to replace advice given to you by your health care provider. Make sure you discuss any questions you have with your health care provider. Document Revised: 02/08/2021 Document Reviewed: 06/10/2020 Elsevier Patient Education  2023 Elsevier Inc.  

## 2022-12-07 ENCOUNTER — Encounter: Payer: 59 | Admitting: Internal Medicine

## 2022-12-11 ENCOUNTER — Telehealth: Payer: Self-pay | Admitting: Neurology

## 2022-12-11 NOTE — Telephone Encounter (Signed)
MAILOUT- Alternate address; [employer] no auth req spok eto Maurilio Lovely ref # [id number]   Patient is on the schedule for 12/18/22.

## 2022-12-18 ENCOUNTER — Ambulatory Visit (INDEPENDENT_AMBULATORY_CARE_PROVIDER_SITE_OTHER): Payer: BC Managed Care – PPO | Admitting: Neurology

## 2022-12-18 DIAGNOSIS — G4733 Obstructive sleep apnea (adult) (pediatric): Secondary | ICD-10-CM

## 2022-12-18 DIAGNOSIS — E669 Obesity, unspecified: Secondary | ICD-10-CM

## 2023-01-02 ENCOUNTER — Telehealth: Payer: Self-pay

## 2023-01-02 NOTE — Procedures (Signed)
                            Oxygen Saturation Statistics:   O2 Saturation Range (%):   Between a nadir of 85% and a maximum of 100% and the mean saturation of 96%                                    O2 Saturation (minutes) <89%:    0.5 minutes       Pulse Rate Statistics:   Pulse Mean (bpm):   65 bpm              Pulse Range:      Between a minimum of 49 and a maximum of 101 bpm.           IMPRESSION:  This HST confirms sleep apnea is still present at a moderate degree.  There are no prolonged oxygen desaturations, but it is remarkable that there are newer non-REM sleep apneas then REM sleep apnea is which can indicate a central or complex apnea.    RECOMMENDATION: This patient needs to continue CPAP treatment by  ResMed auto titrating device, to be set between 6 and 15 cm water pressure with 3 cm EPR, with humidifier and mask of choice ( experienced CPAP user ) . Rv can be virtual with NP for this patient with long commute and limited daytime availability.     INTERPRETING PHYSICIAN:   Melvyn Novas, MD [facility].

## 2023-01-02 NOTE — Telephone Encounter (Signed)
-----   Message from Melvyn Novas, MD sent at 01/02/2023 12:53 PM EDT ----- Continued CPAP therapy is needed, new machine was ordered, patient can decide on interface. Please make a virtual follow up RV with NP

## 2023-01-02 NOTE — Progress Notes (Signed)
Continued CPAP therapy is needed, new machine was ordered, patient can decide on interface. Please make a virtual follow up RV with NP

## 2023-01-02 NOTE — Telephone Encounter (Addendum)
I called pt. I advised pt that Dr. Vickey Huger  reviewed their sleep study results and found that pt needs to continue PAP therapy. I reviewed PAP compliance expectations with the pt. Pt is agreeable to starting a CPAP. I advised pt that an order will be sent to current DME, [facility], and [facility]  will call the pt within about one week after they file with the pt's insurance. [facility] will show the pt how to use the machine, fit for masks, and troubleshoot the CPAP if needed. A VV follow up appt was made for insurance purposes with Margie Ege on 03/20/23. Pt verbalized understanding to arrive 15 minutes early and bring their CPAP. Pt verbalized understanding of results. Pt had no questions at this time but was encouraged to call back if questions arise. I have sent the order to [employer] and have received confirmation that they have received the order.

## 2023-01-03 NOTE — Telephone Encounter (Signed)
New, Maryruth Bun, Abbe Amsterdam, CMA; Kathyrn Sheriff; Durene Cal, Melissa Received, Thank you!       Previous Messages    ----- Message ----- From: Bobbye Morton, CMA Sent: 01/02/2023   2:31 PM EDT To: Penni Homans; Santina Evans; * Subject: New Autopap                                    New orders have been placed for the above pt, DOB: 10/05/1970 Thanks

## 2023-01-04 ENCOUNTER — Ambulatory Visit (INDEPENDENT_AMBULATORY_CARE_PROVIDER_SITE_OTHER): Payer: BC Managed Care – PPO | Admitting: Medical

## 2023-01-04 VITALS — BP 132/78 | HR 77 | Temp 98.1°F | Resp 18 | Ht 70.0 in | Wt 248.6 lb

## 2023-01-04 DIAGNOSIS — R0982 Postnasal drip: Secondary | ICD-10-CM | POA: Diagnosis not present

## 2023-01-04 DIAGNOSIS — R059 Cough, unspecified: Secondary | ICD-10-CM | POA: Diagnosis not present

## 2023-01-04 DIAGNOSIS — B029 Zoster without complications: Secondary | ICD-10-CM

## 2023-01-04 DIAGNOSIS — R0981 Nasal congestion: Secondary | ICD-10-CM | POA: Diagnosis not present

## 2023-01-04 MED ORDER — AZITHROMYCIN 250 MG PO TABS: ORAL_TABLET | ORAL | 0 refills | Status: AC

## 2023-01-04 MED ORDER — FAMCICLOVIR 500 MG PO TABS: 500.0000 mg | ORAL_TABLET | Freq: Three times a day (TID) | ORAL | 0 refills | Status: DC

## 2023-01-04 MED ORDER — TRAMADOL HCL 50 MG PO TABS: ORAL_TABLET | ORAL | 0 refills | Status: DC

## 2023-01-04 MED ORDER — FLUTICASONE PROPIONATE 50 MCG/ACT NA SUSP: 2.0000 | Freq: Every day | NASAL | 1 refills | Status: DC

## 2023-01-04 MED ORDER — BENZONATATE 100 MG PO CAPS: 100.0000 mg | ORAL_CAPSULE | Freq: Three times a day (TID) | ORAL | 0 refills | Status: DC | PRN

## 2023-01-04 NOTE — Patient Instructions (Addendum)
Herpes zoster without complication -rx famivir. Late start as best to begin within 24 hours rash onset. But still think will be benifical. - rx tramadol to use for shingles nerve pain.  2 weeks Nasal congestion,  PND (post-nasal drip), and Cough(lingering signs/symptoms) -flonase nasal spray, benzonatate for cough and azithromycin.  Follow up as regularly scheduled with pcp or sooner if needed.

## 2023-01-04 NOTE — Progress Notes (Signed)
Subjective:    Patient ID: Cameron Valenzuela, male    DOB: 1971/04/09, 52 y.o.   MRN: [id number]  HPI Pt in for left side vesicular rash for about one weeks the rash is stinging and itching. No prior history of shingles.  No prior shingrix vaccines. Senstivie to light touch. Pt states can sleep.  Pt states moderate high stress with work.   Some nasal congestion,  pnd recently and dry cough for past 2 weeks. He states he felt like had a cold about 2 weeks. Occaional blowing out some colored mucus when blows nose.     Review of Systems  Constitutional:  Negative for chills and fever.  HENT:  Positive for congestion and postnasal drip.   Respiratory:  Positive for cough. Negative for choking and wheezing.   Cardiovascular:  Negative for chest pain and palpitations.  Gastrointestinal:  Negative for abdominal pain, blood in stool and constipation.  Musculoskeletal:  Negative for back pain and myalgias.  Skin:  Positive for rash.  Hematological:  Negative for adenopathy. Does not bruise/bleed easily.  Psychiatric/Behavioral:  Negative for behavioral problems and confusion.     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   Sleep apnea    uses CPAP     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  Tobacco Use   Smoking status:  Never   Smokeless tobacco: Never  Vaping Use   Vaping Use: Never used  Substance and Sexual Activity   Alcohol use: Not Currently    Comment: rarely   Drug use: No   Sexual activity: Yes  Other Topics Concern   Not on file  Social History Narrative   Household-- pt, wife, child     (mother in law Jearld Adjutant, passed away Feb 09, 2016)   Social Determinants of Health   Financial Resource Strain: Not on file  Food Insecurity: Not on file  Transportation Needs: Not on file  Physical Activity: Not on file  Stress: Not on file  Social Connections: Not on file  Intimate Partner Violence: Not on file    Past Surgical History:  Procedure Laterality Date   CARPAL TUNNEL RELEASE Right 10/12/2021   Cystocopy     Dr. Vernie Ammons   CYSTOSCOPY WITH STENT PLACEMENT Left 02/18/2017   Procedure: CYSTOSCOPY/ LEFT RETROGRADE/ WITH LEFT STENT PLACEMENT;  Surgeon: Ihor Gully, MD;  Location: [facility];  Service: Urology;  Laterality: Left;   EXTRACORPOREAL SHOCK WAVE LITHOTRIPSY Left 02/18/2017   Procedure: LEFT EXTRACORPOREAL SHOCK WAVE LITHOTRIPSY (ESWL);  Surgeon: Marcine Matar, MD;  Location: [facility];  Service: Urology;  Laterality: Left;   EXTRACORPOREAL SHOCK  WAVE LITHOTRIPSY Left 04/11/2017   Procedure: LEFT EXTRACORPOREAL SHOCK WAVE LITHOTRIPSY (ESWL);  Surgeon: Bjorn Pippin, MD;  Location: [facility];  Service: Urology;  Laterality: Left;   EXTRACORPOREAL SHOCK WAVE LITHOTRIPSY Left 02/23/2021   Procedure: EXTRACORPOREAL SHOCK WAVE LITHOTRIPSY (ESWL);  Surgeon: Crista Elliot, MD;  Location: [facility];  Service: Urology;  Laterality: Left;   LITHOTRIPSY     02/18/17   neck bipsy     beningn, in his 70s    Family History  Problem Relation Age of Onset   Hyperlipidemia Mother        M and others   Cancer Mother    Breast cancer Mother        dx early 59s   Diabetes Father    COPD Father    Heart disease Father        ?   Stroke Father        18   Sleep apnea Father     Obesity Father    Ovarian cancer Maternal Grandmother 50   Heart disease Maternal Grandfather    Heart disease Paternal Grandmother    Diabetes Other        many fam members    Colon cancer Neg Hx    Prostate cancer Neg Hx    Colon polyps Neg Hx    Esophageal cancer Neg Hx    Rectal cancer Neg Hx    Stomach cancer Neg Hx     No Known Allergies  Current Outpatient Medications on File Prior to Visit  Medication Sig Dispense Refill   buPROPion (WELLBUTRIN SR) 150 MG 12 hr tablet 1 po BID 180 tablet 0   Choline Fenofibrate (FENOFIBRIC ACID) 45 MG CPDR Take 1 tablet by mouth daily. 30 capsule 0   metFORMIN (GLUCOPHAGE) 500 MG tablet Take 1 tablet (500 mg total) by mouth 2 (two) times daily with a meal. 60 tablet 0   Vitamin D, Ergocalciferol, (DRISDOL) 1.25 MG (50000 UNIT) CAPS capsule Take 1 capsule (50,000 Units total) by mouth every 7 (seven) days. 12 capsule 0   No current facility-administered medications on file prior to visit.    BP 132/78   Pulse 77   Temp 98.1 F (36.7 C)   Resp 18   Ht 5\' 10"  (1.778 m)   Wt 248 lb 9.6 oz (112.8 kg)   SpO2 97%   BMI 35.67 kg/m        Objective:   Physical Exam  General Mental Status- Alert. General Appearance- Not in acute distress.   Skin General: Color- Normal Color. Moisture- Normal Moisture.  Neck Carotid Arteries- Normal color. Moisture- Normal Moisture. No carotid bruits. No JVD.  Chest and Lung Exam Auscultation: Breath Sounds:-Normal.  Cardiovascular Auscultation:Rythm- Regular. Murmurs & Other Heart Sounds:Auscultation of the heart reveals- No Murmurs.  Abdomen Inspection:-Inspeection Normal. Palpation/Percussion:Note:No mass. Palpation and Percussion of the abdomen reveal- Non Tender, Non Distended + BS, no rebound or guarding.   Neurologic Cranial Nerve exam:- CN III-XII intact(No nystagmus), symmetric smile. Strength:- 5/5 equal and symmetric strength both upper and lower extremities.   Skin-  dermatomal distribution vesicular rash  in classic pattern wrapping from back toward chess  Heent - nasal congestion. No sinus pressure.    Assessment & Plan:   Patient Instructions  Herpes zoster without complication -rx famivir. Late start as best to begin within 24 hours rash onset. But still think will be benifical. - rx tramadol to use for shingles nerve pain.  2 weeks Nasal congestion,  PND (post-nasal drip), and Cough(lingering signs/symptoms) -flonase nasal spray, benzonatate for cough and azithromycin.  Follow up as regularly scheduled with pcp or sooner if needed.      Esperanza Richters, PA-C

## 2023-01-08 ENCOUNTER — Ambulatory Visit (INDEPENDENT_AMBULATORY_CARE_PROVIDER_SITE_OTHER): Payer: BC Managed Care – PPO | Admitting: Internal Medicine

## 2023-01-08 ENCOUNTER — Encounter: Payer: Self-pay | Admitting: Internal Medicine

## 2023-01-08 VITALS — BP 118/80 | HR 85 | Temp 98.1°F | Resp 18 | Ht 70.0 in | Wt 248.0 lb

## 2023-01-08 DIAGNOSIS — E785 Hyperlipidemia, unspecified: Secondary | ICD-10-CM

## 2023-01-08 DIAGNOSIS — Z0000 Encounter for general adult medical examination without abnormal findings: Secondary | ICD-10-CM

## 2023-01-08 DIAGNOSIS — R739 Hyperglycemia, unspecified: Secondary | ICD-10-CM

## 2023-01-08 DIAGNOSIS — R7989 Other specified abnormal findings of blood chemistry: Secondary | ICD-10-CM

## 2023-01-08 DIAGNOSIS — L719 Rosacea, unspecified: Secondary | ICD-10-CM

## 2023-01-08 DIAGNOSIS — Z85828 Personal history of other malignant neoplasm of skin: Secondary | ICD-10-CM

## 2023-01-08 DIAGNOSIS — E559 Vitamin D deficiency, unspecified: Secondary | ICD-10-CM | POA: Diagnosis not present

## 2023-01-08 NOTE — Patient Instructions (Addendum)
Vaccines I recommend;  Covid booster Shingrix (shingles) Flu shot this fall  Wellbutrin: Take only 1 tablet daily for 1 month then you can stop.  For nasal congestion: Retry Flonase You can also try Astepro over-the-counter 2 sprays on each side of the nose twice daily  Check the  blood pressure regularly BP GOAL is between 110/65 and  135/85. If it is consistently higher or lower, let me know     GO TO THE FRONT DESK, PLEASE SCHEDULE YOUR APPOINTMENTS Schedule labs to be done at your earliest convenience. Come back for a checkup in 4 months

## 2023-01-08 NOTE — Progress Notes (Unsigned)
Subjective:    Patient ID: Cameron Valenzuela, male    DOB: [birth year], 52 y.o.   MRN: [id number]  DOS:  01/08/2023 Type of visit - description: CPX  Here for CPX Other issues were addressed Had shingles, minimal pain.  Had a sinus infection recently, finishing a Z-Pak, still having cough, mostly at night when she lays down.    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 Vernie Ammons   CYSTOSCOPY WITH STENT PLACEMENT Left 02/18/2017   Procedure: CYSTOSCOPY/ LEFT RETROGRADE/ WITH LEFT STENT PLACEMENT;  Surgeon: Ihor Gully, MD;  Location: [facility];  Service: Urology;  Laterality: Left;   EXTRACORPOREAL SHOCK WAVE LITHOTRIPSY Left 02/18/2017   Procedure: LEFT EXTRACORPOREAL SHOCK WAVE LITHOTRIPSY (ESWL);  Surgeon: Marcine Matar, MD;  Location: [facility];  Service: Urology;  Laterality: Left;   EXTRACORPOREAL SHOCK WAVE LITHOTRIPSY Left 04/11/2017   Procedure: LEFT EXTRACORPOREAL SHOCK WAVE LITHOTRIPSY (ESWL);  Surgeon: Bjorn Pippin, MD;  Location: [facility];  Service: Urology;  Laterality: Left;   EXTRACORPOREAL SHOCK WAVE LITHOTRIPSY Left 02/23/2021   Procedure: EXTRACORPOREAL SHOCK WAVE LITHOTRIPSY (ESWL);  Surgeon: Crista Elliot, MD;  Location: [facility];  Service: Urology;  Laterality: Left;   LITHOTRIPSY     02/18/17   neck bipsy     beningn, in his  18s    Current Outpatient Medications  Medication Instructions   azithromycin (ZITHROMAX) 250 MG tablet Take 2 tablets on day 1, then 1 tablet daily on days 2 through 5   benzonatate (TESSALON) 100 mg, Oral, 3 times daily PRN   buPROPion (WELLBUTRIN SR) 150 MG 12 hr tablet 1 po BID   Choline Fenofibrate (FENOFIBRIC ACID) 45 MG CPDR 1 tablet, Oral, Daily   famciclovir (FAMVIR) 500 mg, Oral, 3 times daily   fluticasone (FLONASE) 50 MCG/ACT nasal spray 2 sprays, Each Nare, Daily   metFORMIN (GLUCOPHAGE) 500 mg, Oral, 2 times daily with meals   traMADol (ULTRAM) 50 MG tablet 1 tab po every 8 hours prn severe nerve pain   Vitamin D (Ergocalciferol) (DRISDOL) 50,000 Units, Oral, Every 7 days       Objective:   Physical Exam BP 118/80   Pulse 85   Temp 98.1 F (36.7 C) (Oral)   Resp 18   Ht 5\' 10"  (1.778 m)   Wt 248 lb (112.5 kg)   SpO2 98%   BMI 35.58 kg/m  General: Well developed, NAD, BMI noted Neck: No  thyromegaly Nose: Slightly congested HEENT:  Normocephalic . Face symmetric, atraumatic Lungs:  CTA B Normal respiratory effort, no intercostal retractions, no accessory muscle use. Heart: RRR,  no murmur.  Abdomen:  Not distended, soft, non-tender. No rebound or rigidity.   Lower extremities: no pretibial edema bilaterally  Skin: Dried up ulcers at the left side of the chest. Neurologic:  alert & oriented X3.  Speech normal, gait appropriate for age and unassisted Strength symmetric and appropriate for age.  Psych: Cognition and judgment appear intact.  Cooperative with normal attention span and concentration.  Behavior appropriate. No anxious or depressed appearing.     Assessment     Assessment Pre diabetes Dyslipidemia Seasonal allergies Kidney stones ([facility]) OSA per sleep 2018 , has a CPAP  Rosacea, SCC dx 2019 Obesity  Vit D  def   PLAN: Here for CPX   - Td 2016 -Since I recommend: shingrix , COVID booster and flu shot -CCS:    no  FH, cscope 10-2020, next per GI  -Prostate cancer screening: No FH, no symptoms, check PSA -Diet-exercise: Extensively discussed - CMP FLP CBC A1c TSH free T4 PSA vitamin D   Prediabetes: The last 6 months has been living out of town in a hotel, diet has not been healthy, + weight gain.  We had extensive discussion about diet. Dyslipidemia: On fenofibrate, checking labs Seizure, history of skin cancer: Refer to a new dermatologist. Obesity: Used to see the wellness center, on metformin for mild hyperglycemia which we will keep.  On Wellbutrin to help with cravings, it is not working well for him, we agreed to wean off. Sinus infection: Seen by one of my partners recently, still has symptoms, Tessalon Perles helps, continue with them.  Flonase caused nosebleeds, he could retry however.  Also recommend Astepro. Vitamin D deficiency: Recommend 2000 units daily, checking labs. RTC 4 months.  ==== Prediabetes: On metformin, follow-up in the wellness clinic, check A1c Dyslipidemia: On fenofibrate, not fasting today, we agreed she will follow-up with the wellness clinic Rosacea: Sees dermatology regularly OSA: Reports good CPAP compliance Obesity: Working hard on his lifestyle, he lost a significant amount of weight at some point, then regained it and now is in the process of losing it again. Increased LFTs: Noted 11-22, no significant EtOH, no Tylenol, possibly fatty liver, rechecking today.  Currently taking ibuprofen regularly due to recent CTS surgery RTC 1 year    This visit occurred during the SARS-CoV-2 public health emergency.  Safety protocols were in place, includ

## 2023-01-09 ENCOUNTER — Encounter: Payer: Self-pay | Admitting: Internal Medicine

## 2023-01-09 NOTE — Assessment & Plan Note (Signed)
Here for CPX Prediabetes: The last 6 months has been living out of town in a hotel, diet has not been healthy, + weight gain.  We had extensive discussion about diet. Dyslipidemia: On fenofibrate, checking labs Rosacea, h/o  skin cancer: Refer to a new dermatologist. Obesity: Used to see the wellness center, on metformin for mild hyperglycemia which we will keep.  On Wellbutrin to help with cravings, it is not working well for him, we agreed to wean off. Sinus infection: Seen by one of my partners recently, still has symptoms, Tessalon Perles helps, continue with them.  Flonase caused nosebleeds, he could retry however.  Also recommend Astepro. Vitamin D deficiency: Recommend 2000 units daily, checking labs. RTC 4 months.

## 2023-01-09 NOTE — Assessment & Plan Note (Signed)
-   Td 2016 - Vax I recommend: shingrix , COVID booster and flu shot -CCS:    no FH, cscope 10-2020, next per GI  -Prostate cancer screening: No FH, no symptoms, check PSA -Diet-exercise: Extensively discussed - CMP FLP CBC A1c TSH free T4 PSA vitamin D

## 2023-01-11 ENCOUNTER — Other Ambulatory Visit (INDEPENDENT_AMBULATORY_CARE_PROVIDER_SITE_OTHER): Payer: BC Managed Care – PPO

## 2023-01-11 DIAGNOSIS — Z0000 Encounter for general adult medical examination without abnormal findings: Secondary | ICD-10-CM

## 2023-01-11 DIAGNOSIS — Z125 Encounter for screening for malignant neoplasm of prostate: Secondary | ICD-10-CM | POA: Diagnosis not present

## 2023-01-11 DIAGNOSIS — R739 Hyperglycemia, unspecified: Secondary | ICD-10-CM

## 2023-01-11 DIAGNOSIS — R7989 Other specified abnormal findings of blood chemistry: Secondary | ICD-10-CM

## 2023-01-11 DIAGNOSIS — E559 Vitamin D deficiency, unspecified: Secondary | ICD-10-CM | POA: Diagnosis not present

## 2023-01-11 DIAGNOSIS — E785 Hyperlipidemia, unspecified: Secondary | ICD-10-CM

## 2023-01-11 LAB — COMPREHENSIVE METABOLIC PANEL
ALT: 86 U/L — ABNORMAL HIGH (ref 0–53)
AST: 49 U/L — ABNORMAL HIGH (ref 0–37)
Albumin: 4.1 g/dL (ref 3.5–5.2)
Alkaline Phosphatase: 90 U/L (ref 39–117)
BUN: 15 mg/dL (ref 6–23)
CO2: 27 mEq/L (ref 19–32)
Calcium: 9.5 mg/dL (ref 8.4–10.5)
Chloride: 105 mEq/L (ref 96–112)
Creatinine, Ser: 0.9 mg/dL (ref 0.40–1.50)
GFR: 97.71 mL/min (ref >60.00)
Glucose, Bld: 100 mg/dL — ABNORMAL HIGH (ref 70–99)
Potassium: 4.4 mEq/L (ref 3.5–5.1)
Sodium: 139 mEq/L (ref 135–145)
Total Bilirubin: 0.5 mg/dL (ref 0.2–1.2)
Total Protein: 6.5 g/dL (ref 6.0–8.3)

## 2023-01-11 LAB — CBC WITH DIFFERENTIAL/PLATELET
Basophils Absolute: 0 10*3/uL (ref 0.0–0.1)
Basophils Relative: 0.6 % (ref 0.0–3.0)
Eosinophils Absolute: 0.1 10*3/uL (ref 0.0–0.7)
Eosinophils Relative: 1.5 % (ref 0.0–5.0)
HCT: 40.5 % (ref 39.0–52.0)
Hemoglobin: 13.4 g/dL (ref 13.0–17.0)
Lymphocytes Relative: 45.4 % (ref 12.0–46.0)
Lymphs Abs: 2.7 10*3/uL (ref 0.7–4.0)
MCHC: 33.2 g/dL (ref 30.0–36.0)
MCV: 92.5 fl (ref 78.0–100.0)
Monocytes Absolute: 0.4 10*3/uL (ref 0.1–1.0)
Monocytes Relative: 6.3 % (ref 3.0–12.0)
Neutro Abs: 2.8 10*3/uL (ref 1.4–7.7)
Neutrophils Relative %: 46.2 % (ref 43.0–77.0)
Platelets: 201 10*3/uL (ref 150.0–400.0)
RBC: 4.37 Mil/uL (ref 4.22–5.81)
RDW: 15 % (ref 11.5–15.5)
WBC: 6 10*3/uL (ref 4.0–10.5)

## 2023-01-11 LAB — LIPID PANEL
Cholesterol: 183 mg/dL (ref 0–200)
HDL: 27 mg/dL — ABNORMAL LOW (ref >39.00)
NonHDL: 155.74
Total CHOL/HDL Ratio: 7
Triglycerides: 310 mg/dL — ABNORMAL HIGH (ref 0.0–149.0)
VLDL: 62 mg/dL — ABNORMAL HIGH (ref 0.0–40.0)

## 2023-01-11 LAB — PSA: PSA: 0.35 ng/mL (ref 0.10–4.00)

## 2023-01-11 LAB — HEMOGLOBIN A1C: Hgb A1c MFr Bld: 5.5 % (ref 4.6–6.5)

## 2023-01-11 LAB — VITAMIN D 25 HYDROXY (VIT D DEFICIENCY, FRACTURES): VITD: 41.22 ng/mL (ref 30.00–100.00)

## 2023-01-11 LAB — T4, FREE: Free T4: 0.79 ng/dL (ref 0.60–1.60)

## 2023-01-11 LAB — TSH: TSH: 3.01 u[IU]/mL (ref 0.35–5.50)

## 2023-01-11 LAB — LDL CHOLESTEROL, DIRECT: Direct LDL: 106 mg/dL

## 2023-01-14 NOTE — Addendum Note (Signed)
Addended byConrad [place] D on: 01/14/2023 11:57 AM   Modules accepted: Orders

## 2023-01-27 ENCOUNTER — Other Ambulatory Visit: Payer: Self-pay | Admitting: Medical

## 2023-01-30 ENCOUNTER — Encounter: Payer: Self-pay | Admitting: Internal Medicine

## 2023-02-14 ENCOUNTER — Other Ambulatory Visit (INDEPENDENT_AMBULATORY_CARE_PROVIDER_SITE_OTHER): Payer: Self-pay | Admitting: Family Medicine

## 2023-02-14 DIAGNOSIS — F3289 Other specified depressive episodes: Secondary | ICD-10-CM

## 2023-02-28 ENCOUNTER — Telehealth: Payer: Self-pay | Admitting: Internal Medicine

## 2023-02-28 NOTE — Telephone Encounter (Signed)
The patient called and mentioned that he was previously receiving Metformin from his weight-loss doctor but has recently stopped seeing that provider. He wants to know if Dr. Drue Novel can prescribe a 30-day refill of the medication. Please advise the patient.  Pt is using   [id number] [address], [place] - [address] [place] [address] Phone: [phone]  Fax: [phone]

## 2023-02-28 NOTE — Telephone Encounter (Signed)
Okay to prescribe med?

## 2023-03-05 ENCOUNTER — Other Ambulatory Visit (INDEPENDENT_AMBULATORY_CARE_PROVIDER_SITE_OTHER): Payer: Self-pay | Admitting: Family Medicine

## 2023-03-05 DIAGNOSIS — E88819 Insulin resistance, unspecified: Secondary | ICD-10-CM

## 2023-03-20 ENCOUNTER — Telehealth: Payer: Self-pay

## 2023-03-20 ENCOUNTER — Telehealth: Payer: BC Managed Care – PPO | Admitting: Neurology

## 2023-03-20 ENCOUNTER — Other Ambulatory Visit (INDEPENDENT_AMBULATORY_CARE_PROVIDER_SITE_OTHER): Payer: Self-pay | Admitting: Family Medicine

## 2023-03-20 DIAGNOSIS — G4733 Obstructive sleep apnea (adult) (pediatric): Secondary | ICD-10-CM

## 2023-03-20 DIAGNOSIS — E88819 Insulin resistance, unspecified: Secondary | ICD-10-CM

## 2023-03-20 NOTE — Progress Notes (Signed)
   Virtual Visit via Video Note  I connected with Cameron Valenzuela on 03/20/23 at  1:00 PM EDT by a video enabled telemedicine application and verified that I am speaking with the correct person using two identifiers.  Location: Patient: in his car Provider: in the office    I discussed the limitations of evaluation and management by telemedicine and the availability of in person appointments. The patient expressed understanding and agreed to proceed.  History of Present Illness: Today, March 20, 2023 SS: Saw Dr. Vickey Huger in May 2024 as a long-term compliance CPAP patient.  He was seeking a new HST and CPAP machine. HST 12/31/22 showed moderate OSA, Calculated pAHI (per hour): 20.7/h, REM pAHI:17/h, NREM pAHI: 22.3/h.  Start date new CPAP 01/14/23. 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 Sometimes forgot to place CPAP on. Uses FFM, feels leaking but is baseline, no bothersome, has some facial hair. Feels needs more pressure.  Good benefit with CPAP, tolerating well.  Has superb compliance below:  CPAP data 02/10/23-03/11/23 30/30 days at 100%, > 4 hours 97%. 6-16 cm water. AHI 6.0, Leak 25.   11/26/22 Dr. Vickey Huger: Arcadio Crute Vessel is a 52 y.o. male patient who is here for revisit 11/26/2022 for  CPAP follow up.  He remains a compliant user of CPAP, needs a HST to get a baseline established.  He has a new position, he commutes to [place]. 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 He is pre diabetic.   Compliance data: Mr. Koel is 19-day download shows 80% compliance and this has certainly been affected by his long commute and different lifestyle right now he is on a minimum pressure of 8 maximum pressure setting of 28 cm water was 2 cm EPR and his residual  AHI is 5.4.  There is a half brother high air leak which may be related to a need to change mask and headgear.  He also has facial hair.  The 95th percentile pressure is 16.2 cm water.  No central apneas are emerging.  The need for a new CPAP is not in doubt but I like to have a baseline.   Observations/Objective: Via video visit, is in his car, speech is clear and concise, follows commands  Assessment and Plan: 1.  OSA on CPAP  -Commended on superb compliance -Increase pressure range 6-18 cm water (currently 6-16), AHI 6.0 -Pull a CPAP download in 1 month to recheck, reach out sooner if needed -Continue nightly usage for minimum of 4 hours  Follow Up Instructions: 1 year virtually   I discussed the assessment and treatment plan with the patient. The patient was provided an opportunity to ask questions and all were answered. The patient agreed with the plan and demonstrated an understanding of the instructions.   The patient was advised to call back or seek an in-person evaluation if the symptoms worsen or if the condition fails to improve as anticipated.  Otila Kluver, DNP  [facility] [address] [place], [place] [address] [phone]

## 2023-03-20 NOTE — Progress Notes (Signed)
Community message sent

## 2023-03-20 NOTE — Telephone Encounter (Signed)
Community message sent via epic for CPAP orders

## 2023-03-20 NOTE — Patient Instructions (Signed)
Great to meet you today.  Please continue superb CPAP compliance.  Recommend nightly usage for minimum of 4 hours.  I will increase your pressure range 6-18 cm water.  I will pull a download in 1 month, reach out to you via MyChart.  Otherwise we will see you back in 1 year.  Thanks!!

## 2023-04-22 ENCOUNTER — Encounter: Payer: Self-pay | Admitting: Neurology

## 2023-05-10 ENCOUNTER — Encounter: Payer: Self-pay | Admitting: Internal Medicine

## 2023-05-10 ENCOUNTER — Ambulatory Visit: Payer: BC Managed Care – PPO | Admitting: Internal Medicine

## 2023-05-10 VITALS — BP 130/72 | HR 85 | Temp 98.0°F | Resp 18 | Ht 70.0 in | Wt 261.1 lb

## 2023-05-10 DIAGNOSIS — Z23 Encounter for immunization: Secondary | ICD-10-CM

## 2023-05-10 DIAGNOSIS — E782 Mixed hyperlipidemia: Secondary | ICD-10-CM

## 2023-05-10 DIAGNOSIS — E785 Hyperlipidemia, unspecified: Secondary | ICD-10-CM

## 2023-05-10 DIAGNOSIS — R748 Abnormal levels of other serum enzymes: Secondary | ICD-10-CM | POA: Diagnosis not present

## 2023-05-10 DIAGNOSIS — E88819 Insulin resistance, unspecified: Secondary | ICD-10-CM

## 2023-05-10 DIAGNOSIS — Z6837 Body mass index (BMI) 37.0-37.9, adult: Secondary | ICD-10-CM

## 2023-05-10 DIAGNOSIS — E669 Obesity, unspecified: Secondary | ICD-10-CM | POA: Diagnosis not present

## 2023-05-10 DIAGNOSIS — R739 Hyperglycemia, unspecified: Secondary | ICD-10-CM

## 2023-05-10 LAB — BASIC METABOLIC PANEL
BUN: 16 mg/dL (ref 6–23)
CO2: 28 meq/L (ref 19–32)
Calcium: 9.3 mg/dL (ref 8.4–10.5)
Chloride: 98 meq/L (ref 96–112)
Creatinine, Ser: 1 mg/dL (ref 0.40–1.50)
GFR: 85.91 mL/min (ref >60.00)
Glucose, Bld: 390 mg/dL — ABNORMAL HIGH (ref 70–99)
Potassium: 4.3 meq/L (ref 3.5–5.1)
Sodium: 135 meq/L (ref 135–145)

## 2023-05-10 LAB — AST: AST: 61 U/L — ABNORMAL HIGH (ref 0–37)

## 2023-05-10 LAB — ALT: ALT: 80 U/L — ABNORMAL HIGH (ref 0–53)

## 2023-05-10 MED ORDER — METFORMIN HCL 500 MG PO TABS: 500.0000 mg | ORAL_TABLET | Freq: Two times a day (BID) | ORAL | 1 refills | Status: DC

## 2023-05-10 MED ORDER — FENOFIBRIC ACID 45 MG PO CPDR: 1.0000 | DELAYED_RELEASE_CAPSULE | Freq: Every day | ORAL | 1 refills | Status: DC

## 2023-05-10 NOTE — Progress Notes (Signed)
Subjective:    Patient ID: Cameron Valenzuela, male    DOB: 1971-01-03, 52 y.o.   MRN: [id number]  DOS:  05/10/2023 Type of visit - description: Follow-up  Follow-up from previous visit, chronic medical problems addressed.   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 Vernie Ammons   CYSTOSCOPY WITH STENT PLACEMENT Left 02/18/2017   Procedure: CYSTOSCOPY/ LEFT RETROGRADE/ WITH LEFT STENT PLACEMENT;  Surgeon: Ihor Gully, MD;  Location: [facility];  Service: Urology;  Laterality: Left;   EXTRACORPOREAL SHOCK WAVE LITHOTRIPSY Left 02/18/2017   Procedure: LEFT EXTRACORPOREAL SHOCK WAVE LITHOTRIPSY (ESWL);  Surgeon: Marcine Matar, MD;  Location: [facility];  Service: Urology;  Laterality: Left;   EXTRACORPOREAL SHOCK WAVE LITHOTRIPSY Left 04/11/2017   Procedure: LEFT EXTRACORPOREAL SHOCK WAVE LITHOTRIPSY (ESWL);  Surgeon: Bjorn Pippin, MD;  Location: [facility];  Service: Urology;  Laterality: Left;   EXTRACORPOREAL SHOCK WAVE LITHOTRIPSY Left 02/23/2021   Procedure: EXTRACORPOREAL SHOCK WAVE LITHOTRIPSY (ESWL);  Surgeon: Crista Elliot, MD;  Location: [facility];  Service: Urology;  Laterality: Left;   LITHOTRIPSY     02/18/17    neck bipsy     beningn, in his 57s    Current Outpatient Medications  Medication Instructions   benzonatate (TESSALON) 100 mg, Oral, 3 times daily PRN   buPROPion (WELLBUTRIN SR) 150 MG 12 hr tablet 1 po BID   cholecalciferol (VITAMIN D3) 2,000 Units, Daily   Choline Fenofibrate (FENOFIBRIC ACID) 45 MG CPDR 1 tablet, Oral, Daily   famciclovir (FAMVIR) 500 mg, Oral, 3 times daily   fluticasone (FLONASE) 50 MCG/ACT nasal spray SPRAY 2 SPRAYS INTO EACH NOSTRIL EVERY DAY   metFORMIN (GLUCOPHAGE) 500 mg, Oral, 2 times daily with meals       Objective:   Physical Exam BP 130/72   Pulse 85   Temp 98 F (36.7 C) (Oral)   Resp 18   Ht 5\' 10"  (1.778 m)   Wt 261 lb 2 oz (118.4 kg)   SpO2 96%   BMI 37.47 kg/m  General:   Well developed, NAD, BMI noted. HEENT:  Normocephalic . Face symmetric, atraumatic Lower extremities: no pretibial edema bilaterally  Skin: Not pale. Not jaundice Neurologic:  alert & oriented X3.  Speech normal, gait appropriate for age and unassisted Psych--  Cognition and judgment appear intact.  Cooperative with normal attention span and concentration.  Behavior appropriate. No anxious or depressed appearing.  Assessment    Assessment Pre diabetes Dyslipidemia Seasonal allergies Kidney stones ([facility]) OSA per sleep 2018 , has a CPAP  Rosacea, SCC dx 2019 Obesity  Vit D  def  Increase LFTs: Fatty liver per [place] 2017. Hep C neg 2022.  No EtOH   PLAN: Morbid obesity: Weight gain continue, unfortunately since LOV, has not been living at home, [place].  Very difficult access to healthy food. I stressed the importance of a healthier diet, he plans to do better, he does have access to a treadmill, encouraged to use it. Prediabetes: Ran out of metformin, RF send Dyslipidemia: Ran out of fenofibrate,, RF sent.  Check BMP Increased LFTs: Check AST ALT and hepatitis serology. Preventive care: Flu shot  today, plans to proceed with a COVID-vaccine. RTC 4 months.

## 2023-05-10 NOTE — Assessment & Plan Note (Signed)
Morbid obesity: Weight gain continue, unfortunately since LOV, has not been living at home, living [place] the state due to [place].  Very difficult access to healthy food. I stressed the importance of a healthier diet, he plans to do better, he does have access to a treadmill, encouraged to use it. Prediabetes: Ran out of metformin, RF send Dyslipidemia: Ran out of fenofibrate,, RF sent.  Check BMP Increased LFTs: Check AST ALT and hepatitis serology. Preventive care: Flu shot today, plans to proceed with a COVID-vaccine. RTC 4 months.

## 2023-05-10 NOTE — Patient Instructions (Addendum)
We are refilling metformin and fenofibrate.  Vaccines I recommend: Shingrix (shingles) Covid booster     GO TO THE LAB : Get the blood work     Next visit with me in 4 months for a checkup    Please schedule it at the front desk

## 2023-05-11 LAB — HEPATITIS B SURFACE ANTIBODY,QUALITATIVE: Hep B S Ab: NONREACTIVE

## 2023-05-11 LAB — HEPATITIS B SURFACE ANTIGEN: Hepatitis B Surface Ag: NONREACTIVE

## 2023-05-13 NOTE — Addendum Note (Signed)
Addended byConrad [place] D on: 05/13/2023 03:05 PM   Modules accepted: Orders

## 2023-05-13 NOTE — Addendum Note (Signed)
Addended by: Conrad [place] D on: 05/13/2023 07:46 AM   Modules accepted: Orders

## 2023-06-19 ENCOUNTER — Other Ambulatory Visit (INDEPENDENT_AMBULATORY_CARE_PROVIDER_SITE_OTHER): Payer: BC Managed Care – PPO

## 2023-06-19 DIAGNOSIS — R739 Hyperglycemia, unspecified: Secondary | ICD-10-CM | POA: Diagnosis not present

## 2023-06-19 DIAGNOSIS — R7989 Other specified abnormal findings of blood chemistry: Secondary | ICD-10-CM | POA: Diagnosis not present

## 2023-06-19 LAB — HEPATIC FUNCTION PANEL
ALT: 88 U/L — ABNORMAL HIGH (ref 0–53)
AST: 60 U/L — ABNORMAL HIGH (ref 0–37)
Albumin: 4.6 g/dL (ref 3.5–5.2)
Alkaline Phosphatase: 162 U/L — ABNORMAL HIGH (ref 39–117)
Bilirubin, Direct: 0.1 mg/dL (ref 0.0–0.3)
Total Bilirubin: 0.6 mg/dL (ref 0.2–1.2)
Total Protein: 7.5 g/dL (ref 6.0–8.3)

## 2023-06-19 LAB — BASIC METABOLIC PANEL
BUN: 15 mg/dL (ref 6–23)
CO2: 30 meq/L (ref 19–32)
Calcium: 9.3 mg/dL (ref 8.4–10.5)
Chloride: 97 meq/L (ref 96–112)
Creatinine, Ser: 0.94 mg/dL (ref 0.40–1.50)
GFR: 92.46 mL/min (ref >60.00)
Glucose, Bld: 329 mg/dL — ABNORMAL HIGH (ref 70–99)
Potassium: 4.5 meq/L (ref 3.5–5.1)
Sodium: 133 meq/L — ABNORMAL LOW (ref 135–145)

## 2023-06-19 LAB — HEMOGLOBIN A1C: Hgb A1c MFr Bld: 11 % — ABNORMAL HIGH (ref 4.6–6.5)

## 2023-06-20 ENCOUNTER — Other Ambulatory Visit: Payer: Self-pay | Admitting: *Deleted

## 2023-06-20 MED ORDER — METFORMIN HCL ER 500 MG PO TB24: 1000.0000 mg | ORAL_TABLET | Freq: Two times a day (BID) | ORAL | 0 refills | Status: DC

## 2023-06-20 MED ORDER — GLIMEPIRIDE 2 MG PO TABS: 2.0000 mg | ORAL_TABLET | Freq: Every day | ORAL | 0 refills | Status: DC

## 2023-08-27 ENCOUNTER — Telehealth: Payer: Self-pay

## 2023-08-27 NOTE — Telephone Encounter (Signed)
Cpap supply order faxed to [facility]

## 2023-08-29 ENCOUNTER — Other Ambulatory Visit: Payer: Self-pay | Admitting: Internal Medicine

## 2023-08-29 DIAGNOSIS — E782 Mixed hyperlipidemia: Secondary | ICD-10-CM

## 2023-09-10 ENCOUNTER — Encounter: Payer: Self-pay | Admitting: Internal Medicine

## 2023-09-10 ENCOUNTER — Ambulatory Visit: Payer: 59 | Admitting: Internal Medicine

## 2023-09-10 VITALS — BP 132/82 | HR 85 | Temp 97.5°F | Ht 70.0 in | Wt 257.8 lb

## 2023-09-10 DIAGNOSIS — E1165 Type 2 diabetes mellitus with hyperglycemia: Secondary | ICD-10-CM | POA: Insufficient documentation

## 2023-09-10 DIAGNOSIS — R7989 Other specified abnormal findings of blood chemistry: Secondary | ICD-10-CM | POA: Diagnosis not present

## 2023-09-10 DIAGNOSIS — E669 Obesity, unspecified: Secondary | ICD-10-CM | POA: Diagnosis not present

## 2023-09-10 DIAGNOSIS — Z7984 Long term (current) use of oral hypoglycemic drugs: Secondary | ICD-10-CM

## 2023-09-10 LAB — GLUCOSE, POCT (MANUAL RESULT ENTRY): POC Glucose: 107 mg/dL — AB (ref 70–99)

## 2023-09-10 LAB — COMPREHENSIVE METABOLIC PANEL
ALT: 86 U/L — ABNORMAL HIGH (ref 0–53)
AST: 55 U/L — ABNORMAL HIGH (ref 0–37)
Albumin: 4.5 g/dL (ref 3.5–5.2)
Alkaline Phosphatase: 79 U/L (ref 39–117)
BUN: 17 mg/dL (ref 6–23)
CO2: 26 meq/L (ref 19–32)
Calcium: 8.9 mg/dL (ref 8.4–10.5)
Chloride: 107 meq/L (ref 96–112)
Creatinine, Ser: 0.9 mg/dL (ref 0.40–1.50)
GFR: 97.25 mL/min (ref >60.00)
Glucose, Bld: 107 mg/dL — ABNORMAL HIGH (ref 70–99)
Potassium: 4.4 meq/L (ref 3.5–5.1)
Sodium: 141 meq/L (ref 135–145)
Total Bilirubin: 0.5 mg/dL (ref 0.2–1.2)
Total Protein: 7.1 g/dL (ref 6.0–8.3)

## 2023-09-10 LAB — HEMOGLOBIN A1C: Hgb A1c MFr Bld: 6.9 % — ABNORMAL HIGH (ref 4.6–6.5)

## 2023-09-10 MED ORDER — LANCET DEVICE MISC: 1.0000 | Freq: Three times a day (TID) | 0 refills | Status: AC

## 2023-09-10 MED ORDER — BLOOD GLUCOSE TEST VI STRP: 1.0000 | ORAL_STRIP | Freq: Three times a day (TID) | 2 refills | Status: AC

## 2023-09-10 MED ORDER — LANCETS MISC. MISC: 1.0000 | Freq: Three times a day (TID) | 2 refills | Status: AC

## 2023-09-10 MED ORDER — BLOOD GLUCOSE MONITORING SUPPL DEVI: 1.0000 | Freq: Three times a day (TID) | 0 refills | Status: AC

## 2023-09-10 NOTE — Assessment & Plan Note (Addendum)
 DM: Last A1c went from  to 5.5 to 11.0, October 2024 the context of morbid obesity, gaining weight, poor diet, running out of metformin. Since then, she is back on metformin, started glimepiride.  No ambulatory CBGs. 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 CBG now: 107.denies low CBG sxs Plan: A1c, further blood results.    Increased LFTs: They remain elevated, hep B serology negative few months ago.  Recheck CMP today. Morbid obesity: Making some progress. RTC 3 months.

## 2023-09-10 NOTE — Progress Notes (Signed)
 Subjective:    Patient ID: Cameron Valenzuela, male    DOB: 08/28/1970, 53 y.o.   MRN: [id number]  DOS:  09/10/2023 Type of visit - description: Follow-up from previous visit  At the last visit, hemoglobin A1C was significantly increased. Since then he is taking medications as prescribed In the last month, was able to change his lifestyle and has lost 5 pounds. No ambulatory CBGs.  He was feeling thirsty before, that stopped. He is drinking plenty of fluids and urinate frequently. Had some blurry vision before but that is improving.  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 Vernie Ammons   CYSTOSCOPY WITH STENT PLACEMENT Left 02/18/2017   Procedure: CYSTOSCOPY/ LEFT RETROGRADE/ WITH LEFT STENT PLACEMENT;  Surgeon: Ihor Gully, MD;  Location: [facility];  Service: Urology;  Laterality: Left;   EXTRACORPOREAL SHOCK WAVE LITHOTRIPSY Left 02/18/2017   Procedure: LEFT EXTRACORPOREAL SHOCK WAVE LITHOTRIPSY (ESWL);  Surgeon: Marcine Matar, MD;  Location: [facility];  Service: Urology;  Laterality: Left;   EXTRACORPOREAL SHOCK WAVE LITHOTRIPSY Left 04/11/2017   Procedure: LEFT EXTRACORPOREAL SHOCK WAVE LITHOTRIPSY (ESWL);  Surgeon: Bjorn Pippin, MD;  Location: [facility];  Service: Urology;  Laterality: Left;   EXTRACORPOREAL SHOCK WAVE LITHOTRIPSY Left 02/23/2021   Procedure: EXTRACORPOREAL SHOCK WAVE LITHOTRIPSY (ESWL);  Surgeon: Crista Elliot, MD;  Location: [facility];  Service: Urology;  Laterality: Left;   LITHOTRIPSY     02/18/17   neck bipsy     beningn, in his 25s    Current Outpatient Medications  Medication Instructions   Blood Glucose Monitoring Suppl DEVI 1 each, Does not apply, 3 times daily, May substitute to any manufacturer covered by patient's insurance.   cholecalciferol (VITAMIN D3) 2,000 Units, Daily   Choline Fenofibrate (FENOFIBRIC ACID) 45 MG CPDR 1 tablet, Oral, Daily   glimepiride (AMARYL) 2 mg, Oral, Daily before breakfast   Glucose Blood (BLOOD GLUCOSE TEST STRIPS) STRP 1 each, In Vitro, 3 times daily, May substitute to any manufacturer covered by [employer].   Lancet Device MISC 1 each, Does not apply, 3 times daily, May substitute to any manufacturer covered by patient's insurance.   Lancets Misc. MISC 1 each, Does not apply, 3 times daily, May substitute to any manufacturer covered by patient's insurance.   metFORMIN (GLUCOPHAGE-XR) 1,000 mg, Oral, 2 times daily with meals       Objective:   Physical Exam BP 132/82   Pulse  85   Temp (!) 97.5 F (36.4 C)   Ht 5\' 10"  (1.778 m)   Wt 257 lb 12.8 oz (116.9 kg)   SpO2 98%   BMI 36.99 kg/m  General:   Well developed, NAD, BMI noted. HEENT:  Normocephalic . Face symmetric, atraumatic Lungs:  CTA B Normal respiratory effort, no intercostal retractions, no accessory muscle use. Heart: RRR,  no murmur.  Lower extremities: no pretibial edema bilaterally  Skin: Not pale. Not jaundice Neurologic:  alert & oriented X3.  Speech normal, gait appropriate for age and unassisted Psych--  Cognition and judgment appear intact.  Cooperative with normal attention span and concentration.  Behavior appropriate. No anxious  or depressed appearing.      Assessment   Assessment DM: A1c 11 (October 2024 Dyslipidemia Seasonal allergies Kidney stones ([facility]) OSA per sleep 2018 , has a CPAP  Rosacea, SCC dx 2019 Obesity  Vit D  def  Increase LFTs: Fatty liver per [place] 2017. Hep C neg 2022.  No EtOH.  Hep B serology - 04-2023  PLAN: DM: Last A1c went from  to 5.5 to 11.0, October 2024 the context of morbid obesity, gaining weight, poor diet, running out of metformin. Since then, she is back on metformin, started glimepiride.  No ambulatory CBGs. 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 CBG now: 107.denies low CBG sxs Plan: A1c, further blood results.    Increased LFTs: They remain elevated, hep B serology negative few months ago.  Recheck CMP today. Morbid obesity: Making some progress. RTC 3 months.

## 2023-09-10 NOTE — Patient Instructions (Signed)
 Diabetes: You can check your sugars at different times  - early in AM fasting  ( blood sugar goal 70-130) - 2 hours after a meal (blood sugar goal less than 180) - bedtime (goal 90-150)       GO TO THE LAB : Get the blood work     Next visit with me in 3 months Please schedule it at the front desk

## 2023-09-13 ENCOUNTER — Telehealth: Payer: Self-pay | Admitting: Emergency Medicine

## 2023-09-13 ENCOUNTER — Other Ambulatory Visit: Payer: Self-pay

## 2023-09-13 DIAGNOSIS — E1165 Type 2 diabetes mellitus with hyperglycemia: Secondary | ICD-10-CM

## 2023-09-13 DIAGNOSIS — E669 Obesity, unspecified: Secondary | ICD-10-CM

## 2023-09-13 DIAGNOSIS — E782 Mixed hyperlipidemia: Secondary | ICD-10-CM

## 2023-09-13 MED ORDER — SEMAGLUTIDE-WEIGHT MANAGEMENT 0.25 MG/0.5ML ~~LOC~~ SOAJ: 0.2500 mg | SUBCUTANEOUS | 0 refills | Status: DC

## 2023-09-13 NOTE — Telephone Encounter (Signed)
 Did we receive PA request for this?

## 2023-09-13 NOTE — Telephone Encounter (Signed)
 Copied from CRM [id number]. Topic: Clinical - Medication Question >> Sep 13, 2023 11:33 AM Alcus Dad wrote: Reason for CRM: Patient stated that he recently go on Wegovy for diabetes and was told by pharmacy was $1200. Patient is requesting a generic version or some help to assist with the cost.

## 2023-09-15 ENCOUNTER — Other Ambulatory Visit: Payer: Self-pay | Admitting: Internal Medicine

## 2023-09-16 ENCOUNTER — Other Ambulatory Visit (HOSPITAL_COMMUNITY): Payer: Self-pay

## 2023-09-16 ENCOUNTER — Telehealth: Payer: Self-pay

## 2023-09-16 ENCOUNTER — Other Ambulatory Visit: Payer: Self-pay | Admitting: Internal Medicine

## 2023-09-16 NOTE — Telephone Encounter (Signed)
 Pharmacy Patient Advocate Encounter   Received notification from Pt Calls Messages that prior authorization for Ozempic (0.25 or 0.5 MG/DOSE) 2MG /3ML pen-injectors  is required/requested.   Insurance verification completed.   The patient is insured through [facility] .   Per test claim: PA required; PA submitted to above mentioned insurance via CoverMyMeds Key/confirmation #/EOC [id number] Status is pending

## 2023-09-16 NOTE — Telephone Encounter (Signed)
 PA request has been Submitted. New Encounter created for follow up. For additional info see Pharmacy Prior Auth telephone encounter from 09/16/2023.

## 2023-09-17 ENCOUNTER — Encounter: Payer: Self-pay | Admitting: Internal Medicine

## 2023-09-17 ENCOUNTER — Other Ambulatory Visit (HOSPITAL_COMMUNITY): Payer: Self-pay

## 2023-09-17 NOTE — Telephone Encounter (Signed)
 Pharmacy Patient Advocate Encounter  Received notification from [facility] that Prior Authorization for Ozempic (0.25 or 0.5 MG/DOSE) 2MG /3ML pen-injectors  has been APPROVED from 09/16/23 to 09/16/26. Ran test claim, Copay is $24.99. This test claim was processed through [employer]- copay amounts may vary at other pharmacies due to pharmacy/plan contracts, or as the patient moves through the different stages of their insurance plan.   PA #/Case ID/Reference #: [id number]

## 2023-10-07 ENCOUNTER — Other Ambulatory Visit (HOSPITAL_BASED_OUTPATIENT_CLINIC_OR_DEPARTMENT_OTHER): Payer: Self-pay

## 2023-10-07 ENCOUNTER — Other Ambulatory Visit: Payer: Self-pay

## 2023-10-07 DIAGNOSIS — E782 Mixed hyperlipidemia: Secondary | ICD-10-CM

## 2023-10-07 DIAGNOSIS — E669 Obesity, unspecified: Secondary | ICD-10-CM

## 2023-10-07 DIAGNOSIS — E1165 Type 2 diabetes mellitus with hyperglycemia: Secondary | ICD-10-CM

## 2023-10-07 MED ORDER — SEMAGLUTIDE-WEIGHT MANAGEMENT 0.25 MG/0.5ML ~~LOC~~ SOAJ
SUBCUTANEOUS | 3 refills | Status: AC
  Filled 2023-10-07 (×2): qty 2, 28d supply, fill #0

## 2023-10-20 ENCOUNTER — Other Ambulatory Visit: Payer: Self-pay | Admitting: Internal Medicine

## 2023-12-10 ENCOUNTER — Ambulatory Visit: Payer: 59 | Admitting: Internal Medicine

## 2023-12-24 ENCOUNTER — Encounter: Payer: Self-pay | Admitting: Internal Medicine

## 2024-03-11 ENCOUNTER — Ambulatory Visit: Payer: Self-pay | Admitting: Psychology

## 2024-03-25 ENCOUNTER — Telehealth: Payer: BC Managed Care – PPO | Admitting: Neurology

## 2024-03-25 DIAGNOSIS — G4733 Obstructive sleep apnea (adult) (pediatric): Secondary | ICD-10-CM

## 2024-03-25 NOTE — Patient Instructions (Signed)
 Great to see you today! Continue CPAP usage minimum 4 hours nightly Continue current settings Continue to replace supplies routinely through DME Follow-up in 1 year or sooner if needed. Thanks!!

## 2024-03-25 NOTE — Progress Notes (Signed)
 Virtual Visit via Video Note  I connected with Cameron Valenzuela on 03/25/24 at  1:45 PM EDT by a video enabled telemedicine application and verified that I am speaking with the correct person using two identifiers.  Location: Patient: in his car Provider: in the office    I discussed the limitations of evaluation and management by telemedicine and the availability of in person appointments. The patient expressed understanding and agreed to proceed.  History of Present Illness: Update March 25, 2024 SS: VV, 30-day CPAP compliance report shows 90% usage greater than 4 hours, 6-18 cmH2O.  Leak is 32, AHI 2.5. Using FFM. Has more standard sleep schedule now at job. Pleased with CPAP. Is aware of CPAP leak, will tighten mask. This is his 2nd CPAP machine, felt like the 1st machine worked better, may just be personal preference. He still has his older machine that he will travel with, I can see usage in Res Med.   Update March 20, 2023 SS: Saw Dr. Chalice in May 2024 as a long-term compliance CPAP patient.  He was seeking a new HST and CPAP machine. HST 12/31/22 showed moderate OSA, Calculated pAHI (per hour): 20.7/h, REM pAHI:17/h, NREM pAHI: 22.3/h.  Start date new CPAP 01/14/23. 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 Sometimes forgot to place CPAP on. Uses FFM, feels leaking but is baseline, no bothersome, has some facial hair. Feels needs more pressure.  Good benefit with CPAP, tolerating well.  Has superb compliance below:  CPAP data 02/10/23-03/11/23 30/30 days at 100%, > 4 hours 97%. 6-16 cm water. AHI 6.0, Leak 25.   11/26/22 Dr. Chalice: Cameron Valenzuela is a 53 y.o. male patient who is here for revisit 11/26/2022 for  CPAP follow up.  He remains a compliant user of CPAP, needs a HST to get a baseline established.  He has a new position, he commutes to [employer] [place]. 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 He is pre diabetic.   Compliance data: [name] is 19-day download shows 80% compliance and this has certainly been affected by his long commute and different lifestyle right now he is on a minimum pressure of 8 maximum pressure setting of 28 cm water was 2 cm EPR and his residual AHI is 5.4.  There is a half brother high air leak which may be related to a need to change mask and headgear.  He also has facial hair.  The 95th percentile pressure is 16.2 cm water.  No central apneas are emerging.  The need for a new CPAP is not in doubt but I like to have a baseline.   Observations/Objective: Via video visit, is in his car, speech is clear and concise, follows commands  Assessment and Plan: 1.  OSA on CPAP (HST 12/31/22 showed moderate OSA, Calculated pAHI (per hour): 20.7/h, REM pAHI:17/h, NREM pAHI: 22.3/h. Start date new CPAP 01/14/23)  -Very good compliance.  Continue current settings 6 to 18 cm water.  Continue nightly usage minimum 4 hours.  AHI is well treated.  There is some mask leak, he will try to tighten the mask.  Continue to work with DME to replace supplies routinely.   Follow Up Instructions: 1 year virtually   I discussed the assessment  and treatment plan with the patient. The patient was provided an opportunity to ask questions and all were answered. The patient agreed with the plan and demonstrated an understanding of the instructions.   The patient was advised to call back or seek an in-person evaluation if the symptoms worsen or if the condition fails to improve as anticipated.  Lauraine Gayland MANDES, DNP  [facility] [address] [place], [place] [address] [phone]

## 2024-04-08 ENCOUNTER — Other Ambulatory Visit: Payer: Self-pay | Admitting: Medical

## 2024-06-30 ENCOUNTER — Other Ambulatory Visit: Payer: Self-pay | Admitting: Internal Medicine

## 2024-08-02 ENCOUNTER — Other Ambulatory Visit: Payer: Self-pay | Admitting: Internal Medicine

## 2024-08-05 ENCOUNTER — Other Ambulatory Visit: Payer: Self-pay | Admitting: Internal Medicine

## 2025-03-31 ENCOUNTER — Telehealth: Admitting: Neurology
